# Patient Record
Sex: Female | Born: 1965 | Race: White | Hispanic: No | Marital: Married | Smoking: Current every day smoker
Health system: Southern US, Community
[De-identification: ages and names within clinical notes are randomized; demographics above are authoritative.]

## PROBLEM LIST (undated history)

## (undated) DIAGNOSIS — K5000 Crohn's disease of small intestine without complications: Secondary | ICD-10-CM

## (undated) DIAGNOSIS — I201 Angina pectoris with documented spasm: Secondary | ICD-10-CM

## (undated) DIAGNOSIS — K922 Gastrointestinal hemorrhage, unspecified: Secondary | ICD-10-CM

## (undated) DIAGNOSIS — E039 Hypothyroidism, unspecified: Secondary | ICD-10-CM

## (undated) DIAGNOSIS — Z720 Tobacco use: Secondary | ICD-10-CM

## (undated) DIAGNOSIS — G4730 Sleep apnea, unspecified: Secondary | ICD-10-CM

## (undated) DIAGNOSIS — F329 Major depressive disorder, single episode, unspecified: Secondary | ICD-10-CM

## (undated) DIAGNOSIS — I10 Essential (primary) hypertension: Secondary | ICD-10-CM

## (undated) DIAGNOSIS — N189 Chronic kidney disease, unspecified: Secondary | ICD-10-CM

## (undated) DIAGNOSIS — D690 Allergic purpura: Secondary | ICD-10-CM

## (undated) DIAGNOSIS — I2510 Atherosclerotic heart disease of native coronary artery without angina pectoris: Secondary | ICD-10-CM

## (undated) DIAGNOSIS — K219 Gastro-esophageal reflux disease without esophagitis: Secondary | ICD-10-CM

## (undated) DIAGNOSIS — J45909 Unspecified asthma, uncomplicated: Secondary | ICD-10-CM

## (undated) DIAGNOSIS — F32A Depression, unspecified: Secondary | ICD-10-CM

## (undated) DIAGNOSIS — G43909 Migraine, unspecified, not intractable, without status migrainosus: Secondary | ICD-10-CM

## (undated) DIAGNOSIS — F909 Attention-deficit hyperactivity disorder, unspecified type: Secondary | ICD-10-CM

## (undated) DIAGNOSIS — F419 Anxiety disorder, unspecified: Secondary | ICD-10-CM

---
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
## 1989-08-06 HISTORY — PX: DILATION AND CURETTAGE OF UTERUS: SHX78

## 1998-01-30 ENCOUNTER — Ambulatory Visit (HOSPITAL_COMMUNITY): Admission: RE | Admit: 1998-01-30 | Discharge: 1998-01-30 | Payer: Self-pay | Admitting: Obstetrics and Gynecology

## 1998-06-24 ENCOUNTER — Inpatient Hospital Stay (HOSPITAL_COMMUNITY): Admission: AD | Admit: 1998-06-24 | Discharge: 1998-06-27 | Payer: Self-pay | Admitting: Obstetrics and Gynecology

## 1998-07-12 ENCOUNTER — Encounter (HOSPITAL_COMMUNITY): Admission: RE | Admit: 1998-07-12 | Discharge: 1998-10-10 | Payer: Self-pay | Admitting: Obstetrics and Gynecology

## 1999-08-05 ENCOUNTER — Other Ambulatory Visit: Admission: RE | Admit: 1999-08-05 | Discharge: 1999-08-05 | Payer: Self-pay | Admitting: Obstetrics and Gynecology

## 1999-11-04 ENCOUNTER — Ambulatory Visit (HOSPITAL_COMMUNITY): Admission: RE | Admit: 1999-11-04 | Discharge: 1999-11-04 | Payer: Self-pay | Admitting: Internal Medicine

## 1999-11-05 ENCOUNTER — Encounter: Payer: Self-pay | Admitting: Emergency Medicine

## 2000-09-16 ENCOUNTER — Other Ambulatory Visit: Admission: RE | Admit: 2000-09-16 | Discharge: 2000-09-16 | Payer: Self-pay | Admitting: Obstetrics and Gynecology

## 2001-02-19 ENCOUNTER — Ambulatory Visit (HOSPITAL_COMMUNITY): Admission: RE | Admit: 2001-02-19 | Discharge: 2001-02-19 | Payer: Self-pay | Admitting: Internal Medicine

## 2001-02-19 ENCOUNTER — Encounter: Payer: Self-pay | Admitting: Internal Medicine

## 2002-03-07 ENCOUNTER — Encounter: Admission: RE | Admit: 2002-03-07 | Discharge: 2002-03-07 | Payer: Self-pay | Admitting: Internal Medicine

## 2002-03-07 ENCOUNTER — Encounter: Payer: Self-pay | Admitting: Internal Medicine

## 2002-03-26 ENCOUNTER — Encounter: Payer: Self-pay | Admitting: Obstetrics and Gynecology

## 2002-03-26 ENCOUNTER — Encounter: Admission: RE | Admit: 2002-03-26 | Discharge: 2002-03-26 | Payer: Self-pay | Admitting: Obstetrics and Gynecology

## 2002-08-29 ENCOUNTER — Encounter: Payer: Self-pay | Admitting: Obstetrics and Gynecology

## 2002-08-29 ENCOUNTER — Encounter: Admission: RE | Admit: 2002-08-29 | Discharge: 2002-08-29 | Payer: Self-pay | Admitting: Obstetrics and Gynecology

## 2002-09-06 ENCOUNTER — Other Ambulatory Visit: Admission: RE | Admit: 2002-09-06 | Discharge: 2002-09-06 | Payer: Self-pay | Admitting: Obstetrics and Gynecology

## 2002-10-16 ENCOUNTER — Encounter (INDEPENDENT_AMBULATORY_CARE_PROVIDER_SITE_OTHER): Payer: Self-pay | Admitting: *Deleted

## 2002-10-16 ENCOUNTER — Ambulatory Visit (HOSPITAL_COMMUNITY): Admission: RE | Admit: 2002-10-16 | Discharge: 2002-10-16 | Payer: Self-pay | Admitting: Obstetrics and Gynecology

## 2003-09-19 ENCOUNTER — Other Ambulatory Visit: Admission: RE | Admit: 2003-09-19 | Discharge: 2003-09-19 | Payer: Self-pay | Admitting: Obstetrics and Gynecology

## 2004-09-18 ENCOUNTER — Encounter: Admission: RE | Admit: 2004-09-18 | Discharge: 2004-09-18 | Payer: Self-pay | Admitting: Internal Medicine

## 2004-10-01 ENCOUNTER — Other Ambulatory Visit: Admission: RE | Admit: 2004-10-01 | Discharge: 2004-10-01 | Payer: Self-pay | Admitting: Obstetrics and Gynecology

## 2005-04-02 ENCOUNTER — Ambulatory Visit: Payer: Self-pay | Admitting: Internal Medicine

## 2005-04-21 ENCOUNTER — Ambulatory Visit (HOSPITAL_BASED_OUTPATIENT_CLINIC_OR_DEPARTMENT_OTHER): Admission: RE | Admit: 2005-04-21 | Discharge: 2005-04-21 | Payer: Self-pay | Admitting: Internal Medicine

## 2005-04-26 ENCOUNTER — Ambulatory Visit: Payer: Self-pay | Admitting: Internal Medicine

## 2005-05-13 ENCOUNTER — Ambulatory Visit: Payer: Self-pay | Admitting: Internal Medicine

## 2005-06-03 ENCOUNTER — Ambulatory Visit: Payer: Self-pay | Admitting: Internal Medicine

## 2005-08-22 ENCOUNTER — Ambulatory Visit (HOSPITAL_COMMUNITY): Admission: RE | Admit: 2005-08-22 | Discharge: 2005-08-22 | Payer: Self-pay | Admitting: Emergency Medicine

## 2005-08-28 ENCOUNTER — Ambulatory Visit (HOSPITAL_COMMUNITY): Admission: RE | Admit: 2005-08-28 | Discharge: 2005-08-28 | Payer: Self-pay | Admitting: Emergency Medicine

## 2005-09-08 ENCOUNTER — Ambulatory Visit (HOSPITAL_COMMUNITY): Admission: RE | Admit: 2005-09-08 | Discharge: 2005-09-08 | Payer: Self-pay | Admitting: Internal Medicine

## 2005-09-09 ENCOUNTER — Encounter: Admission: RE | Admit: 2005-09-09 | Discharge: 2005-09-09 | Payer: Self-pay | Admitting: Internal Medicine

## 2005-09-14 ENCOUNTER — Ambulatory Visit (HOSPITAL_COMMUNITY): Admission: RE | Admit: 2005-09-14 | Discharge: 2005-09-14 | Payer: Self-pay

## 2005-09-15 ENCOUNTER — Inpatient Hospital Stay (HOSPITAL_COMMUNITY): Admission: EM | Admit: 2005-09-15 | Discharge: 2005-09-27 | Payer: Self-pay | Admitting: Emergency Medicine

## 2005-09-15 ENCOUNTER — Ambulatory Visit: Payer: Self-pay | Admitting: Internal Medicine

## 2005-09-16 ENCOUNTER — Encounter (INDEPENDENT_AMBULATORY_CARE_PROVIDER_SITE_OTHER): Payer: Self-pay | Admitting: Specialist

## 2005-09-17 ENCOUNTER — Ambulatory Visit: Payer: Self-pay | Admitting: Internal Medicine

## 2005-09-17 ENCOUNTER — Encounter: Payer: Self-pay | Admitting: Internal Medicine

## 2005-09-17 ENCOUNTER — Ambulatory Visit: Payer: Self-pay | Admitting: Oncology

## 2005-09-18 ENCOUNTER — Ambulatory Visit: Payer: Self-pay | Admitting: Pulmonary Disease

## 2005-10-03 ENCOUNTER — Inpatient Hospital Stay (HOSPITAL_COMMUNITY): Admission: EM | Admit: 2005-10-03 | Discharge: 2005-10-06 | Payer: Self-pay | Admitting: Emergency Medicine

## 2005-11-16 ENCOUNTER — Ambulatory Visit (HOSPITAL_COMMUNITY): Admission: RE | Admit: 2005-11-16 | Discharge: 2005-11-16 | Payer: Self-pay | Admitting: Gastroenterology

## 2005-11-22 ENCOUNTER — Encounter: Admission: RE | Admit: 2005-11-22 | Discharge: 2006-02-20 | Payer: Self-pay | Admitting: Internal Medicine

## 2005-11-23 ENCOUNTER — Other Ambulatory Visit: Admission: RE | Admit: 2005-11-23 | Discharge: 2005-11-23 | Payer: Self-pay | Admitting: Obstetrics and Gynecology

## 2006-02-21 ENCOUNTER — Other Ambulatory Visit: Admission: RE | Admit: 2006-02-21 | Discharge: 2006-02-21 | Payer: Self-pay | Admitting: Obstetrics and Gynecology

## 2006-03-03 ENCOUNTER — Encounter: Admission: RE | Admit: 2006-03-03 | Discharge: 2006-03-03 | Payer: Self-pay | Admitting: Internal Medicine

## 2006-05-09 ENCOUNTER — Ambulatory Visit (HOSPITAL_COMMUNITY): Admission: RE | Admit: 2006-05-09 | Discharge: 2006-05-09 | Payer: Self-pay | Admitting: Internal Medicine

## 2006-08-30 ENCOUNTER — Other Ambulatory Visit: Admission: RE | Admit: 2006-08-30 | Discharge: 2006-08-30 | Payer: Self-pay | Admitting: Obstetrics and Gynecology

## 2006-09-23 ENCOUNTER — Ambulatory Visit (HOSPITAL_COMMUNITY): Admission: RE | Admit: 2006-09-23 | Discharge: 2006-09-23 | Payer: Self-pay | Admitting: Internal Medicine

## 2007-02-27 ENCOUNTER — Other Ambulatory Visit: Admission: RE | Admit: 2007-02-27 | Discharge: 2007-02-27 | Payer: Self-pay | Admitting: Obstetrics and Gynecology

## 2007-05-08 ENCOUNTER — Encounter: Admission: RE | Admit: 2007-05-08 | Discharge: 2007-05-08 | Payer: Self-pay | Admitting: Internal Medicine

## 2007-08-28 ENCOUNTER — Other Ambulatory Visit: Admission: RE | Admit: 2007-08-28 | Discharge: 2007-08-28 | Payer: Self-pay | Admitting: Obstetrics and Gynecology

## 2008-02-29 ENCOUNTER — Other Ambulatory Visit: Admission: RE | Admit: 2008-02-29 | Discharge: 2008-02-29 | Payer: Self-pay | Admitting: Obstetrics and Gynecology

## 2008-05-16 ENCOUNTER — Encounter: Admission: RE | Admit: 2008-05-16 | Discharge: 2008-05-16 | Payer: Self-pay | Admitting: Obstetrics and Gynecology

## 2008-08-19 ENCOUNTER — Inpatient Hospital Stay (HOSPITAL_COMMUNITY): Admission: EM | Admit: 2008-08-19 | Discharge: 2008-08-21 | Payer: Self-pay | Admitting: *Deleted

## 2009-02-20 ENCOUNTER — Emergency Department (HOSPITAL_COMMUNITY): Admission: EM | Admit: 2009-02-20 | Discharge: 2009-02-20 | Payer: Self-pay | Admitting: Family Medicine

## 2009-02-25 ENCOUNTER — Ambulatory Visit: Payer: Self-pay | Admitting: Internal Medicine

## 2009-03-04 ENCOUNTER — Other Ambulatory Visit: Admission: RE | Admit: 2009-03-04 | Discharge: 2009-03-04 | Payer: Self-pay | Admitting: Obstetrics and Gynecology

## 2009-03-18 ENCOUNTER — Ambulatory Visit: Payer: Self-pay | Admitting: Internal Medicine

## 2009-05-27 ENCOUNTER — Encounter: Admission: RE | Admit: 2009-05-27 | Discharge: 2009-05-27 | Payer: Self-pay | Admitting: Internal Medicine

## 2009-07-22 ENCOUNTER — Ambulatory Visit: Payer: Self-pay | Admitting: Internal Medicine

## 2009-08-29 ENCOUNTER — Ambulatory Visit: Payer: Self-pay | Admitting: Internal Medicine

## 2009-12-22 ENCOUNTER — Ambulatory Visit: Payer: Self-pay | Admitting: Internal Medicine

## 2010-01-05 ENCOUNTER — Ambulatory Visit: Payer: Self-pay | Admitting: Internal Medicine

## 2010-02-23 ENCOUNTER — Ambulatory Visit: Payer: Self-pay | Admitting: Internal Medicine

## 2010-03-03 ENCOUNTER — Ambulatory Visit: Payer: Self-pay | Admitting: Internal Medicine

## 2010-03-03 ENCOUNTER — Other Ambulatory Visit: Admission: RE | Admit: 2010-03-03 | Discharge: 2010-03-03 | Payer: Self-pay | Admitting: Obstetrics and Gynecology

## 2010-06-25 ENCOUNTER — Ambulatory Visit: Payer: Self-pay | Admitting: Internal Medicine

## 2010-06-25 ENCOUNTER — Encounter: Admission: RE | Admit: 2010-06-25 | Discharge: 2010-06-25 | Payer: Self-pay | Admitting: Internal Medicine

## 2010-08-17 ENCOUNTER — Ambulatory Visit: Payer: Self-pay | Admitting: Internal Medicine

## 2010-08-25 ENCOUNTER — Ambulatory Visit (HOSPITAL_COMMUNITY): Admission: RE | Admit: 2010-08-25 | Discharge: 2010-08-25 | Payer: Self-pay | Admitting: Obstetrics & Gynecology

## 2010-09-10 ENCOUNTER — Ambulatory Visit: Payer: Self-pay | Admitting: Internal Medicine

## 2010-12-14 ENCOUNTER — Ambulatory Visit
Admission: RE | Admit: 2010-12-14 | Discharge: 2010-12-14 | Payer: Self-pay | Source: Home / Self Care | Attending: Internal Medicine | Admitting: Internal Medicine

## 2010-12-26 ENCOUNTER — Encounter: Payer: Self-pay | Admitting: Internal Medicine

## 2011-01-29 ENCOUNTER — Ambulatory Visit (INDEPENDENT_AMBULATORY_CARE_PROVIDER_SITE_OTHER): Payer: Commercial Managed Care - PPO | Admitting: Internal Medicine

## 2011-01-29 DIAGNOSIS — N912 Amenorrhea, unspecified: Secondary | ICD-10-CM

## 2011-02-18 LAB — CBC
HCT: 45.2 % (ref 36.0–46.0)
Hemoglobin: 15.3 g/dL — ABNORMAL HIGH (ref 12.0–15.0)
MCH: 32.5 pg (ref 26.0–34.0)
MCHC: 33.8 g/dL (ref 30.0–36.0)
MCV: 96.2 fL (ref 78.0–100.0)
Platelets: 175 10*3/uL (ref 150–400)
RBC: 4.7 MIL/uL (ref 3.87–5.11)
RDW: 12.5 % (ref 11.5–15.5)
WBC: 12 10*3/uL — ABNORMAL HIGH (ref 4.0–10.5)

## 2011-03-19 ENCOUNTER — Ambulatory Visit (INDEPENDENT_AMBULATORY_CARE_PROVIDER_SITE_OTHER): Payer: Commercial Managed Care - PPO | Admitting: Internal Medicine

## 2011-03-19 ENCOUNTER — Other Ambulatory Visit: Payer: Self-pay | Admitting: Internal Medicine

## 2011-03-19 DIAGNOSIS — F411 Generalized anxiety disorder: Secondary | ICD-10-CM

## 2011-03-19 DIAGNOSIS — M542 Cervicalgia: Secondary | ICD-10-CM

## 2011-03-19 DIAGNOSIS — F988 Other specified behavioral and emotional disorders with onset usually occurring in childhood and adolescence: Secondary | ICD-10-CM

## 2011-03-19 DIAGNOSIS — M5410 Radiculopathy, site unspecified: Secondary | ICD-10-CM

## 2011-03-19 DIAGNOSIS — I10 Essential (primary) hypertension: Secondary | ICD-10-CM

## 2011-03-19 DIAGNOSIS — M79602 Pain in left arm: Secondary | ICD-10-CM

## 2011-03-24 ENCOUNTER — Inpatient Hospital Stay (HOSPITAL_COMMUNITY): Admission: RE | Admit: 2011-03-24 | Payer: Commercial Managed Care - PPO | Source: Ambulatory Visit

## 2011-03-26 ENCOUNTER — Ambulatory Visit (HOSPITAL_COMMUNITY)
Admission: RE | Admit: 2011-03-26 | Discharge: 2011-03-26 | Disposition: A | Discharge status: Home / Self Care | Payer: 59 | Source: Ambulatory Visit | Attending: Internal Medicine | Admitting: Internal Medicine

## 2011-03-26 DIAGNOSIS — M5410 Radiculopathy, site unspecified: Secondary | ICD-10-CM

## 2011-03-26 DIAGNOSIS — M519 Unspecified thoracic, thoracolumbar and lumbosacral intervertebral disc disorder: Secondary | ICD-10-CM | POA: Insufficient documentation

## 2011-03-26 DIAGNOSIS — M47812 Spondylosis without myelopathy or radiculopathy, cervical region: Secondary | ICD-10-CM | POA: Insufficient documentation

## 2011-03-26 DIAGNOSIS — M542 Cervicalgia: Secondary | ICD-10-CM | POA: Insufficient documentation

## 2011-03-26 DIAGNOSIS — M25519 Pain in unspecified shoulder: Secondary | ICD-10-CM | POA: Insufficient documentation

## 2011-03-26 DIAGNOSIS — M5412 Radiculopathy, cervical region: Secondary | ICD-10-CM | POA: Insufficient documentation

## 2011-03-26 DIAGNOSIS — M79602 Pain in left arm: Secondary | ICD-10-CM

## 2011-03-26 DIAGNOSIS — R209 Unspecified disturbances of skin sensation: Secondary | ICD-10-CM | POA: Insufficient documentation

## 2011-03-26 DIAGNOSIS — M4802 Spinal stenosis, cervical region: Secondary | ICD-10-CM | POA: Insufficient documentation

## 2011-03-26 DIAGNOSIS — M79609 Pain in unspecified limb: Secondary | ICD-10-CM | POA: Insufficient documentation

## 2011-03-26 DIAGNOSIS — J3489 Other specified disorders of nose and nasal sinuses: Secondary | ICD-10-CM | POA: Insufficient documentation

## 2011-04-20 NOTE — H&P (Signed)
NAME:  [name], [name] NO.:  [id number]   MEDICAL RECORD NO.:  [id number]          PATIENT TYPE:  INP   LOCATION:  1825                         FACILITY:  [facility]   PHYSICIAN:  Vania Rea, M.D. DATE OF BIRTH:  1966/09/04   DATE OF ADMISSION:  08/19/2008  DATE OF DISCHARGE:                              HISTORY & PHYSICAL   PRIMARY CARE PHYSICIAN:  Dr. Marlan Palau.   GASTROENTEROLOGIST:  Dr. Matthias Hughs with [facility].   CHIEF COMPLAINT:  Vomiting and diarrhea since yesterday.   HISTORY OF PRESENT ILLNESS:  This is a 45 year old Caucasian lady who  works as a Engineer, civil (consulting) in urgent care and has a history of vasculitis and  previous admission 2 years ago for ileocolitis related to her  vasculitis.  Has been fairly well until a week ago when she developed an  upper respiratory infection, went to see her primary care physician who  prescribed Biaxin as well as Advair and albuterol MDI and gave her 5-day  steroid taper.  Steroid taper was completed yesterday but yesterday also  patient began to feel nauseous while at work and having some epigastric  pain.  She subsequently went home and began having diarrhea which  proceeded to watery mucusy and bloody diarrhea associated with  persistent vomiting.  Patient came to the emergency room and continued  to have vomiting and bloody diarrhea in the emergency room and was noted  to be markedly dehydrated.  A hospitalists service was called to assist  in management.  Of note, the patient has a co-worker who has also been  complaining of nausea.  Lives with husband and children and they are not  having any GI symptoms.  So far, patient has had 3-1/2 L of saline and  is feeling much improved.  She has noted no fever, no chest pains, no  shortness of breath, no cough, no dysuria, no joint pain.   PAST MEDICAL HISTORY:  1. Hypertension.  2. Hypothyroidism.  3. Vasculitis.  4. GERD.   MEDICATIONS:  1. Enalapril 10 mg daily.  2.  Synthroid 75 mcg daily.  3. Prilosec 20 mg twice daily.  4. Biaxin 500 mg twice daily.  5. Albuterol MDI 4 times daily p.r.n.  6. Advair by inhalation b.i.d.   ALLERGIES:  No known drug allergies.   SOCIAL HISTORY:  Lives with husband and children.  Works as a Engineer, civil (consulting) in  urgent care.  Smokes 1/2 pack-per-day.  Denies alcohol or illicit drug  use.   FAMILY HISTORY:  No family history of GI cancers.   REVIEW OF SYSTEMS:  Other than noted above, a 10-point review of systems  is unremarkable.   PHYSICAL EXAMINATION:  Pleasant young Caucasian lady lying in the  stretcher in no acute distress at this time.  VITAL SIGNS:  Temperature is 97.3.  Pulse 82.  Respiration 18.  Blood  pressure 99/59.  She is saturating at 96% on room air.  Pupils are round and equal.  Mucous membranes pink and anicteric.  She  is somewhat __________.  She has no cervical lymphadenopathy or  thyromegaly.  No jugular venous distention.  CHEST:  She has diffuse  mild rhonchi bilaterally.  CARDIOVASCULAR SYSTEM:  Regular rhythm.  ABDOMEN:  Soft and nontender.  She has increased bowel sounds.  EXTREMITIES:  Without edema.  She has 2+ pulses bilaterally.  CENTRAL NERVOUS SYSTEM:  Cranial nerves II through XII are grossly  intact.  She has not focal neurological deficits.  SKIN:  On her skin, she has no evidence of purpura or other vasculitic  manifestations.   LABORATORY:  Her white count is 27,000.  Hemoglobin is 16.9.  Hematocrit  51.5.  MCV 86.8.  Platelets 269.  She has 90% neutrophils.  Absolute  reticulocyte count 24.6.  Her sodium is 135, potassium 3.4, chloride  106, CO2 20, BUN 21, creatinine 0.8 and liver function test  unremarkable.  Her glucose is 142.  Her lipase is 22.   ASSESSMENT:  1. Gastroenteritis.  Differential includes vasculitic gastroenteritis.      Also includes Clostridium difficile because of her occupation and      also includes food poisoning or other causes.  2. She has acute  bronchitis.  3. Leukocytosis, possibly due to her infective or inflammatory colitis      but possibly due to her recent steroid use.  4. History of hypothyroidism.  5. History of gastroesophageal reflux disease.  6. History of hypertension.   PLAN:  We will admit her to stepdown for continued IV fluid hydration  replacement, monitor H and H and transfuse as necessary.  We will  withhold antibiotics and steroids at this time until she has been seen  by a gastroenterologist.  We will do a stool workup including C. diff  and cultures.  Other plans as per orders.      Vania Rea, M.D.  Electronically Signed     LC/MEDQ  D:  08/19/2008  T:  08/19/2008  Job:  [id number]   cc:   [name], M.D.  Bernette Redbird, M.D.

## 2011-04-20 NOTE — Consult Note (Signed)
NAME:  [name], [name] NO.:  [id number]   MEDICAL RECORD NO.:  [id number]          PATIENT TYPE:  INP   LOCATION:  2919                         FACILITY:  [facility]   PHYSICIAN:  Danise Edge, M.D.   DATE OF BIRTH:  1965-12-29   DATE OF CONSULTATION:  08/19/2008  DATE OF DISCHARGE:                                 CONSULTATION   REFERRING PHYSICIAN:  Bernette Redbird, MD   PROBLEMS:  Acute nausea, vomiting, and bloody diarrhea.   HISTORY:  [name] is a 45 year old female born on  [birth year].  [name] was admitted to [facility] to  evaluate and treat acute vomiting with bloody diarrhea.  One week prior  to admission, she was prescribed Biaxin, Advair, albuterol, and a  steroid taper to treat an upper respiratory tract infection associated  with wheezing.   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   [name] works at the [employer].  She ate a takeout while at work yesterday.  After her shift  completed, she drove home and first developed a rather intense  epigastric discomfort which was followed by nausea, nonbloody vomiting,  and diarrhea associated with pinkish fluid and mucus.   Since admission to the hospital and receiving intravenous fluids, she is  feeling much better.  Her pain has resolved.  Her nausea and vomiting  has resolved.  Her diarrhea is less frequent.   In 2006, she did undergo an esophagogastroduodenoscopy and colonoscopy  performed by Dr. Vida Rigger, which revealed a normal upper endoscopy and  inflamed terminal ileum which did not reveal granulomas by biopsy.  There was no colorectal neoplasia.   [name]' CBC revealed hemoconcentration and a marked  elevation in her white blood cell count.   ALLERGIES TO MEDICATIONS:  None.   CURRENT MEDICATIONS:  Protonix, morphine, fentanyl, and Zofran.   PAST MEDICAL HISTORY:  1. Mild obstructive sleep apnea.  2. Resolved Henoch-Schonlein purpura, 2006.  3. Gastroesophageal reflux disease.  4. Asthma.  5. Migraine syndrome.  6. Normal esophagogastroduodenoscopy, 2006.  7. Inflamed terminal ileum by colonoscopy in 2006 due to Henoch-      Schonlein purpura.  8. Appendectomy.  9. Three cesarean sections.  10.Hysteroscopy with polypectomy.  11.Hypothyroidism.  12.Hypertension.   HABITS:  The patient smokes 1/2-pack of cigarettes per day, but does not  consume alcohol.   PHYSICAL EXAMINATION:  GENERAL APPEARANCE:  The patient appears quite  comfortable.  She is alert and oriented.  HEENT:  Nonicteric sclerae.  LUNGS:  Clear to auscultation.  CARDIAC:  Regular rhythm without murmurs.  ABDOMEN:  Soft, flat, and nontender.   ASSESSMENT:  1. Acute epigastric pain with vomiting and bloody diarrhea with mucus,      rule out infectious process.  2. Resolved Henoch-Schonlein purpura with nephropathy, ileitis, and      leukoclastic vasculitis in 2006.   RECOMMENDATIONS:  1. The patient does not require any diagnostic gastrointestinal      endoscopy at this time.  2. Appropriate stool studies have been ordered and I await their      results.           ______________________________  Danise Edge, M.D.     MJ/MEDQ  D:  08/19/2008  T:  08/20/2008  Job:  [id number]   cc:   Bernette Redbird, M.D.

## 2011-04-20 NOTE — Discharge Summary (Signed)
NAMEMICHELE, [name]     ACCOUNT NO.:  [id number]   MEDICAL RECORD NO.:  [id number]          PATIENT TYPE:  INP   LOCATION:  3017                         FACILITY:  [facility]   PHYSICIAN:  Isidor Holts, M.D.  DATE OF BIRTH:  08-Mar-1966   DATE OF ADMISSION:  08/19/2008  DATE OF DISCHARGE:  08/21/2008                               DISCHARGE SUMMARY   PRIMARY MEDICAL DOCTOR:  Dr. Luanna Cole. Baxley.   PRIMARY GASTROENTEROLOGIST:  Dr. Matthias Hughs.   DISCHARGE DIAGNOSES:  1. Acute gastroenteritis, secondary to Clostridium difficile.  2. Mild dehydration/volume depletion, secondary to #1.  3. Recent acute bronchitis.  4. Dysthyroidism.  5. History of hypertension.  6. History of Henoch-Schonlein purpura.  7. Gastroesophageal reflux disease.  8. Mild obstructive sleep apnea syndrome.  9. Probable history of bronchial asthma/chronic obstructive pulmonary      disease.  10.Migraines.   DISCHARGE MEDICATIONS:  1. Synthroid 75 mcg p.o. daily.  2. Prilosec 20 mg p.o. b.i.d.  3. Advair Diskus (100/50) one puff b.i.d.  4. Albuterol MDI p.r.n. q.i.d.  5. Enalapril 10 mg p.o. daily, to be recommenced on August 26, 2008.  Otherwise as indicated by primary M.D.  6. Flagyl 500 mg p.o. t.i.d. for 10 days only.   Note:  Prednisone and Biaxin have been discontinued.   PROCEDURE:  Abdominal/chest x-ray dated 08/19/2008.  This showed no  acute cardiopulmonary process or evidence for bowel  obstruction/perforation.   CONSULTATIONS:  Dr. Danise Edge, gastroenterologist.   ADMISSION HISTORY:  As in H&P notes of 08/19/2008, dictated by Dr.  Vania Lam.  However, in brief, this is a 45 year old female, with  known history of hypertension, hypothyroidism, Henoch-Schonlein purpura  in the past, GERD, obstructive sleep apnea syndrome, probable bronchial  asthma, migraines, presenting with vomiting and bloody diarrhea.  Reportedly, about a week prior to admission, she had developed  an upper  respiratory tract infection and was managed with a combination of  bronchodilators, Biaxin and steroid taper by her primary M.D.  She was  admitted further evaluation, investigation and management.   CLINICAL COURSE:  1. Acute gastroenteritis.  This was secondary to C difficile.  The      patient appeared volume depleted/dehydrated at time of initial      presentation with a BUN of 21, creatinine 0.8, BP 99/59.  She was      managed with intravenous fluid hydration with satisfactory clinical      response.  By 08/20/2008, BP had normalized at 121/56.  Hydration      status was satisfactory.  BUN was 9, creatinine 0.7.  In view of      above, pre-admission antihypertensive medication was held.  Stool      studies were sent off and were positive for C. difficile toxin.      The patient was therefore commenced on oral Flagyl on 08/21/2008,      to complete a 10-day course.   1. Dysthyroidism.  The patient was continued on pre-admission dosage      of Synthroid.   1. GERD.  The patient was managed with proton pump inhibitor.  There  were no problems referable to this.   1. Recent acute bronchitis.  The patient's chest x-ray was devoid of      any acute changes, and there were no respiratory tract issues      during the course of the patient's hospitalization.  Likely she had      a recent acute bronchitis, which has since resolved.  Biaxin has,      therefore, been discontinued.   1. History of migraines.  There were no problems referable to this.   DISPOSITION:  The patient was, on 08/21/2008 asymptomatic for 24 hours,  was tolerating oral intake, including food and drink, without any  deleterious effects and was very keen to be discharged.  She was,  therefore, discharged accordingly.  She is recommended to return to  regular duties on 08/24/2008.   DIET:  Heart healthy/low residue.   ACTIVITY:  As tolerated.  However, recommended to increase activity  slowly.    FOLLOW-UP INSTRUCTIONS:  The patient is recommended to return to her  primary M.D., Dr. Luanna Cole. [place], routinely, per prior scheduled  appointment.   SPECIAL INSTRUCTIONS:  The patient has been recommended to hold her  Enalapril until 08/26/2008, unless blood pressure shows an increase  before then.  All this has been communicated to the patient.  She has  verbalized understanding.      Isidor Holts, M.D.  Electronically Signed     CO/MEDQ  D:  08/21/2008  T:  08/21/2008  Job:  [id number]   cc:   [name], M.D.

## 2011-04-23 NOTE — Op Note (Signed)
[name], [name]     ACCOUNT NO.:  [id number]   MEDICAL RECORD NO.:  [id number]          PATIENT TYPE:  INP   LOCATION:  6741                         FACILITY:  [facility]   PHYSICIAN:  Petra Kuba, M.D.    DATE OF BIRTH:  1966-01-11   DATE OF PROCEDURE:  09/16/2005  DATE OF DISCHARGE:                                 OPERATIVE REPORT   PROCEDURE:  Esophagogastroduodenoscopy.   ENDOSCOPIST:  Petra Kuba, M.D.   INDICATION:  Upper abdominal pain, GI bleeding.  Consent was signed after  risks, benefits, methods and options were thoroughly discussed prior to any  premeds given.   MEDICINES USED:  Demerol 75 mg, Versed 7.5 mg.   PROCEDURE:  The video endoscope was inserted by direct vision; her esophagus  was normal.  She did have a liter of fluid in her stomach which almost all  was suctioned prior to proceeding with the endoscopy.  Once we suctioned all  the fluid, we went ahead and advanced through a normal antrum and normal  pylorus.  The duodenal bulb was moderately inflamed.  There was some  erythema, but no obvious ulceration.  Scope passed around this area to a  normal second portion of the duodenum.  Scope was withdrawn back to the  bulb; no frank ulceration was seen, but some moderate inflammation, as  mentioned above; photo-documentation was obtained.  The stomach was  evaluated on straight and retroflexed visualization.  We could not suction  all of the liquid, so lesions underlying could have been missed, but no  obvious lesion was seen.  She did not like having the scope in her mouth and  we quickly finished the endoscopy.  A quick look at the esophagus was  normal.  Scope was removed.  The patient tolerated the procedure fair.  There was no obvious immediate complication.   ENDOSCOPIC DIAGNOSES:  1.  Small hiatal hernia.  2.  Increased food and liquid in the stomach, most of it suctioned,      approximately 1 L.  3.  Moderate bulbitis.  4.  Otherwise  normal esophagogastroduodenoscopy.   PLAN:  Proceed with a colonoscopy, continue pump inhibitors; please see that  dictation for details.           ______________________________  Petra Kuba, M.D.     MEM/MEDQ  D:  09/16/2005  T:  09/17/2005  Job:  [id number]   cc:   [name], M.D.  Fax: [phone]   Areatha Keas, M.D.  Fax: [phone]

## 2011-04-23 NOTE — H&P (Signed)
NAME:  [name], [name]     ACCOUNT NO.:  [id number]   MEDICAL RECORD NO.:  [id number]          PATIENT TYPE:  INP   LOCATION:  2004                         FACILITY:  [facility]   PHYSICIAN:  [name], M.D.   DATE OF BIRTH:  [birth year]   DATE OF ADMISSION:  10/03/2005  DATE OF DISCHARGE:                                HISTORY & PHYSICAL   BRIEF HISTORY:  This 45 year old female was discharged from [facility] September 27, 2005 after having been admitted September 15, 2005 with  acute severe abdominal pain and vasculitis.  Back in mid-September 2006, she  developed desquamation of her fingers after her palms turned red and itchy.  The same thing happened to the soles of her feet.  She developed pain in her  wrist.  She was seen by Dr. Merlyn Lot, hand surgeon, who thought she had carpal  tunnel syndrome and placed her on prednisone.  I thought she was having an  allergic reaction to Wellbutrin and stopped the medication.  She  subsequently developed purpuric lesions on her lower extremities.  I had her  seen by dermatologist Dr. 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  The patient was placed  again on prednisone.  The only new medications she has been on recently  since June 2006 are Wellbutrin for fatigue and depression and Vivactil for  daytime somnolence and mild sleep apnea.  During the hospitalization of  September 15, 2005, she was seen by Dr. Ewing Schlein and underwent a colonoscopy, and  biopsy showed according to the pathologist and the GI physician ischemia  more than inflammation.  They noted she had terminal ileitis on CT scan.  There is no history of inflammatory bowel disease and no family history of  inflammatory bowel disease.  On September 17, 2005, she developed rectal  bleeding and severe abdominal pain.  All consulting physicians were worried  about ischemic bowel.  She had an MRA that showed no evidence of  thrombosis  in the major GI vessels.  She was transferred to the intensive care unit.  Hematology consult was obtained.  She was given IVIG to help with the  vasculitic process.  Was not felt to be in DIC.  She never required  transfusion.  Hemoglobin stabilized.  Bleeding continued for several days  but lessened in amount.  She was seen by critical care physicians.  She was  managed with normal saline IV.  She was placed on high-dose steroids, Solu-  Medrol 80 mg q.8 h.  When she was admitted, she had been on Solu-Medrol 80  mg daily.  Slowly, the patient began to improve, and she was transferred out  of the ICU and to a telemetry floor.  Her diet was advanced very slowly, and  she did well.  She had a Dilaudid PCA pump for abdominal pain.  All of this  frightened her.  She was quite alarmed by any change in her clinical  condition.  She developed some pain in her right elbow.  An MRI was done and  showed an effusion.  She developed pain and  swelling in her right calf, and  Doppler to the lower extremities showed no evidence of DVT.  She definitely  had tenderness in the medial right calf and noticeable swelling.  She was  only given one dose of Lovenox overnight until the lower extremity Doppler  could be obtained.  Ultrasound was negative for gallstones.  On admission.  October 03, 2005, she has mild elevation in SGOT and SGPT.  She had been  discharged home September 27, 2005 on prednisone 60 mg daily.  Local  rheumatologist, Dr. Areatha Keas, had agreed to see her on October 06, 2005.  He was in contact with me by telephone when I would call for advice, but  actually consulted on her only once, September 17, 2005.  He did not feel the  patient needed Cytoxan but felt that she would probably improve on  prednisone.  However, on the day of admission, October 03, 2005, she  awakened with some rather severe abdominal pain but had no GI bleeding to  her knowledge.  She had 2 very loose brown  large bowel movements.  She came  to the emergency room in the early morning on October 03, 2005 and was seen  by Dr. Carleene Cooper, ER physician.  He started IV fluids consisting of  normal saline, spoke with the radiologist, who felt another CT of the  abdomen was indicated.  Prior to her discharge on September 25, 2005, we had  done another CT, and it was essentially normal.  This CT done on October 03, 2005 was markedly abnormal.  The radiologist called me to say it showed  terminal ileitis and a diffuse colitis.  She also had marked ascites.  Previous CT when she was admitted on September 15, 2005 showed some mild  ascites and terminal ileitis, but no significant colitis.  Interestingly,  her sed rate on this admission is 25 and had been 0 on her previous  admission.  The patient says that she has had some arthralgias over the past  week.  She actually called the office a couple of times complaining of some  arthralgias in her knees.  She appears to have an effusion in her left knee.  None of the joints are warm or hot.  The rash on her lower extremities seems  a little more diffuse to me.  She had called earlier complaining that the  rash was brighter.  We had checked with a rheumatologist, who indicated as  long as she was not actively bleeding or had more abdominal pain, that it  was probably okay to continue with oral prednisone as an outpatient.  While  in the hospital recently, between September 15, 2005 and September 27, 2005, GI  physicians had placed her on Asacol, but that had been discontinued prior to  her discharge from the hospital.  She was on IV Cipro and IV Flagyl as of  September 17, 2005, and that was continued as an outpatient.  Clostridium  difficile toxin is ordered and is pending.  We have tried to call [facility] today to see if we could transfer her from the emergency  department to their GI medicine service, but presently there is a waiting list, as no beds are  available.  This had been recommended by infectious  disease consultant previously if patient continued to lack improvement.  Her  white blood cell count is within normal limits.  Her potassium on admission  was 3.2, and we have  given her 2 runs of IV potassium 10 mEq/hour for 2  hours.  If necessary, we will hold her overnight until we can hear back from  [facility].           ______________________________  [name], M.D.     MJB/MEDQ  D:  10/04/2005  T:  10/04/2005  Job:  [id number]

## 2011-04-23 NOTE — Consult Note (Signed)
NAME:  [name], [name]     ACCOUNT NO.:  [id number]   MEDICAL RECORD NO.:  [id number]          PATIENT TYPE:  EMS   LOCATION:  MAJO                         FACILITY:  [facility]   PHYSICIAN:  Shirley Friar, MDDATE OF BIRTH:  08-Sep-1966   DATE OF CONSULTATION:  10/03/2005  DATE OF DISCHARGE:                                   CONSULTATION   REQUESTING PHYSICIAN:  [name], M.D.   HISTORY OF PRESENT ILLNESS:  This patient is seen at the request of Dr.  Lenord Fellers for abdominal pain and diarrhea.  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  She  was discharged in improved condition on prednisone 60 mg p.o. daily.  She  reports that she was doing fairly well despite the persistence of the  vasculitic skin lesions up until this morning when she had acute onset of  dull achy abdominal pain along with two loose and watery stools.  There was  no obvious blood in these stools.  Due to the recurrence of her abdominal  pain and diarrhea she came into the emergency department for further  evaluation.  She was set up to see rheumatology as an outpatient and also  was set up for an outpatient visit to [facility] for further  management, but she states that an appointment was not available until  December.  On her presentation here she was afebrile with a temperature of  98.3.  She had a white blood count of 9.3, hemoglobin 13, and platelet count  244.  She has had an abdominal CT scan with contrast here in the emergency  department which showed a severe acute colitis and ileitis with edema, wall  thickening, and wall enhancement.  Her SMV and SMA were noted to be patent  and there was no obstruction, free air, or pneumatosis noted.  During her  previous hospitalization she underwent a colonoscopy and upper endoscopy by  Dr. Ewing Schlein and she had biopsies  done of her terminal ileum which were  concerning for ischemia versus inflammation.  Colonoscopy showed terminal  ileum inflammation and ulceration, otherwise normal colonoscopy with a fair  prep.  Her upper endoscopy showed moderate bulbitis and otherwise normal  EGD.   PAST MEDICAL HISTORY:  1.  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  2.  History of gastrointestinal bleed secondary to #1.  3.  Hypertension.  4.  Gastroesophageal reflux disease.  5.  Terminal ileitis.  6.  History of asthma.  7.  Sleep apnea.   PHYSICAL EXAMINATION:  VITAL SIGNS:  Temperature 98.3, pulse 104, blood  pressure 144/64, respirations 18.  GENERAL:  Alert and oriented, in no acute distress.  SKIN:  Diffuse vasculitic lesions, nonblanching on her extremities and  abdomen.  HEENT:  Nonicteric sclerae.  Oropharynx clear.  NECK:  No lymphadenopathy.  CHEST:  Clear to auscultation bilaterally.  CARDIOVASCULAR:  Regular rate and rhythm without murmurs.  ABDOMEN:  Tender in right lower quadrant, otherwise nontender, positive  guarding in right lower quadrant.  Otherwise no guarding, no rebound, soft,  nondistended, active bowel sounds.  EXTREMITIES:  No pitting edema.   LABORATORY DATA:  White blood count 9.3, hemoglobin 13, hematocrit 38,  platelet count 244.  Sodium 136, potassium 3.2, chloride 104, CO2 27, BUN  13, creatinine 0.7, glucose 106.  Calcium 7.5, total protein 4.5, albumin  1.8, AST 64, ALT 83, ALP 49, T bili 0.6, lipase 19.   Abdominal CT scan showed severe acute colitis and ileitis with edema, wall  thickening, and wall enhancement.  Patent SMV and SMA.  No obstruction, free  air, or pneumatosis noted.   ASSESSMENT:  A 45 year old white female with past medical history as stated  above, presents with acute onset of abdominal pain and diarrhea concerning  for ischemia from her vasculitis.  Based on abdominal CT scan she has acute  colitis and ileitis with patent large vessels  noted.  I agree with high dose  IV steroids.  I doubt that mesalamine would help in this situation in the  acute setting and since ischemia with inflammation has been more prevalent  previously would continue the high dose of IV steroids.  Budesonide may be  an option, when the patient is transitioned off of the IV steroids, but  would be premature to start budesonide now.  Agree with the desire by Dr.  Lenord Fellers to transfer the patient to [facility] for more specialized care.  The  patient currently appears stable for transfer.  If her condition worsens  then would consider getting an MRA, otherwise we will continue to follow on  high dose IV steroids.  Will follow in consultation with Dr. Lenord Fellers.  If the  patient is admitted to [facility] instead of transfer to [facility] then  Dr. Evette Cristal will continue to see the patient in consultation this week.  No  indication for repeat colonoscopy at this time.  Would continue current  management as stated above.      Shirley Friar, MD  Electronically Signed     VCS/MEDQ  D:  10/03/2005  T:  10/03/2005  Job:  [id number]   cc:   [name], M.D.  Fax: [phone]

## 2011-04-23 NOTE — Consult Note (Signed)
NAME:  [name], [name]     ACCOUNT NO.:  [id number]   MEDICAL RECORD NO.:  [id number]          PATIENT TYPE:  INP   LOCATION:  2004                         FACILITY:  [facility]   PHYSICIAN:  Mindi Slicker. Lowell Guitar, M.D.  DATE OF BIRTH:  December 07, 1965   DATE OF CONSULTATION:  10/04/2005  DATE OF DISCHARGE:                                   CONSULTATION   I was asked by Dr. 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 She underwent skin biopsy in October  which demonstrated evidence of leukocytoblastic vasculitis. In addition, the  patient had the onset of abdominal pain in October and had rectal bleeding  as well with radiographic evidence of terminal ileitis by CT scan and biopsy  of the terminal ileum demonstrated features consistent with ischemic  necrosis. The patient is also complaining of some swelling on top of her  head, foamy urine, and the onset yesterday of tea-colored urine. She has  been on corticosteroids for several weeks. Urinalysis demonstrates too  numerous to count red blood cells and therefore nephrology consultation has  been requested. The patient recently took Vivactin and this was felt  possibly to be implicated in precipitating a hand rash which occurred also  in early September.   CURRENT MEDICATIONS:  1.  Solu-Medrol 80 mg IV q.6h.  2.  IV Protonix 40 mg q.12h.  3.  Dilaudid PCA for abdominal pain.  4.  Tylenol.  5.  Magic Mouthwash.   PAST MEDICAL HISTORY:  1.  Status post Cesarean section times three.  2.  Status post appendectomy.  3.  History of sleep apnea.  4.  Obesity.  5.  Glucose intolerance exacerbated by steroids.   PHYSICAL EXAMINATION:  VITAL SIGNS: Blood pressure 130/83, O2 saturation  96%, heart rate 89.  GENERAL: This is a very pleasant,  mildly obese female.  SKIN: There is hyperpigmented purpuric lesions of a circular nature in her  lower extremities. Each one of these lesions varies in size, approximately 1  cm or less. The lesions are in the dependent distribution, worse in the  distal lower extremities extending up to the lower waist area. There are  also a few ecchymosis in her upper extremities, but these appear to be  mainly traumatic although there are some small areas that appear purpuric.  HEENT: Normocephalic and atraumatic. Extraocular movements are intact.  LUNGS: Clear.  HEART: Regular rate and rhythm. No pericardial friction rub.  ABDOMEN: Nontender. There is no significant suprapubic tenderness or CVA  tenderness.  EXTREMITIES: There is 1+ edema in the pedal area.  NEUROLOGIC: She is oriented, pleasant, and there is no gross focality.   LABORATORY STUDIES:  Sed rate 25, sodium 134, potassium 4.1, chloride 106,  CO2 25, BUN 19, creatinine 0.7, albumin 1.8. Hemoglobin 10.3, hematocrit 30,  platelet count 228,000. Urinalysis reveals 7-10 white blood cells, too  numerous to count red blood cells, greater than 300 mg of protein, urine  sodium 38. Cryoglobulin negative.  ANCA negative. ANA negative.  Kidneys by  CT scan done on October 03, 2005, with contrast did not show any  abnormalities.   ASSESSMENT:  1.  Small vessel vasculitis, rule out Henoch Schlein purpura.   PLAN:  1.  I have asked dermatology to consider re-biopsy of the skin to check for      IgA in the tissues.  2.  May need renal biopsy for diagnosis given steroid treatment has already      been started and if skin biopsy is negative.  3.  24-hour urine for protein and creatinine.  4.  I agree with intravenous steroids.  5.  Check serum IgA level.   Thanks for letting us see this patient.      Mindi Slicker. Lowell Guitar, M.D.  Electronically Signed     ACP/MEDQ  D:  10/04/2005  T:  10/04/2005  Job:  [id number]

## 2011-04-23 NOTE — Discharge Summary (Signed)
NAME:  [name], [name]     ACCOUNT NO.:  [id number]   MEDICAL RECORD NO.:  [id number]          PATIENT TYPE:  INP   LOCATION:  2032                         FACILITY:  [facility]   PHYSICIAN:  [name], M.D.   DATE OF BIRTH:  09-Jan-1966   DATE OF ADMISSION:  09/15/2005  DATE OF DISCHARGE:  09/27/2005                                 DISCHARGE SUMMARY   FINAL DIAGNOSES:  1.  Leukocytoclastic vasculitis.  2.  Gastrointestinal bleed secondary to #1.  3.  Hypertension.  4.  Gastroesophageal reflux.  5.  Possible terminal ileitis.  6.  History of asthma.  7.  History of mild sleep apnea.   CONSULTANTS:  1.  Dr. Leary Roca, gastroenterology  2.  Dr. Lebron Conners, general surgery  3.  Dr. Synthia Innocent, critical care  4.  Dr. Estill Bakes, rheumatology  5.  Dr. 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  She had been up all night with severe pain.  She  initially presented August 18, 2005 to the office with desquamation of  her hands and complaining of itching.  She subsequently complained of  arthralgias in her joints, especially her hands.  She had a positive Tinel's  sign and was referred to a hand surgeon who diagnosed her with carpal tunnel  syndrome who placed her on a short course of steroids, i.e., a Medrol dose  pack on August 25, 2005.  On September 07, 2005 she was seen in my office  with purpuric lesions on her lower extremities.  It was felt she had  vasculitis and multiple laboratory tests were done.  White blood cell count  was 11,700.  Liver functions were okay.  ANA was negative.  Rheumatoid  factor was negative.  Sedimentation rate was 7.  [facility] and [facility]  Spotted Fever titers were negative.  Sedimentation rate on admission September 15, 2005 was 0.  CMV and Malachi Carl titers were negative for acute  illness, but reflected previous exposure.  Parvo virus B-19 also suggested  remote exposure.  P-ANCA was negative.  ANA was repeated here in the  hospital and showed a low titer of 1:40.  Patient was started initially on  Solu-Medrol IV for the vasculitis.  Was given IV Protonix.  GI consultation  was obtained.  She underwent endoscopy by Dr. Ewing Schlein on September 16, 2005 and  he indicated patient had, at most, moderate duodenitis.  He did note a small  hiatal hernia.  He did review her CT and was concerned about ileitis.  She  was hospitalized on the nontelemetry floor and eventually pain became  controlled with IV Demerol and nausea was controlled with IV Phenergan.  However, in the early morning hours of September 17, 2005 she developed severe  abdominal pain which lasted several hours and was unresponsive to pain  medication.  Around 4  a.m. GI consultant was contacted.  Dr. Ewing Schlein later  came in to see the patient and was concerned about possible ischemic bowel.  An MRA was ordered of her celiac access and this proved to be negative for  clot in the mesenteric artery, etc.  Dr. Ewing Schlein had also performed a  colonoscopy with biopsy on September 16, 2005.  He proceeded to review the  pathology of his biopsy of the colon with pathologist and they felt that the  biopsies revealed more ischemia than inflammation.  Because of the patient's  uncontrollable pain and ischemic bowel symptoms she was transferred to the  intensive care unit.  Hematology was consulted regarding the vasculitis and  she was placed on IVIG for 48 hours.  Steroids were increased to Solu-Medrol  80 mg IV t.i.d.  She was seen in consult by critical care specialists.  She  had considerable amount of maroon colored stool passed, but did not require  transfusion.  Hemoglobin  dropped to 9.4 g.  IV fluids were changed to normal  saline at 125 mL/hour.  At no point did she become hypotensive.  In fact,  she remained at times hypertensive and was eventually started on Toprol XL  12.5 mg daily increasing to 25 mg daily.  Blood pressures have ranged from  130-190 systolically during this hospitalization, but now are under good  control with Toprol at approximately 130 systolically.  Patient has been  anxious during this hospitalization and concerned about her condition as one  would expect.  She also developed thrush after having been started on Cipro  and Flagyl by Dr. Ewing Schlein on September 17, 2005 in addition to steroids and has  been treated with Diflucan 150 mg daily for three days, Mycelex Troches, and  Magic mouthwash.  Patient ran low-grade fever at times.  Blood cultures were  obtained on September 24, 2005, but proved to be negative.  Her urine at times  showed hematuria but she was on her menstrual cycle so it was difficult to  know whether this was vasculitis or just menstrual bleeding.  Urine culture  was negative.  Chest x-ray was negative.  She had an ultrasound of her  thyroid gland because of possible mass noted on CT and that also proved to  be negative.  Repeat CT scan was ordered September 24, 2005 of her abdomen and  it showed no change from previous CT scan.  We noted her right calf was  swollen with a possible cord in the medial calf on September 24, 2005 and CT  of the chest was ordered showing no pulmonary emboli.  Subsequently,  Dopplers of the lower extremity were done September 25, 2005 and proved to be  negative but she did receive one dose of Lovenox on the evening of September 24, 2005 when this concern was noted.  Pulse oximetries remained normal  throughout the hospitalization.  We had to watch Accu-Cheks carefully and  she received Lantus 5 units subcutaneously q.12h.  We did not try to strictly control her serum glucose, but tried to keep it in  line certainly  under 200.  Subsequently, the patient began to improve and began to have  normal bowel movements and her bleeding discontinued.  However, she still  had guaiac-positive stool mid week September 22, 2005.  Dr. Ewing Schlein felt she  might have guaiac-positive stool on occult blood testing for a couple of  weeks or so.  She was tolerating a soft diet at the time  of discharge having  had her diet advanced slowly, initially being made n.p.o.  She had a lot of  laboratory work done including SPEP which showed a nonspecific pattern.  Immunofixation was negative.  Parvo B-19 showed remote infection.  The pain  in her abdomen was so severe she had to be placed on a Dilaudid pump and  that was discontinued on September 25, 2005.  She had no further pain.  She  had a large bowel movement on September 25, 2005 and two bowel movements on  September 26, 2005 and by September 27, 2005 was feeling much better and was  ready to go home.  I spoke with rheumatologist, Dr. Phylliss Bob who advised  continuing her on prednisone 60 mg daily.  By this time Solu-Medrol had been  tapered down to once daily.  We started her on oral prednisone 60 mg daily.  He advised keeping her on this dose for two weeks and he would be seeing her  in follow-up October 06, 2005.  White blood cell count at the time of  discharge was 17,900 with a left shift and toxic granulation and for that  reason she was covered with Cipro 500 mg p.o. b.i.d. orally and Flagyl 500  mg p.o. b.i.d. orally for a period of two weeks.  Antibiotics had been  restarted on September 24, 2005 because of an increase in white count that  topped out at 24,000.  Previously her white blood cell count had decreased  after IVIG, IV steroids, and IV antibiotics but the IV antibiotics were  stopped on September 22, 2005 and white count began to rise again.  However,  we found no source or abscess or any reason for the increased white count  other than steroids but I was still  concerned about the toxic granulation.  She was tried on Asacol for a while but that was subsequently discontinued  since the biopsy showed more ischemia than inflammation.  She is being  discharged home in improved and stable condition.   DISCHARGE MEDICATIONS:  1.  Cipro 500 mg p.o. b.i.d. for 14 days.  2.  Flagyl 500 mg p.o. b.i.d. for 14 days.  3.  Protonix 40 mg twice a day or may substitute Prilosec 20 mg twice a day.  4.  Vicodin 5/500 one p.o. q.4h. as needed for joint      pain/arthralgias/myalgias.  5.  Magic mouthwash two teaspoons p.o. swish, do not swallow four times a      day.  6.  Glipizide (Glucotrol) 5-10 mg before breakfast and supper.  She is to      keep check of her Accu-Cheks at least t.i.d.  7.  Prednisone 60 mg daily until rheumatologist sees her on November 1.  8.  Phenergan 25 mg every four hours as needed for nausea.  9.  Toprol XL 25 mg daily for hypertension. 10. Terazol 7 vaginal cream q.h.s. for seven days as needed for candida      vaginitis.   I will see her again in one to two weeks.  She will call if symptoms recur,  i.e., if severe abdominal pain or if lesions on her lower extremities  worsen.           ______________________________  [name], M.D.     MJB/MEDQ  D:  09/27/2005  T:  09/27/2005  Job:  [id number]   cc:   Petra Kuba, M.D.  Fax: [phone]   Lebron Conners, M.D.  [phone]  [address]  [place]  [place] [address]   Oley Balm. Sung Amabile, M.D. [facility]  [address]  [place]  [place] [address]   Areatha Keas, M.D.  Fax: [phone]   Rockey Situ. Flavia Shipper., M.D.  Fax: [phone]

## 2011-04-23 NOTE — H&P (Signed)
NAME:  [name], [name]               ACCOUNT NO.:  [id number]   MEDICAL RECORD NO.:  [id number]                   PATIENT TYPE:  AMB   LOCATION:  SDC                                  FACILITY:  [facility]   PHYSICIAN:  Artist Pais, M.D.                 DATE OF BIRTH:  1966/09/28   DATE OF ADMISSION:  10/16/2002  DATE OF DISCHARGE:                                HISTORY & PHYSICAL   HISTORY OF PRESENT ILLNESS:  The patient is a 45 year old gravida 3, para 3,  Caucasian female who is admitted for [facility] hysteroscopy and polypectomy.  The  patient initially presented complaining of irregular bleeding and she was  found to have anemia with a hemoglobin of 10.7.  She had been found to be  bleeding irregularly on her Micronor 2 weeks each month.  This has been of a  duration of about the last 8 months.  The patient subsequently underwent an  extensive workup.  The endometrium was found to be slightly thickened with  an area of focal thickening in the body region that could be an endometrial  biopsy.  A sonohistogram was subsequently performed and the patient was  found to have a thickened posterior endometrial wall versus polyp.  She was  advised to undergo a D&C hysteroscopy and polypectomy.  Risks of surgery  including anesthetic complication, hemorrhage, infection, damage to adjacent  structures including bladder, bowel, blood vessels or ureter were discussed  with the patient.  She was made aware of the risk of uterine perforation  which could result in overwhelming life-threatening hemorrhage requiring  emergent hysterectomy and uterine perforation which could result in bowel  damage requiring emergent colostomy or which could result in overwhelming  life-threatening peritonitis.  She expresses understanding of and acceptance  of these risks and desires to proceed.  I do feel it is prudent to ascertain  her endometrial lining due to her body habitus and her menorrhagia.   OBSTETRICAL/GYNECOLOGICAL HISTORY:  1. Menarche - age 70.  2. Contraception - Micronor.  3. Cycle interval - generally every 28 days with 5-day duration flow but     most recently the patient has been having a period every 2 weeks.   PAST MEDICAL HISTORY:  1. History of anxiety and depression.  2. Gastroesophageal reflux.  3. Menometrorrhagia.  4. Obesity.  5. Anemia.   ALLERGIES:  No known drug allergies.   CURRENT MEDICATIONS:  1. Prilosec.  2. Wellbutrin.  3. Lexapro.  4. Micronor.  5. Iron supplementation.   PAST SURGICAL HISTORY:  1. Cesarean section x3.  2. Appendectomy in 1970.   FAMILY HISTORY:  There is no family history of colon, breast, or prostate  cancer.  The patient's mother is 50 with hypertension.  She also had a  hysterectomy for endometriosis.  The patient does have a paternal cousin who  developed ovarian cancer.  Her father is 61 with skin cancer, CVA, and  hypertension.  One  brother is 69 with hypertension.  Another brother age 54  with hypertension.  Three children ages 5, 61, and 8 all alive and well.   SOCIAL HISTORY:  The patient is an Charity fundraiser at [employer]; tobacco one  half pack per day; alcohol none.   REVIEW OF SYSTEMS:  Noncontributory except as noted above.  Denies headache,  visual changes, chest pain, shortness of breath, abdominal pain, change in  bowel habits, unintentional weight loss, dysuria, urgency, frequency,  vaginal pruritus or discharge, pain or bleeding with intercourse.   PHYSICAL EXAMINATION:  GENERAL APPEARANCE: Well-developed Caucasian female.  VITAL SIGNS: Blood pressure 134/80, height 5 feet 4-1/2 inches, weight 222.  HEENT: Normal.  NECK: Supple, without thyromegaly, adenopathy, or nodules.  CHEST: Clear to auscultation.  BREASTS: Symmetrical without masses, nodes, nipple retraction, or nipple  discharge.  CARDIAC: Regular rate and rhythm without extra sounds or murmurs.  ABDOMEN: Soft, obese, nontender; no  hepatosplenomegaly or masses.  EXTREMITIES: No cyanosis, clubbing, or edema.  NEUROLOGICAL: Oriented x3; grossly normal.  PELVIC: Normal external female genitalia; no vulva, vaginal, or cervical  lesions; Pap smear performed September 06, 2002 was within normal limits.  Bimanual examination reveals the uterus to be nonmobile, nontender, without  any adnexal mass palpated.  RECTAL: Excellent sphincter tone confirms pelvic exam; no masses palpated.   IMPRESSION AND PLAN:  The patient is a 45 year old gravida 3, para 23,  Caucasian female who bleeds 2 out of every 4 weeks with a body habitus of  222 pounds at 5 feet 4-1/2 inches at risk for endometrial hyperplasia or  cancer.  The patient is admitted for dilation and curettage hysteroscopy and  polypectomy after sonohistogram does show a possible endometrial polyp.  She  does know she is at increased risk to develop endometrial hyperplasia.  Risks have been discussed with the patient.  She expresses understanding of  and acceptance of these risks.  She does desire to proceed with surgery.                                               Artist Pais, M.D.    DC/MEDQ  D:  10/15/2002  T:  10/16/2002  Job:  [id number]

## 2011-04-23 NOTE — Procedures (Signed)
NAME:  [name], [name]     ACCOUNT NO.:  [id number]   MEDICAL RECORD NO.:  [id number]          PATIENT TYPE:  OUT   LOCATION:  SLEEP CENTER                 FACILITY:  [facility]   PHYSICIAN:  Clinton D. Maple Hudson, M.D. DATE OF BIRTH:  06-Feb-1966   DATE OF STUDY:  04/21/2005                              NOCTURNAL POLYSOMNOGRAM   REFERRING PHYSICIAN:  Dr. Jetty Duhamel   DATE OF STUDY:  Apr 21, 2005   INDICATION FOR STUDY:  Hypersomnia with sleep apnea.  Epworth Sleepiness  Score 14/24, BMI 36, weight 217 pounds.   SLEEP ARCHITECTURE:  Total sleep time 377 minutes with sleep efficiency 84%.  Stage I was 13%, stage II 73%, stages III and IV 15% and REM was absent.  Sleep latency 28 minutes, awake after sleep onset 43 minutes, arousal index  21.  No sleep medication was reported.  The patient chose to have television  on through the night.   RESPIRATORY DATA:  Respiratory disturbance index (RDI, AHI) 10.5 obstructive  events per hour indicating mild obstructive sleep apnea/hypopnea syndrome.  There were 8 obstructive apneas and 58 hypopneas.  Events were not  positional.  REM RDI was N/A.  She did not qualify for split protocol  titration.   OXYGEN DATA:  Variable snoring, sometimes loud, with oxygen desaturation to  a nadir of 87%.  Mean oxygen saturation through the study was 94% on room  air.   CARDIAC DATA:  Normal sinus rhythm.   MOVEMENT/PARASOMNIA:  A total of 149 limb jerks were recorded of which 16  were associated with arousal or awakening for a periodic limb movement with  arousal index of 2.5 per hour which is increased.   IMPRESSION/RECOMMENDATION:  1.  Mild obstructive sleep apnea/hypopnea syndrome, respiratory disturbance      index 10.5 per hour with variable snoring and oxygen desaturation to      87%.  2.  She might qualify for continuous positive airway pressure if symptoms      are documented.  3.  Note some question of sleep hygiene with television left on  through the      night.  4.  Periodic limb movement with arousal, 2.5 per hour.      CDY/MEDQ  D:  04/25/2005 12:14:51  T:  04/25/2005 18:56:29  Job:  [id number]

## 2011-04-23 NOTE — H&P (Signed)
NAME:  [name], [name]     ACCOUNT NO.:  [id number]   MEDICAL RECORD NO.:  [id number]          PATIENT TYPE:  INP   LOCATION:  2032                         FACILITY:  [facility]   PHYSICIAN:  [name], M.D.   DATE OF BIRTH:  05/09/1966   DATE OF ADMISSION:  09/15/2005  DATE OF DISCHARGE:                                HISTORY & PHYSICAL   CHIEF COMPLAINT:  Severe epigastric pain, nausea, and diarrhea.   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  She had a positive Tinel's sign.  I referred her to a hand surgeon, Dr.  Merlyn Lot and she was placed on a short course of steroids (Medrol dose pack) on  August 25, 2005.  Later on September 07, 2005 she was seen in the office  with purpuric lesions on her lower extremities.  Multiple laboratory tests  were done.  Her white blood cell count was 11,700.  Her liver functions were  normal.  ANA was negative.  Rheumatoid factor was negative.  [facility]  Spotted Fever titers were also normal.  She was seen by Dr. 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  Other studies were  performed looking for possible malignancy.  Sedimentation rate initially was  7 and on the day of admission it is 0.  CMB and Malachi Carl titers were  drawn September 14, 2005.  Complement studies are pending.  She has been on  oral prednisone since October 5 starting initially 60 mg daily for five days  and is now down to 50 mg daily.  She had a CT of her abdomen and pelvis  September 14, 2005 raising the question of possible inflammatory bowel disease  and a possible mass in the thyroid.  She has had five or six episodes of  diarrhea since 1 a.m. today and severe epigastric pain without rebound  tenderness.  She now has mild elevation of her liver function tests and her  white blood cell count is 18,000 ?related to steroid therapy.  She was seen  in the emergency department and was treated with IV Zofran and Dilaudid.  Amylase and lipase are within normal limits.  Her potassium is low at 2.5  but she denies diuretic treatment or fluid retention.  She has a history of  GE reflux, has been taking Prilosec 20 mg b.i.d., history of asthma, history  of migraine headaches.  She is overweight.  She has smoked for some 15  years, but smokes no more than a half pack a day.  no significant alcohol  intake.  Patient is married with three children.  Father with history of  CVA.  There is no family history of gallbladder disease.  Both parents have  hypertension.  Patient had an ultrasound of her gallbladder in 2001 which  was negative.  Her GYN physician is Dr. Artist Pais and she is on Micronor  oral contraceptives.  In June she was started on Wellbutrin for fatigue and  possible depression, but has not had any of this in three weeks.  She was  also started in June on Vivactyl per Dr. Fannie Knee, pulmonologist for  sleep apnea, daytime somnolence, and snoring.  She has had none of that in  three or four weeks.  I recently started her on Adderall for attention  deficit problems.  She uses her albuterol inhaler sparingly.   PHYSICAL EXAMINATION:  VITAL SIGNS:  Please see ER sheet.  SKIN:  Warm and dry.  NODES:  None.  HEENT:  Head is normocephalic, atraumatic.  Sclerae and conjunctivae are  clear.  TMs and pharynx are clear.  NECK:  Supple.  No JVD, thyromegaly, or carotid bruits.  CHEST:  Clear to auscultation.  ABDOMEN:  After Dilaudid given slightly tender.  No rebound tenderness.  No  organomegaly appreciated.  EXTREMITIES:  Trace lower extremity edema.  Multiple purpuric lesions noted  on lower extremities.   IMPRESSION:  1.  Acute abdominal pain associated with purpura and  leukocytoclastic      vasculitis.  2.  History of gastroesophageal reflux.  3.  History of asthma.  4.  Obesity.   PLAN:  Patient will be given IV fluids and IV pain medication.  GI  consultation and rheumatology consultations will be done.  Need to rule out  inflammatory bowel disease and collagen vascular disease.  Will be given  anti-nausea medications.           ______________________________  [name], M.D.     MJB/MEDQ  D:  09/27/2005  T:  09/27/2005  Job:  [id number]   cc:   Areatha Keas, M.D.  Fax: [phone]   Petra Kuba, M.D.  Fax: [phone]   Lebron Conners, M.D.  [address]  [place]  [place] [address]   Oley Balm. Sung Amabile, M.D. [facility]  [address]  [place]  [place] [address]   Rockey Situ. Flavia Shipper., M.D.  Fax: [phone]

## 2011-04-23 NOTE — Consult Note (Signed)
NAME:  [name], [name]     ACCOUNT NO.:  [id number]   MEDICAL RECORD NO.:  [id number]          PATIENT TYPE:  INP   LOCATION:  6741                         FACILITY:  [facility]   PHYSICIAN:  Petra Kuba, M.D.    DATE OF BIRTH:  Aug 15, 1966   DATE OF CONSULTATION:  09/15/2005  DATE OF DISCHARGE:                                   CONSULTATION   HISTORY:  The patient is seen at the request of Dr. Lenord Fellers for abdominal  pain.  This is an acute problem.  Woke her up from her sleep.  Did not have  any nausea or vomiting or fever.  Did move her bowels a few times.  Pain  seems to come and go in waves.  She was evaluated at the emergency room and  was admitted for further work-up and plans.  Her white count was elevated  but she is on prednisone.  Her exam was actually not too impressive without  any guarding or rebound.  Yesterday she had a CT scan because of her  leukoclastic vasculitis looking for malignancy.  There might be very minimal  GI abnormality versus underdistention, but no other obvious abnormalities  and ultrasound done today showed a little bit of new fluid between her liver  and her kidney which was minimal but no other obvious abnormalities.  She  had been on pump inhibitors all along for reflux.  She has not taken any  aspirin or nonsteroidals since she was started on prednisone a few weeks  ago.   PAST MEDICAL HISTORY:  1.  Pertinent for the leukoclastic vasculitis as above.  2.  She has had GERD.  3.  Asthma.  4.  Migraines.  5.  No other significant medical problems.  6.  She also has sleep apnea.   FAMILY HISTORY:  Negative for any autoimmune problems, gallbladder disease,  Crohn's colitis, etc.   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  Had  used aspirin and nonsteroidals in the past recently.   CURRENT MEDICATIONS:  Birth control pills, Prilosec, Adderall, and vitamins.   ALLERGIES:  None.   REVIEW OF SYSTEMS:  Negative  except as above.   PHYSICAL EXAMINATION:  VITAL SIGNS:  See chart.  GENERAL:  No acute distress, actually sleeping from the pain medicine,  easily aroused.  Afebrile.  ABDOMEN:  Soft, nontender.  Good bowel sounds.  Did not really duplicate the  pain.   LABORATORY DATA:  Pertinent for the white count elevation and some very  minimal transaminase elevations.  Normal BUN and creatinine, hemoglobin and  platelets of which she did not have a left shift.  Sed rate is 0.  Lipase  normal.  Amylase normal.  Ultrasound and CT scan reviewed as above.   ASSESSMENT:  1.  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  2.  Abdominal pain, questionable etiology.  I am not sure about her CT scan      abnormalities.  Certainly I do not think it is playing a role with her      midepigastric pain.  Her ultrasound shows a minimal amount of new fluid  between the liver and the kidney and again not sure if it is      significant.  Do not think she perforated an ulcer.  3.  Minimal elevated liver tests.  4.  Minimal elevated white count.   PLAN:  Will check her out clinically in the morning.  Consider an endoscopy  first, then upper GI with small bowel follow-through.  Will follow labs in  the meantime.  Agree with NPO and IV Protonix for now.           ______________________________  Petra Kuba, M.D.     MEM/MEDQ  D:  09/15/2005  T:  09/15/2005  Job:  [id number]   cc:   [name], M.D.  Fax: [phone]

## 2011-04-23 NOTE — Discharge Summary (Signed)
NAME:  [name], [name]     ACCOUNT NO.:  [id number]   MEDICAL RECORD NO.:  [id number]          PATIENT TYPE:  INP   LOCATION:  2004                         FACILITY:  [facility]   PHYSICIAN:  [name], M.D.   DATE OF BIRTH:  [birth year]   DATE OF ADMISSION:  10/03/2005  DATE OF DISCHARGE:  10/04/2005                                 DISCHARGE SUMMARY   FINAL DIAGNOSES:  1.  Leukocytoblastic vasculitis.  2.  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  3.  Gastroesophageal reflux.  4.  Mild hypertension.  5.  Hypokalemia secondary to number one.  6.  Mild glucose intolerance secondary to steroid therapy for number one.   CONDITION:  Stable.   BRIEF HISTORY:  This 44 year old white female who has been diagnosed with  leukocytoclastic vasculitis, history of GI bleed on previous admission  September 17, 2005 with persistent arthralgias, fatigue, malaise and purpuric  rash presented to the emergency department on October 03, 2005 in the early  morning complaining of epigastric pain.  The pain was epigastric in origin  and to the right of the midline.  Rebound tenderness was present.  She was  seen initially by emergency department physician who started IV fluids  consisting of normal saline.  He spoke with radiologist as to whether or not  the patient needed CT with contrast versus MRA and radiologist advised doing  another CT with contrast.  Radiologist called the afternoon of October 03, 2005 and indicated the patient had a significant terminal ileitis,  significant ascites, and a significant active colitis.  He felt that the  vessels were well-visualized on the CT study that he did and that an MRA was  not indicated.   We placed her back on high-dose steroids consisting of Solu-Medrol 80 mg IV  q.8h.  we had GI consultant from [facility], Dr. Bosie Clos, see her.  He did  not recommend restarting Asacol.  We held her antibiotics.  She had been on  Cipro and  Flagyl as an outpatient.  Please see previous discharge summary.  She had been on prednisone 60 mg daily since discharge on September 27, 2005  and was to have seen local rheumatologist, Dr. Estill Bakes on October 06, 2005.  Last week, she had called the office a couple of times complaining of  various arthralgias including her elbow, her knee and one day called  complaining of a swelling on the top of her head.  However, she had no  active bleeding and no abdominal pain last week whatsoever.  She advanced  her diet slowly but still was on some soft foods.  She commented that her  urine output was normal but on October 03, 2005, she noted that her urine  had become tea-colored and there were too numerous to count red blood cells  on high power field.  Urine culture has been ordered.  However, it would  appear that she has kidney involvement with her vasculitis at this point in  time.  Her BUN and creatinine are normal.  Sedimentation rate is 25.  Urine  output was noted to be a bit  decreased yesterday so we increased her IV  fluids overnight to 125 mL per hour.  This morning, she continues to have  decreased urine output and we are increasing her fluids to 200 mL per hour  for three hours to see if urine output will pick up.  Specific gravity of  her urine at the time of admission was 1.035 and potassium was 3.2.  She had  considerable problems with hypokalemia not related to diuretics because she  was not given any diuretics in the hospital at last admission but I assume  it was from some malabsorption going on with her GI tract.  She had to have  multiple runs of IV potassium to keep her potassium stable.  Yesterday, we  gave her two runs of 10 mEq KCl IV and her potassium this morning is 4.1.   We have done an ultrasound of her kidneys and at this point in time, at 11  a.m., the result is pending.  Chest x-ray has been ordered and shows no  effusions.   Rash looks to be a little more  diffuse to me on her lower extremities.  Some  of the lesions are light red to light brown in color and seem to be fading,  others are more dark red.   We feel that this patient needs care at a tertiary medical center with  multiple specialists involved.  She has agreed to transfer.  A number  consultants are in agreement with transfer as well.           ______________________________  [name], M.D.     MJB/MEDQ  D:  10/04/2005  T:  10/04/2005  Job:  [id number]

## 2011-04-23 NOTE — Op Note (Signed)
NAME:  [name], [name]     ACCOUNT NO.:  [id number]   MEDICAL RECORD NO.:  [id number]          PATIENT TYPE:  INP   LOCATION:  6741                         FACILITY:  [facility]   PHYSICIAN:  Petra Kuba, M.D.    DATE OF BIRTH:  1966-02-25   DATE OF PROCEDURE:  09/16/2005  DATE OF DISCHARGE:                                 OPERATIVE REPORT   PROCEDURE:  Colonoscopy and biopsy.   ENDOSCOPIST:  Petra Kuba, M.D.   INDICATIONS:  Lower GI bleeding, abdominal pain, abnormal CAT scan.  Consent  was signed prior to any premeds given after risks, benefits, methods and  options were thoroughly discussed.   ADDITIONAL MEDICINES FOR THIS PROCEDURE:  Demerol 10 mg, Versed 1 mg.   PROCEDURE:  Rectal inspection was pertinent for small external hemorrhoids.  Digital exam was negative.  Video pediatric adjustable colonoscope was  inserted and fairly easily advanced around the colon to the cecum.  The prep  was only fair.  To advance to the cecum did not require any abdominal  pressure or an position changes.  There was a little bit of old blood in the  distal colon and some dark stool throughout, but no signs of active  bleeding.  The cecum was identified by the appendiceal orifice and the  ileocecal valve.  To advance into the terminal ileum required rolling her on  her back and some abdominal pressure.  The TI was markedly inflamed with  ulcerations and erythema; it  looked more ischemic than like Crohn's  disease.  A few careful scattered biopsies were obtained and put in the  first container, and scope was slowly withdrawn.  The rest of colon was  evaluated fairly well, although with a fair prep, lesions could have been  missed.  Some stool was washed and suctioned.  On slow withdrawal back  through the rectum, no abnormalities were seen, except for a few tiny-to-  small rectal polyps, probably hyperplastic, which were cold-biopsied and put  in a separate container.  Anorectal  pull-through in retroflexion confirmed  some small hemorrhoids.  The rest of the colon was normal.  The scope was  straightened, air was suctioned, and scope removed.  The patient tolerated  the colonoscopy very well.  There was no obvious immediate complication.   ENDOSCOPIC DIAGNOSES:  1.  Internal/external small hemorrhoids.  2.  Tiny-to-small rectal polyps, cold-biopsied.  3.  Terminal ileum significantly inflamed with ulceration and inflammation,      questionable Crohn's versus ischemic versus infection, status post      biopsy.  4.  Otherwise remainder of the colon normal, but with fair preparations,      small lesions could have been missed.   PLAN:  Await pathology; will send ASAP.  I have discussed the case with both  Dr. Lenord Fellers and Dr. Phylliss Bob.  Consider repeat CT if needed, maybe even an  angiogram.  We will begin Pentasa in the meantime in case this is Crohn's.  Continue clear liquids for now, at least until she is definitely better and  wait on the biopsies to decide further workup and plans.  ______________________________  Petra Kuba, M.D.     MEM/MEDQ  D:  09/16/2005  T:  09/17/2005  Job:  [id number]   cc:   [name], M.D.  Fax: [phone]   Areatha Keas, M.D.  Fax: [phone]

## 2011-04-23 NOTE — Consult Note (Signed)
NAME:  [name], INTRIERI NO.:  [id number]   MEDICAL RECORD NO.:  [id number]          PATIENT TYPE:  INP   LOCATION:  2108                         FACILITY:  [facility]   PHYSICIAN:  Lebron Conners, M.D.   DATE OF BIRTH:  1966/06/14   DATE OF CONSULTATION:  09/17/2005  DATE OF DISCHARGE:                                   CONSULTATION   GENERAL SURGICAL CONSULTATION   REASON FOR CONSULTATION:  Ms. Raider is a 45 year old female who was  admitted two days ago with acute diffuse lower abdominal pain.  She has not  had any abdominal pain prior to this episode.  Incidentally, she did have an  abdominal CT scan on the day prior to admission as part of a work up for  recently diagnosed leukocytoclastic vasculitis, in order to rule out  malignancy.  The CT scan showed some incomplete filling of the terminal  ileum but was otherwise nonspecific.   On admission she had an abdominal ultrasound that showed a 5.7 mm common  bile duct dilatation but no stones were seen. Her gallbladder was normal.  There was a small amount of fluid in the right hepatorenal space.  Due to  her recent vasculitis she has been on some prolonged prednisone treatments  and on admission her white count was 18,500.  She did have some mild liver  function tests elevated which have now normalized.  As part of her work up  Dr. Vida Rigger was consulted and ultimately she underwent  esophagogastroduodenoscopy that showed moderate bulbitis.  She underwent a  colonoscopy that showed ulceration/inflammation of the terminal ileum and a  biopsy was taken.  At this point there is a concern for Crohn's versus  infectious process versus ischemic process.  Overnight she has developed  lower abdominal pain, nausea as well as bloody diarrhea.  Her white count is  up to 40,000.  We are consulted for an acute abdominal process evaluation.  A STAT CT scan of the abdomen and pelvis has been ordered although the  patient  is still drinking her contrast.   ALLERGIES:  No known drug allergies.   CURRENT MEDICATIONS:  Intravenous Cipro, Flagyl, Solu-Medrol, Pentasa, pain  control.   PAST MEDICAL HISTORY:  Mild obstructive sleep apnea.  History of D&C,  history of hysteroscopy and polypectomy November, 2003. History of  appendectomy in 1970.  Cesarean section X3.  She has a history of  gastroesophageal reflux disease, anxiety and depression.   FAMILY HISTORY:  Mother per record had hypertension.  Father had  hypertension, skin cancer.   SOCIAL HISTORY:  She is married and has three children.  She is a Engineer, civil (consulting) at  [employer] and she lives in [place].   PHYSICAL EXAMINATION:  VITAL SIGNS:  Temperature 97.7, pulse 111,  respirations 20, blood pressure 133/88.  HEENT: Grossly normal.  She is groggy at this point.  NEUROLOGICAL:  Neurological examination is not very accurate.  She is  essentially groggy from her medication.  CHEST:  Clear to auscultation bilaterally.  HEART:  Regular rate and rhythm with no murmurs but she is tachycardic.  ABDOMEN:  Soft.  She  complains of diffuse tenderness of the lower abdomen.  She has minimal voluntary guarding.  No rigidity or rebound.  EXTREMITIES:  Lower extremities have skin lesions consistent with her  vasculitis.   ASSESSMENT/PLAN:  Acute abdominal pain in the setting of leukocytosis, (She  is on steroids), above esophagogastroduodenoscopy/colonoscopy findings.  She  will have a STAT CT scan performed.  Since she has only been able to get  about one cup of the contrast down over the past two hours, delaying the CT  and we will go ahead and perform the CT scan because of my concern of  surgical urgency.  I will closely monitor this patient for any change in  condition as well as any CT scan findings and relay these to Dr. Orson Slick.  We  will follow up with her later today.      Guy Franco, P.A.      ______________________________   Lebron Conners, M.D.    LB/MEDQ  D:  09/17/2005  T:  09/17/2005  Job:  [id number]   cc:   [name], M.D.  Fax: [phone]   Petra Kuba, M.D.  Fax: [phone]   Areatha Keas, M.D.  Fax: [phone]

## 2011-04-23 NOTE — Op Note (Signed)
NAME:  [name], [name]               ACCOUNT NO.:  [id number]   MEDICAL RECORD NO.:  [id number]                   PATIENT TYPE:  AMB   LOCATION:  SDC                                  FACILITY:  [facility]   PHYSICIAN:  Artist Pais, M.D.                 DATE OF BIRTH:  1966-05-01   DATE OF PROCEDURE:  10/16/2002  DATE OF DISCHARGE:                                 OPERATIVE REPORT   PREOPERATIVE DIAGNOSES:  1. Menorrhagia.  2. Thickened posterior endometrium.  3. Possible endometrial polyp.   POSTOPERATIVE DIAGNOSES:  1. Menorrhagia.  2. Thickened posterior endometrium.  3. Polyp confirmed on hysteroscopy.   SURGEON:  Artist Pais, M.D.   ESTIMATED BLOOD LOSS:  Minimal.   ANESTHESIA:  Monitored anesthesia care plus 10 cc 1% lidocaine paracervical  block.   COMPLICATIONS:  None.   FINDINGS:  A thickened posterior endometrium with a polyp which was removed  in its entirety.  The remainder of the endometrium appeared thin and normal.   DRAINS:  None.   DESCRIPTION OF OPERATION:  The patient was brought to the operating room,  identified on the operating room table.  After the patient was adequately  sedated using monitored anesthesia care, she was placed in the dorsal  lithotomy position and prepped and draped in the usual sterile fashion.  A  bimanual examination revealed the uterus to be anteverted, 6 weeks, mobile,  nontender without any adnexal mass palpated.  The anterior lip of the cervix  was subsequently infiltrated with 1 cc of 1% lidocaine and grasped with a  single tooth tenaculum.  The remaining 9 cc of lidocaine were infused for a  paracervical block.  The uterus sounded to 8 cm.  The cervix was very  carefully and gently dilated up to a number 25 Pratt dilator.  Subsequently,  the ACMI hysteroscope using sorbitol as a distending medium was placed and a  careful and thorough hysteroscopic examination was performed.  The fundus  was noted to be normal and  with a thin endometrium, however, copious, thick  endometrium along the posterior uterine wall with a polypoid projection  consistent with a polyp.  After careful hysteroscopic examination was  performed the scope was removed and a curettage was performed throughout the  entire endometrium until an excellent cry was heard all around.  The Randall-  Stone forceps were placed and additional tissue was removed.  After a good  cry was heard all around the procedure was then terminated.  The patient  tolerated procedure well without apparent complications and was transferred  to the recovery room in stable condition after all instrument, sponge, and  needle counts were correct.  It should be noted there was noted to be no bleeding from the tenaculum  site.  The patient was given the post [facility] instruction sheet.  Urged to call  if any problems and to return to the office in two to three weeks for a  postoperative evaluation.                                               Artist Pais, M.D.    DC/MEDQ  D:  10/16/2002  T:  10/16/2002  Job:  [id number]

## 2011-05-24 ENCOUNTER — Ambulatory Visit: Payer: 59 | Attending: Neurosurgery | Admitting: Physical Therapy

## 2011-05-24 DIAGNOSIS — IMO0001 Reserved for inherently not codable concepts without codable children: Secondary | ICD-10-CM | POA: Insufficient documentation

## 2011-05-24 DIAGNOSIS — M542 Cervicalgia: Secondary | ICD-10-CM | POA: Insufficient documentation

## 2011-05-24 DIAGNOSIS — R293 Abnormal posture: Secondary | ICD-10-CM | POA: Insufficient documentation

## 2011-05-26 ENCOUNTER — Ambulatory Visit: Payer: 59 | Admitting: Physical Therapy

## 2011-06-07 ENCOUNTER — Ambulatory Visit: Payer: 59 | Attending: Neurosurgery | Admitting: Rehabilitative and Restorative Service Providers"

## 2011-06-07 DIAGNOSIS — R293 Abnormal posture: Secondary | ICD-10-CM | POA: Insufficient documentation

## 2011-06-07 DIAGNOSIS — IMO0001 Reserved for inherently not codable concepts without codable children: Secondary | ICD-10-CM | POA: Insufficient documentation

## 2011-06-07 DIAGNOSIS — M542 Cervicalgia: Secondary | ICD-10-CM | POA: Insufficient documentation

## 2011-06-10 ENCOUNTER — Telehealth: Payer: Self-pay | Admitting: *Deleted

## 2011-06-10 NOTE — Telephone Encounter (Signed)
Pt called requesting Rx for Adderall XR 20mg  filled for tomorrow.  She also would like Pristiq samples if available.

## 2011-06-10 NOTE — Telephone Encounter (Signed)
Written Rx for Adderall XR 20 mg daily #90 and no refill.Also 2 weeks worth of Pristiq samples.

## 2011-06-14 ENCOUNTER — Encounter: Payer: 59 | Admitting: Rehabilitative and Restorative Service Providers"

## 2011-06-16 ENCOUNTER — Ambulatory Visit: Payer: 59 | Admitting: Rehabilitative and Restorative Service Providers"

## 2011-06-23 ENCOUNTER — Ambulatory Visit: Payer: 59 | Admitting: Physical Therapy

## 2011-06-25 ENCOUNTER — Ambulatory Visit: Payer: 59 | Admitting: Physical Therapy

## 2011-06-28 ENCOUNTER — Ambulatory Visit: Payer: 59 | Admitting: Physical Therapy

## 2011-06-30 ENCOUNTER — Ambulatory Visit: Payer: 59 | Admitting: Physical Therapy

## 2011-06-30 ENCOUNTER — Other Ambulatory Visit: Payer: Self-pay | Admitting: Internal Medicine

## 2011-06-30 DIAGNOSIS — Z1231 Encounter for screening mammogram for malignant neoplasm of breast: Secondary | ICD-10-CM

## 2011-07-07 ENCOUNTER — Ambulatory Visit: Payer: 59

## 2011-08-06 ENCOUNTER — Other Ambulatory Visit: Payer: Self-pay | Admitting: Internal Medicine

## 2011-08-06 ENCOUNTER — Telehealth: Payer: Self-pay | Admitting: Internal Medicine

## 2011-08-06 NOTE — Telephone Encounter (Signed)
Please call in # 60 with 3 refills. One tab p.o. q 6 - 8 hours prn pain MJB

## 2011-08-23 DIAGNOSIS — H109 Unspecified conjunctivitis: Secondary | ICD-10-CM

## 2011-09-06 LAB — BASIC METABOLIC PANEL
BUN: 5 — ABNORMAL LOW
BUN: 9
CO2: 23
CO2: 26
Calcium: 7.2 — ABNORMAL LOW
Calcium: 8.1 — ABNORMAL LOW
Chloride: 107
Chloride: 113 — ABNORMAL HIGH
Creatinine, Ser: 0.67
Creatinine, Ser: 0.7
GFR calc Af Amer: >60
GFR calc Af Amer: >60
GFR calc non Af Amer: >60
GFR calc non Af Amer: >60
Glucose, Bld: 88
Glucose, Bld: 92
Potassium: 4
Potassium: 4.1
Sodium: 139
Sodium: 141

## 2011-09-06 LAB — CBC
HCT: 36.7
HCT: 38.7
Hemoglobin: 12.1
Hemoglobin: 12.7
MCHC: 32.8
MCHC: 33
MCV: 87.4
MCV: 87.4
Platelets: 146 — ABNORMAL LOW
Platelets: 162
RBC: 4.2
RBC: 4.43
RDW: 14.5
RDW: 14.6
WBC: 5.6
WBC: 9.7

## 2011-09-06 LAB — MAGNESIUM
Magnesium: 1.8
Magnesium: 2

## 2011-09-06 LAB — DIFFERENTIAL
Basophils Absolute: 0
Basophils Absolute: 0
Basophils Relative: 0
Basophils Relative: 0
Eosinophils Absolute: 0.1
Eosinophils Absolute: 0.3
Eosinophils Relative: 3
Eosinophils Relative: 3
Lymphocytes Relative: 26
Lymphocytes Relative: 36
Lymphs Abs: 2
Lymphs Abs: 2.5
Monocytes Absolute: 0.4
Monocytes Absolute: 0.8
Monocytes Relative: 8
Monocytes Relative: 8
Neutro Abs: 3
Neutro Abs: 6.1
Neutrophils Relative %: 53
Neutrophils Relative %: 62

## 2011-09-06 LAB — HEMOGLOBIN AND HEMATOCRIT, BLOOD
HCT: 38.1
Hemoglobin: 12.4

## 2011-09-08 LAB — CLOSTRIDIUM DIFFICILE EIA: C difficile Toxins A+B, EIA: NEGATIVE

## 2011-09-08 LAB — COMPREHENSIVE METABOLIC PANEL
ALT: 20
AST: 25
Albumin: 3.8
Alkaline Phosphatase: 63
BUN: 21
CO2: 20
Calcium: 8.6
Chloride: 106
Creatinine, Ser: 0.82
GFR calc Af Amer: >60
GFR calc non Af Amer: >60
Glucose, Bld: 142 — ABNORMAL HIGH
Potassium: 3.4 — ABNORMAL LOW
Sodium: 135
Total Bilirubin: 1.1
Total Protein: 6.9

## 2011-09-08 LAB — URINALYSIS, ROUTINE W REFLEX MICROSCOPIC
Glucose, UA: NEGATIVE
Hgb urine dipstick: NEGATIVE
Ketones, ur: 15 — AB
Nitrite: NEGATIVE
Protein, ur: 100 — AB
Specific Gravity, Urine: 1.033 — ABNORMAL HIGH
Urobilinogen, UA: 1
pH: 6

## 2011-09-08 LAB — HEMOGLOBIN AND HEMATOCRIT, BLOOD
HCT: 40.1
HCT: 41.5
HCT: 52.2 — ABNORMAL HIGH
Hemoglobin: 13
Hemoglobin: 13.5
Hemoglobin: 16.9 — ABNORMAL HIGH

## 2011-09-08 LAB — URINE MICROSCOPIC-ADD ON

## 2011-09-08 LAB — DIFFERENTIAL
Basophils Absolute: 0
Basophils Relative: 0
Eosinophils Absolute: 0.1
Eosinophils Relative: 1
Lymphocytes Relative: 8 — ABNORMAL LOW
Lymphs Abs: 2.1
Monocytes Absolute: 0.4
Monocytes Relative: 2 — ABNORMAL LOW
Neutro Abs: 24.6 — ABNORMAL HIGH
Neutrophils Relative %: 90 — ABNORMAL HIGH

## 2011-09-08 LAB — SEDIMENTATION RATE: Sed Rate: 3

## 2011-09-08 LAB — STOOL CULTURE

## 2011-09-08 LAB — CBC
HCT: 51.5 — ABNORMAL HIGH
Hemoglobin: 16.9 — ABNORMAL HIGH
MCHC: 32.9
MCV: 86.8
Platelets: 269
RBC: 5.94 — ABNORMAL HIGH
RDW: 14.3
WBC: 27.3 — ABNORMAL HIGH

## 2011-09-08 LAB — TYPE AND SCREEN
ABO/RH(D): A POS
Antibody Screen: NEGATIVE

## 2011-09-08 LAB — URINE CULTURE
Colony Count: NO GROWTH
Culture: NO GROWTH

## 2011-09-08 LAB — T4, FREE: Free T4: 1.75

## 2011-09-08 LAB — GIARDIA/CRYPTOSPORIDIUM SCREEN(EIA)
Cryptosporidium Screen (EIA): NEGATIVE
Giardia Screen - EIA: NEGATIVE

## 2011-09-08 LAB — OCCULT BLOOD X 1 CARD TO LAB, STOOL: Fecal Occult Bld: POSITIVE

## 2011-09-08 LAB — LIPASE, BLOOD: Lipase: 22

## 2011-09-08 LAB — ABO/RH: ABO/RH(D): A POS

## 2011-09-08 LAB — TSH: TSH: 0.383

## 2011-09-16 ENCOUNTER — Telehealth: Payer: Self-pay | Admitting: Internal Medicine

## 2011-09-16 ENCOUNTER — Other Ambulatory Visit: Payer: Self-pay | Admitting: Internal Medicine

## 2011-09-16 NOTE — Telephone Encounter (Signed)
RX written for Adderall XR 20 mg #90 1 po daily and samples of Pristiq 50 mg # 35 were left in the front office for patient to pick up.  Pt was called and made aware and asked to please have correct names, strengths, and dosages of all medications available the next time she calls in for refills.  [facility] was called and asked to submit a refill request for patient's omeprazole.

## 2011-09-22 ENCOUNTER — Other Ambulatory Visit: Payer: 59 | Admitting: Internal Medicine

## 2011-09-22 DIAGNOSIS — E039 Hypothyroidism, unspecified: Secondary | ICD-10-CM

## 2011-09-22 LAB — TSH: TSH: 0.953 u[IU]/mL (ref 0.350–4.500)

## 2011-09-24 ENCOUNTER — Ambulatory Visit (INDEPENDENT_AMBULATORY_CARE_PROVIDER_SITE_OTHER): Payer: 59 | Admitting: Internal Medicine

## 2011-09-24 ENCOUNTER — Encounter: Payer: Self-pay | Admitting: Internal Medicine

## 2011-09-24 DIAGNOSIS — F329 Major depressive disorder, single episode, unspecified: Secondary | ICD-10-CM

## 2011-09-24 DIAGNOSIS — K219 Gastro-esophageal reflux disease without esophagitis: Secondary | ICD-10-CM

## 2011-09-24 DIAGNOSIS — J45909 Unspecified asthma, uncomplicated: Secondary | ICD-10-CM

## 2011-09-24 DIAGNOSIS — F32A Depression, unspecified: Secondary | ICD-10-CM

## 2011-09-24 DIAGNOSIS — E039 Hypothyroidism, unspecified: Secondary | ICD-10-CM

## 2011-09-24 DIAGNOSIS — I10 Essential (primary) hypertension: Secondary | ICD-10-CM

## 2011-09-24 DIAGNOSIS — F419 Anxiety disorder, unspecified: Secondary | ICD-10-CM

## 2011-09-24 DIAGNOSIS — IMO0001 Reserved for inherently not codable concepts without codable children: Secondary | ICD-10-CM

## 2011-09-24 DIAGNOSIS — Z8669 Personal history of other diseases of the nervous system and sense organs: Secondary | ICD-10-CM

## 2011-09-24 DIAGNOSIS — M7918 Myalgia, other site: Secondary | ICD-10-CM

## 2011-09-24 DIAGNOSIS — D690 Allergic purpura: Secondary | ICD-10-CM

## 2011-09-24 DIAGNOSIS — F341 Dysthymic disorder: Secondary | ICD-10-CM

## 2011-09-24 DIAGNOSIS — F988 Other specified behavioral and emotional disorders with onset usually occurring in childhood and adolescence: Secondary | ICD-10-CM

## 2011-10-10 DIAGNOSIS — E039 Hypothyroidism, unspecified: Secondary | ICD-10-CM | POA: Insufficient documentation

## 2011-10-10 DIAGNOSIS — K219 Gastro-esophageal reflux disease without esophagitis: Secondary | ICD-10-CM | POA: Insufficient documentation

## 2011-10-10 DIAGNOSIS — D690 Allergic purpura: Secondary | ICD-10-CM | POA: Insufficient documentation

## 2011-10-10 DIAGNOSIS — M7918 Myalgia, other site: Secondary | ICD-10-CM | POA: Insufficient documentation

## 2011-10-10 DIAGNOSIS — F32A Depression, unspecified: Secondary | ICD-10-CM | POA: Insufficient documentation

## 2011-10-10 DIAGNOSIS — I10 Essential (primary) hypertension: Secondary | ICD-10-CM | POA: Insufficient documentation

## 2011-10-10 DIAGNOSIS — Z8669 Personal history of other diseases of the nervous system and sense organs: Secondary | ICD-10-CM | POA: Insufficient documentation

## 2011-10-10 DIAGNOSIS — F419 Anxiety disorder, unspecified: Secondary | ICD-10-CM | POA: Insufficient documentation

## 2011-10-10 DIAGNOSIS — F988 Other specified behavioral and emotional disorders with onset usually occurring in childhood and adolescence: Secondary | ICD-10-CM | POA: Insufficient documentation

## 2011-10-10 DIAGNOSIS — J45909 Unspecified asthma, uncomplicated: Secondary | ICD-10-CM | POA: Insufficient documentation

## 2011-10-10 NOTE — Patient Instructions (Signed)
Continue with same medication regimen. Return in 6 months.
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
## 2011-10-22 ENCOUNTER — Other Ambulatory Visit: Payer: Self-pay | Admitting: Internal Medicine

## 2011-10-22 MED ORDER — ENALAPRIL MALEATE 10 MG PO TABS: 10.0000 mg | ORAL_TABLET | Freq: Every day | ORAL | Status: DC

## 2011-11-22 ENCOUNTER — Other Ambulatory Visit: Payer: Self-pay

## 2011-11-22 ENCOUNTER — Telehealth: Payer: Self-pay | Admitting: Internal Medicine

## 2011-11-22 MED ORDER — DESVENLAFAXINE SUCCINATE ER 50 MG PO TB24: 50.0000 mg | ORAL_TABLET | Freq: Every day | ORAL | Status: DC

## 2011-11-22 NOTE — Telephone Encounter (Signed)
Drop off FMLA with note giving reason for it. We are just about out of Pristiq samples. 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 We do have a drug card she can activate.

## 2011-12-03 ENCOUNTER — Other Ambulatory Visit: Payer: Self-pay

## 2011-12-03 MED ORDER — LEVOTHYROXINE SODIUM 75 MCG PO TABS: 75.0000 ug | ORAL_TABLET | Freq: Every day | ORAL | Status: DC

## 2011-12-31 ENCOUNTER — Telehealth: Payer: Self-pay | Admitting: Internal Medicine

## 2011-12-31 MED ORDER — AMPHETAMINE-DEXTROAMPHET ER 20 MG PO CP24: 20.0000 mg | ORAL_CAPSULE | ORAL | Status: DC

## 2011-12-31 NOTE — Telephone Encounter (Signed)
Rx written for #90 with no refill.

## 2012-01-03 ENCOUNTER — Other Ambulatory Visit: Payer: Self-pay

## 2012-01-03 MED ORDER — AMPHETAMINE-DEXTROAMPHET ER 20 MG PO CP24: 20.0000 mg | ORAL_CAPSULE | ORAL | Status: DC

## 2012-03-24 ENCOUNTER — Other Ambulatory Visit: Payer: 59 | Admitting: Internal Medicine

## 2012-03-24 DIAGNOSIS — Z79899 Other long term (current) drug therapy: Secondary | ICD-10-CM

## 2012-03-24 DIAGNOSIS — I10 Essential (primary) hypertension: Secondary | ICD-10-CM

## 2012-03-24 DIAGNOSIS — E559 Vitamin D deficiency, unspecified: Secondary | ICD-10-CM

## 2012-03-24 DIAGNOSIS — E039 Hypothyroidism, unspecified: Secondary | ICD-10-CM

## 2012-03-24 DIAGNOSIS — M7918 Myalgia, other site: Secondary | ICD-10-CM

## 2012-03-25 LAB — CBC WITH DIFFERENTIAL/PLATELET
Basophils Absolute: 0 10*3/uL (ref 0.0–0.1)
Basophils Relative: 0 % (ref 0–1)
Eosinophils Absolute: 0.2 10*3/uL (ref 0.0–0.7)
Eosinophils Relative: 2 % (ref 0–5)
HCT: 51.6 % — ABNORMAL HIGH (ref 36.0–46.0)
Hemoglobin: 16.2 g/dL — ABNORMAL HIGH (ref 12.0–15.0)
Lymphocytes Relative: 33 % (ref 12–46)
Lymphs Abs: 3 10*3/uL (ref 0.7–4.0)
MCH: 31.8 pg (ref 26.0–34.0)
MCHC: 31.4 g/dL (ref 30.0–36.0)
MCV: 101.2 fL — ABNORMAL HIGH (ref 78.0–100.0)
Monocytes Absolute: 0.5 10*3/uL (ref 0.1–1.0)
Monocytes Relative: 6 % (ref 3–12)
Neutro Abs: 5.4 10*3/uL (ref 1.7–7.7)
Neutrophils Relative %: 59 % (ref 43–77)
Platelets: 204 10*3/uL (ref 150–400)
RBC: 5.1 MIL/uL (ref 3.87–5.11)
RDW: 13.2 % (ref 11.5–15.5)
WBC: 9.2 10*3/uL (ref 4.0–10.5)

## 2012-03-25 LAB — COMPREHENSIVE METABOLIC PANEL
ALT: 24 U/L (ref 0–35)
AST: 26 U/L (ref 0–37)
Albumin: 4.5 g/dL (ref 3.5–5.2)
Alkaline Phosphatase: 68 U/L (ref 39–117)
BUN: 11 mg/dL (ref 6–23)
CO2: 30 mEq/L (ref 19–32)
Calcium: 9.7 mg/dL (ref 8.4–10.5)
Chloride: 104 mEq/L (ref 96–112)
Creat: 0.76 mg/dL (ref 0.50–1.10)
Glucose, Bld: 91 mg/dL (ref 70–99)
Potassium: 4.5 mEq/L (ref 3.5–5.3)
Sodium: 142 mEq/L (ref 135–145)
Total Bilirubin: 0.4 mg/dL (ref 0.3–1.2)
Total Protein: 6.5 g/dL (ref 6.0–8.3)

## 2012-03-25 LAB — LIPID PANEL
Cholesterol: 197 mg/dL (ref 0–200)
HDL: 58 mg/dL (ref >39)
LDL Cholesterol: 119 mg/dL — ABNORMAL HIGH (ref 0–99)
Total CHOL/HDL Ratio: 3.4 Ratio
Triglycerides: 100 mg/dL (ref <150)
VLDL: 20 mg/dL (ref 0–40)

## 2012-03-25 LAB — VITAMIN D 25 HYDROXY (VIT D DEFICIENCY, FRACTURES): Vit D, 25-Hydroxy: 32 ng/mL (ref 30–89)

## 2012-03-25 LAB — TSH: TSH: 1.316 u[IU]/mL (ref 0.350–4.500)

## 2012-03-27 ENCOUNTER — Encounter: Payer: 59 | Admitting: Internal Medicine

## 2012-03-31 ENCOUNTER — Telehealth: Payer: Self-pay | Admitting: Internal Medicine

## 2012-03-31 NOTE — Telephone Encounter (Signed)
Please call in # 60 with 3 refills one po bid

## 2012-03-31 NOTE — Telephone Encounter (Signed)
Sp w/Ashley @ [facility].  Refilled RX per Dr. Ellwood Dense orders #60 w/3 refills.

## 2012-04-07 ENCOUNTER — Encounter: Payer: Self-pay | Admitting: Internal Medicine

## 2012-04-07 ENCOUNTER — Ambulatory Visit (INDEPENDENT_AMBULATORY_CARE_PROVIDER_SITE_OTHER): Payer: 59 | Admitting: Internal Medicine

## 2012-04-07 VITALS — BP 102/80 | HR 76 | Temp 98.0°F | Wt 182.0 lb

## 2012-04-07 DIAGNOSIS — Z87891 Personal history of nicotine dependence: Secondary | ICD-10-CM

## 2012-04-07 DIAGNOSIS — F32A Depression, unspecified: Secondary | ICD-10-CM

## 2012-04-07 DIAGNOSIS — M7918 Myalgia, other site: Secondary | ICD-10-CM

## 2012-04-07 DIAGNOSIS — Z0000 Encounter for general adult medical examination without abnormal findings: Secondary | ICD-10-CM

## 2012-04-07 DIAGNOSIS — IMO0001 Reserved for inherently not codable concepts without codable children: Secondary | ICD-10-CM

## 2012-04-07 DIAGNOSIS — E039 Hypothyroidism, unspecified: Secondary | ICD-10-CM

## 2012-04-07 DIAGNOSIS — F341 Dysthymic disorder: Secondary | ICD-10-CM

## 2012-04-07 DIAGNOSIS — D690 Allergic purpura: Secondary | ICD-10-CM

## 2012-04-07 DIAGNOSIS — Z8669 Personal history of other diseases of the nervous system and sense organs: Secondary | ICD-10-CM

## 2012-04-07 DIAGNOSIS — F988 Other specified behavioral and emotional disorders with onset usually occurring in childhood and adolescence: Secondary | ICD-10-CM

## 2012-04-07 DIAGNOSIS — F329 Major depressive disorder, single episode, unspecified: Secondary | ICD-10-CM

## 2012-04-07 DIAGNOSIS — I10 Essential (primary) hypertension: Secondary | ICD-10-CM

## 2012-04-07 DIAGNOSIS — F419 Anxiety disorder, unspecified: Secondary | ICD-10-CM

## 2012-04-07 LAB — POCT URINALYSIS DIPSTICK
Bilirubin, UA: NEGATIVE
Blood, UA: NEGATIVE
Glucose, UA: NEGATIVE
Leukocytes, UA: NEGATIVE
Nitrite, UA: NEGATIVE
Protein, UA: NEGATIVE
Spec Grav, UA: 1.015
Urobilinogen, UA: NEGATIVE
pH, UA: 6.5

## 2012-05-09 ENCOUNTER — Telehealth: Payer: Self-pay | Admitting: Internal Medicine

## 2012-05-09 MED ORDER — AMPHETAMINE-DEXTROAMPHET ER 20 MG PO CP24: 20.0000 mg | ORAL_CAPSULE | ORAL | Status: DC

## 2012-05-09 NOTE — Telephone Encounter (Signed)
Rx for Adderall 20 mg daily printed for Dr. Lenord Fellers to sign/ 90 day supply.

## 2012-07-12 ENCOUNTER — Ambulatory Visit
Admission: RE | Admit: 2012-07-12 | Discharge: 2012-07-12 | Disposition: A | Discharge status: Home / Self Care | Payer: 59 | Source: Ambulatory Visit | Attending: Internal Medicine | Admitting: Internal Medicine

## 2012-07-12 DIAGNOSIS — Z1231 Encounter for screening mammogram for malignant neoplasm of breast: Secondary | ICD-10-CM

## 2012-08-07 DIAGNOSIS — Z87891 Personal history of nicotine dependence: Secondary | ICD-10-CM | POA: Insufficient documentation

## 2012-08-07 NOTE — Progress Notes (Signed)
Subjective:    Patient ID: [name], female    DOB: 1966/05/20, 46 y.o.   MRN: [id number]  Gastrophageal Reflux Associated symptoms include fatigue.   46 year old white female for health maintenance and evaluation of complex medical problems. Patient was hospitalized in mid October 2006 with severe epigastric pain nausea and diarrhea. Prior to that had developed itchy hands and arthralgias in joints. She was treated with a short course of steroids September 2006. In early October 2006 had purpuric lesions on lower extremities. Liver functions were normal. White blood cell count was 11,700 and multiple laboratory studies were done. ANA and rheumatoid factor were negative. RMSF titer and negative. 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 Other studies were performed looking for possible malignancy. Sedimentation rate was 7. CMV and EBV were negative. History of GE reflux and had been taking Prilosec. Subsequently developed lower GI bleeding. Had colonoscopy while being hospitalized and was found to have an inflamed terminal ileum. She had maroon colored stools. Was treated with steroids, IVIG and was hospitalized in the intensive care unit. Hemoglobin dropped to 9.4 g. An MRA was performed of the celiac access which proved to be negative for ischemic bowel. She was discharged on September 27 2005 but got  readmitted 10/03/2005 with severe abdominal pain but no GI bleeding. CT done on October 29 showed terminal ileitis and diffuse colitis. She also had marked ascites. She was complaining of arthralgias. She had been treated with Cipro and Flagyl as an outpatient. She developed tea colored urine with hematuria. 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 Subsequently was transferred to [facility]. Was diagnosed with HS purpura and was treated with high-dose steroids which she remained on for several  months undergoing taper. She was seen by rheumatologist at [facility] (Dr. Yates Decamp) in followup. She has had no recurrence. However in September 2009 was hospitalized with acute gastroenteritis secondary to Clostridium difficile. Because of this severe illness, she has developed anxiety. She worries that she could have more issues particularly with her kidneys.  History of sleep apnea. History of asthma. Continues to smoke cigarettes although being counseled about smoking many times in the past. History of migraine headaches. History of GE reflux. History of persistent arthralgias and musculoskeletal pain treated with Vicodin. History of attention deficit disorder. History of anxiety depression. Asthma flares up with upper respiratory infections.  History of cervical radiculopathy 2012. She had an osteophyte causing some mild foraminal stenosis and impingement on MRI. She went to see Dr. Venetia Maxon.  During hospitalization 2006 had 2-D echocardiogram which was normal. In 2009 saw Dr. Elsie Lincoln for upper anterior and posterior back pain. Had normal nuclear medicine study. EKG was normal. Echo was normal.  She is not on CPAP. Weight loss is been recommended. Has seen Dr. Mat Carne and on for evaluation in the past. Sleep apnea is thought to be mild. Had sleep study showing desaturation to 87% with snoring. Respiratory distress index of 10.5 per hour.  She has hypertension treated with Vasotec. History of hypothyroidism treated with levothyroxine. Anxiety and depression treated with Pristiq. Takes Adderall for attention deficit issues.  Social history: Married with 3 sons. 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 Does not consume alcohol. Has smoked about a half-pack per day for well over 20 years.  Family history: Both  parents with hypertension. Father is had a stroke. 2 brothers in good health. No sisters.  History of  endometrial polyp causing menorrhagia removed by Dr. Thomasena Edis November 2003. 3 C-sections 1995, 1996, 1999. Appendectomy in the early 1970s. No fractures.    Review of Systems  Constitutional: Positive for fatigue.  Eyes: Negative.   Respiratory:       History of wheezing with respiratory infection. History of mild sleep apnea  Cardiovascular: Negative.   Gastrointestinal:       History of GE reflux. History of C. difficile diarrhea. History of terminal ileitis associated with acute bout of at bedtime purpura  Genitourinary: Negative.   Musculoskeletal: Positive for arthralgias.       History of cervical radiculopathy  Neurological:       History of migraine headaches  Hematological: Negative.        Objective:   Physical Exam  Vitals reviewed. Constitutional: She is oriented to person, place, and time. She appears well-developed and well-nourished. No distress.  HENT:  Head: Normocephalic and atraumatic.  Right Ear: External ear normal.  Left Ear: External ear normal.  Mouth/Throat: Oropharynx is clear and moist.  Eyes: Conjunctivae and EOM are normal. Pupils are equal, round, and reactive to light. Right eye exhibits no discharge. Left eye exhibits no discharge. No scleral icterus.  Neck: Neck supple. No JVD present. No thyromegaly present.  Cardiovascular: Normal rate, regular rhythm, normal heart sounds and intact distal pulses.   No murmur heard. Pulmonary/Chest: Effort normal and breath sounds normal. She has no wheezes. She has no rales.       Breasts normal female  Abdominal: Soft. Bowel sounds are normal. She exhibits no distension and no mass. There is no tenderness. There is no rebound and no guarding.       No hepatosplenomegaly  Genitourinary:       Deferred  Musculoskeletal: Normal range of motion. She exhibits no edema.  Lymphadenopathy:    She has no cervical adenopathy.  Neurological: She is alert and oriented to person, place, and time. She has normal  reflexes. No cranial nerve deficit. Coordination normal.  Skin: Skin is warm and dry. She is not diaphoretic.  Psychiatric: She has a normal mood and affect. Her behavior is normal. Judgment and thought content normal.          Assessment & Plan:  Hypertension-stable on Vasotec  Hypothyroidism-on thyroid replacement therapy. 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 Venetia Maxon  Anxiety depression  Attention deficit disorder-treated with Adderall  History of endometrial polyp removed 2003  Status post appendectomy in the 1970s  History of Clostridium difficile gastroenteritis 2009  Obesity-unable to exercise very much because musculoskeletal pain. Pain seems to have persisted after bout of HS purpura negative workup in 2006 for lupus and rheumatoid arthritis.  History of mild sleep apnea-not on CPAP  Plan: Continue same medications to return in 6 months. Give some consideration to trying CPAP. No change in medications at this point in time. Spent over 25 minutes reviewing medical history, of evaluating medications, a dressing prescription refills, talking with patient about situational stress.

## 2012-08-15 ENCOUNTER — Other Ambulatory Visit: Payer: Self-pay

## 2012-08-15 MED ORDER — AMPHETAMINE-DEXTROAMPHET ER 20 MG PO CP24: 20.0000 mg | ORAL_CAPSULE | ORAL | Status: DC

## 2012-09-20 ENCOUNTER — Telehealth: Payer: Self-pay | Admitting: Internal Medicine

## 2012-09-21 ENCOUNTER — Other Ambulatory Visit: Payer: Self-pay

## 2012-09-21 MED ORDER — AMPHETAMINE-DEXTROAMPHET ER 20 MG PO CP24: 20.0000 mg | ORAL_CAPSULE | ORAL | Status: DC

## 2012-09-21 NOTE — Telephone Encounter (Signed)
LMOM for patient that her Rx is ready for her to pick up.

## 2012-10-09 ENCOUNTER — Encounter: Payer: Self-pay | Admitting: Internal Medicine

## 2012-10-09 ENCOUNTER — Ambulatory Visit (INDEPENDENT_AMBULATORY_CARE_PROVIDER_SITE_OTHER): Payer: 59 | Admitting: Internal Medicine

## 2012-10-09 VITALS — BP 118/76 | HR 80 | Temp 98.8°F | Wt 186.0 lb

## 2012-10-09 DIAGNOSIS — F988 Other specified behavioral and emotional disorders with onset usually occurring in childhood and adolescence: Secondary | ICD-10-CM

## 2012-10-09 DIAGNOSIS — E039 Hypothyroidism, unspecified: Secondary | ICD-10-CM

## 2012-10-09 DIAGNOSIS — M19049 Primary osteoarthritis, unspecified hand: Secondary | ICD-10-CM

## 2012-10-09 LAB — TSH: TSH: 1.66 u[IU]/mL (ref 0.350–4.500)

## 2012-10-09 MED ORDER — LEVOTHYROXINE SODIUM 75 MCG PO TABS: 75.0000 ug | ORAL_TABLET | Freq: Every day | ORAL | Status: DC

## 2012-10-09 NOTE — Patient Instructions (Addendum)
Continue same meds and return in 6 months 

## 2012-10-20 ENCOUNTER — Telehealth: Payer: Self-pay | Admitting: Internal Medicine

## 2012-10-20 NOTE — Telephone Encounter (Signed)
I think a 3 month trial is OK

## 2012-10-20 NOTE — Telephone Encounter (Signed)
Spoke with patient to advise per Dr. Lenord Fellers that a 3 month trial is OK.  Patient states she actually has a follow-up appointment with Dr. Jennette Kettle some time in December.  She will call us back after that visit to update [place] on the progress of the patch.

## 2012-10-24 ENCOUNTER — Other Ambulatory Visit: Payer: Self-pay | Admitting: Internal Medicine

## 2012-10-31 ENCOUNTER — Telehealth: Payer: Self-pay | Admitting: Internal Medicine

## 2012-10-31 NOTE — Telephone Encounter (Signed)
Please print up 90 day supply.

## 2012-11-01 ENCOUNTER — Other Ambulatory Visit: Payer: Self-pay

## 2012-11-01 MED ORDER — AMPHETAMINE-DEXTROAMPHET ER 20 MG PO CP24: 20.0000 mg | ORAL_CAPSULE | ORAL | Status: DC

## 2012-11-03 NOTE — Progress Notes (Signed)
  Subjective:    Patient ID: [name], female    DOB: [birth year], 46 y.o.   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 History of hypothyroidism, hypertension, anxiety and depression, musculoskeletal pain, attention deficit disorder. Remote history of at H-S purpura treated at [facility] in 2006. Patient continues to smoke. We have talked about smoking cessation. See physical exam 04/07/2012 for details of H-S  purpura treatment. She has a history of sleep apnea and asthma. History of migraine headaches. Asthma flares up with respiratory infections.  She is not on CPAP. Weight loss has been recommended instead. Dr. Shelle Iron evaluated this in the past.  Hypertension history treated with ACE inhibitor. Hypothyroidism treated with levothyroxin. Anxiety depression treated with Pristiq. Takes Adderall for attention deficit issues.   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 Does not consume alcohol. Has smoked about half pack cigarettes daily for well over 20 years. Married with 3 sons.  Family history: Both parents with hypertension. Father had a stroke. 2 brothers in good health. No sisters. Additional history: Endometrial polyp causing menorrhagia removed by Dr. Thomasena Edis November 2003. C-sections 1990 04/24/1995, 1999. Appendectomy in early 1970s. No fractures.      Review of Systems has osteoarthritis of TMs     Objective:   Physical Exam Heberden's and Bouchard's nodes bilaterally. Neck is supple without thyromegaly. Chest clear to auscultation. Cardiac exam regular rate and rhythm normal S1 and S2 without murmurs or gallops. Extremities without edema. Skin is warm and dry without rashes.       Assessment & Plan:  Hypertension-stable on ACE inhibitor  History of smoking-unwilling to quit  Hypothyroidism-stable on thyroid replacement  Anxiety  depression-stable on Pristiq  History of attention deficit disorder-stable on Adderall  Osteoarthritis of the hands  Plan: May take Vicodin sparingly for musculoskeletal pain. Return in 6 months for physical examination. Has had influenza immunization through employment.

## 2012-11-06 ENCOUNTER — Ambulatory Visit (INDEPENDENT_AMBULATORY_CARE_PROVIDER_SITE_OTHER): Payer: 59 | Admitting: Internal Medicine

## 2012-11-06 ENCOUNTER — Encounter: Payer: Self-pay | Admitting: Internal Medicine

## 2012-11-06 VITALS — BP 132/84 | HR 84 | Temp 98.2°F | Wt 188.0 lb

## 2012-11-06 DIAGNOSIS — T7840XA Allergy, unspecified, initial encounter: Secondary | ICD-10-CM

## 2012-11-06 DIAGNOSIS — Z888 Allergy status to other drugs, medicaments and biological substances status: Secondary | ICD-10-CM

## 2012-11-06 NOTE — Progress Notes (Signed)
  Subjective:    Patient ID: [name], female    DOB: January 06, 1966, 46 y.o.   MRN: [id number]  HPI Patient came by on Wednesday, November 27 pick up an Adderall written prescription and mentioned that she had developed some respiratory congestion. She has a history of asthma. She stated she had  some medication on hand should symptoms worsen over the Thanksgiving holiday. Was told to start Sterapred DS 10 mg 6 day dosepak if wheezing developed and to start Levaquin 500 mg daily should respiratory symptoms worsen with discolored sputum. On Thursday, patient began to break out in a generalized pruritic rash on stomach, hips,  breasts and anterior chest as well as lower legs. She became alarmed because of remote history of HS purpura. She took Benadryl for itching. She thought it might be do to a change from Synthroid to levothyroxin or do to an estrogen patch recently prescribed by GYN. I do not think these have caused a rash. Possibilities include viral syndrome or allergic reaction to Levaquin.    Review of Systems     Objective:   Physical Exam she has generalized pruritic slightly papular rash on anterior chest, abdomen and mottled appearance of her legs. Chest is clear to auscultation.        Assessment & Plan:  Probable drug reaction to Levaquin-labile patient allergic to Levaquin  Plan: Patient will finish Sterapred DS 6 day dosepak today. I have given her a   prescription for another dosepak to take an additional 6 days. She will stop Levaquin. She may take Zantac 150 mg twice daily for histamine control.

## 2012-11-06 NOTE — Patient Instructions (Addendum)
Stop Levaquin. Take an additional 6 day 10 mg Sterapred Dosepak. May take Benadryl for itching. May take Zantac 150 mg twice daily for histamine blockage.

## 2012-11-17 ENCOUNTER — Ambulatory Visit (INDEPENDENT_AMBULATORY_CARE_PROVIDER_SITE_OTHER): Payer: 59 | Admitting: Internal Medicine

## 2012-11-17 VITALS — Temp 98.6°F

## 2012-11-17 DIAGNOSIS — J329 Chronic sinusitis, unspecified: Secondary | ICD-10-CM

## 2012-11-17 DIAGNOSIS — J069 Acute upper respiratory infection, unspecified: Secondary | ICD-10-CM

## 2012-11-17 MED ORDER — METHYLPREDNISOLONE ACETATE 80 MG/ML IJ SUSP: 80.0000 mg | Freq: Once | INTRAMUSCULAR | Status: DC

## 2012-11-22 ENCOUNTER — Other Ambulatory Visit: Payer: Self-pay

## 2012-11-22 ENCOUNTER — Encounter (HOSPITAL_COMMUNITY)
Admission: EM | Disposition: A | Discharge status: Home / Self Care | Payer: Self-pay | Source: Home / Self Care | Attending: Cardiovascular Disease

## 2012-11-22 ENCOUNTER — Encounter (HOSPITAL_COMMUNITY): Payer: Self-pay

## 2012-11-22 ENCOUNTER — Emergency Department (HOSPITAL_COMMUNITY): Payer: 59

## 2012-11-22 ENCOUNTER — Inpatient Hospital Stay (HOSPITAL_COMMUNITY)
Admission: EM | Admit: 2012-11-22 | Discharge: 2012-11-24 | DRG: 282 | Disposition: A | Discharge status: Home / Self Care | Payer: 59 | Attending: Cardiovascular Disease | Admitting: Cardiovascular Disease

## 2012-11-22 DIAGNOSIS — I201 Angina pectoris with documented spasm: Secondary | ICD-10-CM | POA: Diagnosis present

## 2012-11-22 DIAGNOSIS — F17200 Nicotine dependence, unspecified, uncomplicated: Secondary | ICD-10-CM | POA: Diagnosis present

## 2012-11-22 DIAGNOSIS — R079 Chest pain, unspecified: Secondary | ICD-10-CM

## 2012-11-22 DIAGNOSIS — E039 Hypothyroidism, unspecified: Secondary | ICD-10-CM | POA: Diagnosis present

## 2012-11-22 DIAGNOSIS — I213 ST elevation (STEMI) myocardial infarction of unspecified site: Secondary | ICD-10-CM

## 2012-11-22 DIAGNOSIS — F329 Major depressive disorder, single episode, unspecified: Secondary | ICD-10-CM | POA: Diagnosis present

## 2012-11-22 DIAGNOSIS — Z720 Tobacco use: Secondary | ICD-10-CM

## 2012-11-22 DIAGNOSIS — Z880 Allergy status to penicillin: Secondary | ICD-10-CM

## 2012-11-22 DIAGNOSIS — I10 Essential (primary) hypertension: Secondary | ICD-10-CM | POA: Diagnosis present

## 2012-11-22 DIAGNOSIS — I214 Non-ST elevation (NSTEMI) myocardial infarction: Secondary | ICD-10-CM

## 2012-11-22 DIAGNOSIS — I2510 Atherosclerotic heart disease of native coronary artery without angina pectoris: Secondary | ICD-10-CM | POA: Diagnosis present

## 2012-11-22 DIAGNOSIS — E785 Hyperlipidemia, unspecified: Secondary | ICD-10-CM | POA: Diagnosis present

## 2012-11-22 DIAGNOSIS — G43909 Migraine, unspecified, not intractable, without status migrainosus: Secondary | ICD-10-CM | POA: Diagnosis present

## 2012-11-22 DIAGNOSIS — I2109 ST elevation (STEMI) myocardial infarction involving other coronary artery of anterior wall: Principal | ICD-10-CM | POA: Diagnosis present

## 2012-11-22 DIAGNOSIS — Z886 Allergy status to analgesic agent status: Secondary | ICD-10-CM

## 2012-11-22 DIAGNOSIS — Z8669 Personal history of other diseases of the nervous system and sense organs: Secondary | ICD-10-CM

## 2012-11-22 DIAGNOSIS — F988 Other specified behavioral and emotional disorders with onset usually occurring in childhood and adolescence: Secondary | ICD-10-CM | POA: Diagnosis present

## 2012-11-22 DIAGNOSIS — K219 Gastro-esophageal reflux disease without esophagitis: Secondary | ICD-10-CM | POA: Diagnosis present

## 2012-11-22 DIAGNOSIS — F419 Anxiety disorder, unspecified: Secondary | ICD-10-CM | POA: Diagnosis present

## 2012-11-22 DIAGNOSIS — F411 Generalized anxiety disorder: Secondary | ICD-10-CM | POA: Diagnosis present

## 2012-11-22 DIAGNOSIS — F3289 Other specified depressive episodes: Secondary | ICD-10-CM | POA: Diagnosis present

## 2012-11-22 DIAGNOSIS — R7309 Other abnormal glucose: Secondary | ICD-10-CM | POA: Diagnosis present

## 2012-11-22 DIAGNOSIS — Z87891 Personal history of nicotine dependence: Secondary | ICD-10-CM

## 2012-11-22 DIAGNOSIS — F32A Depression, unspecified: Secondary | ICD-10-CM | POA: Diagnosis present

## 2012-11-22 DIAGNOSIS — I219 Acute myocardial infarction, unspecified: Secondary | ICD-10-CM

## 2012-11-22 DIAGNOSIS — E876 Hypokalemia: Secondary | ICD-10-CM | POA: Diagnosis present

## 2012-11-22 HISTORY — DX: Tobacco use: Z72.0

## 2012-11-22 HISTORY — PX: CARDIAC CATHETERIZATION: SHX172

## 2012-11-22 HISTORY — DX: Depression, unspecified: F32.A

## 2012-11-22 HISTORY — DX: Crohn's disease of small intestine without complications: K50.00

## 2012-11-22 HISTORY — DX: Gastro-esophageal reflux disease without esophagitis: K21.9

## 2012-11-22 HISTORY — DX: Migraine, unspecified, not intractable, without status migrainosus: G43.909

## 2012-11-22 HISTORY — DX: Angina pectoris with documented spasm: I20.1

## 2012-11-22 HISTORY — DX: Hypothyroidism, unspecified: E03.9

## 2012-11-22 HISTORY — DX: Essential (primary) hypertension: I10

## 2012-11-22 HISTORY — DX: Major depressive disorder, single episode, unspecified: F32.9

## 2012-11-22 HISTORY — PX: LEFT HEART CATHETERIZATION WITH CORONARY ANGIOGRAM: SHX5451

## 2012-11-22 HISTORY — DX: Gastrointestinal hemorrhage, unspecified: K92.2

## 2012-11-22 HISTORY — DX: Attention-deficit hyperactivity disorder, unspecified type: F90.9

## 2012-11-22 HISTORY — DX: Sleep apnea, unspecified: G47.30

## 2012-11-22 HISTORY — DX: Allergic purpura: D69.0

## 2012-11-22 HISTORY — DX: Atherosclerotic heart disease of native coronary artery without angina pectoris: I25.10

## 2012-11-22 HISTORY — DX: Unspecified asthma, uncomplicated: J45.909

## 2012-11-22 HISTORY — DX: Chronic kidney disease, unspecified: N18.9

## 2012-11-22 HISTORY — DX: Anxiety disorder, unspecified: F41.9

## 2012-11-22 LAB — CBC WITH DIFFERENTIAL/PLATELET
Basophils Absolute: 0 10*3/uL (ref 0.0–0.1)
Basophils Relative: 0 % (ref 0–1)
Eosinophils Absolute: 0.1 10*3/uL (ref 0.0–0.7)
Eosinophils Relative: 1 % (ref 0–5)
HCT: 44.7 % (ref 36.0–46.0)
Hemoglobin: 14.8 g/dL (ref 12.0–15.0)
Lymphocytes Relative: 11 % — ABNORMAL LOW (ref 12–46)
Lymphs Abs: 1.4 10*3/uL (ref 0.7–4.0)
MCH: 31.7 pg (ref 26.0–34.0)
MCHC: 33.1 g/dL (ref 30.0–36.0)
MCV: 95.7 fL (ref 78.0–100.0)
Monocytes Absolute: 0.4 10*3/uL (ref 0.1–1.0)
Monocytes Relative: 3 % (ref 3–12)
Neutro Abs: 10.5 10*3/uL — ABNORMAL HIGH (ref 1.7–7.7)
Neutrophils Relative %: 85 % — ABNORMAL HIGH (ref 43–77)
Platelets: 163 10*3/uL (ref 150–400)
RBC: 4.67 MIL/uL (ref 3.87–5.11)
RDW: 12.3 % (ref 11.5–15.5)
WBC: 12.4 10*3/uL — ABNORMAL HIGH (ref 4.0–10.5)

## 2012-11-22 LAB — URINALYSIS, ROUTINE W REFLEX MICROSCOPIC
Bilirubin Urine: NEGATIVE
Glucose, UA: NEGATIVE mg/dL
Hgb urine dipstick: NEGATIVE
Ketones, ur: NEGATIVE mg/dL
Leukocytes, UA: NEGATIVE
Nitrite: NEGATIVE
Protein, ur: NEGATIVE mg/dL
Specific Gravity, Urine: 1.016 (ref 1.005–1.030)
Urobilinogen, UA: 1 mg/dL (ref 0.0–1.0)
pH: 7 (ref 5.0–8.0)

## 2012-11-22 LAB — TROPONIN I
Troponin I: 0.38 ng/mL (ref <0.30)
Troponin I: >20 ng/mL (ref <0.30)

## 2012-11-22 LAB — BASIC METABOLIC PANEL
BUN: 10 mg/dL (ref 6–23)
CO2: 26 mEq/L (ref 19–32)
Calcium: 8.9 mg/dL (ref 8.4–10.5)
Chloride: 102 mEq/L (ref 96–112)
Creatinine, Ser: 0.7 mg/dL (ref 0.50–1.10)
GFR calc Af Amer: >90 mL/min (ref >90)
GFR calc non Af Amer: >90 mL/min (ref >90)
Glucose, Bld: 167 mg/dL — ABNORMAL HIGH (ref 70–99)
Potassium: 3.4 mEq/L — ABNORMAL LOW (ref 3.5–5.1)
Sodium: 139 mEq/L (ref 135–145)

## 2012-11-22 SURGERY — LEFT HEART CATHETERIZATION WITH CORONARY ANGIOGRAM
Anesthesia: LOCAL

## 2012-11-22 MED ORDER — MIDAZOLAM HCL 2 MG/2ML IJ SOLN
INTRAMUSCULAR | Status: AC
  Filled 2012-11-22: qty 2

## 2012-11-22 MED ORDER — NITROGLYCERIN IN D5W 200-5 MCG/ML-% IV SOLN: 5.0000 ug/min | INTRAVENOUS | Status: DC

## 2012-11-22 MED ORDER — PANTOPRAZOLE SODIUM 40 MG PO TBEC
40.0000 mg | DELAYED_RELEASE_TABLET | Freq: Two times a day (BID) | ORAL | Status: DC
  Administered 2012-11-22 – 2012-11-24 (×4): 40 mg via ORAL
  Filled 2012-11-22 (×4): qty 1

## 2012-11-22 MED ORDER — LEVOTHYROXINE SODIUM 75 MCG PO TABS
75.0000 ug | ORAL_TABLET | Freq: Every day | ORAL | Status: DC
  Administered 2012-11-23 – 2012-11-24 (×2): 75 ug via ORAL
  Filled 2012-11-22 (×3): qty 1

## 2012-11-22 MED ORDER — ONDANSETRON HCL 4 MG/2ML IJ SOLN
4.0000 mg | Freq: Four times a day (QID) | INTRAMUSCULAR | Status: DC | PRN
  Administered 2012-11-22: 4 mg via INTRAVENOUS
  Filled 2012-11-22: qty 2

## 2012-11-22 MED ORDER — ATORVASTATIN CALCIUM 40 MG PO TABS
40.0000 mg | ORAL_TABLET | Freq: Every day | ORAL | Status: DC
  Administered 2012-11-22 – 2012-11-23 (×2): 40 mg via ORAL
  Filled 2012-11-22 (×3): qty 1

## 2012-11-22 MED ORDER — SODIUM CHLORIDE 0.9 % IV SOLN: INTRAVENOUS | Status: AC

## 2012-11-22 MED ORDER — POTASSIUM CHLORIDE CRYS ER 20 MEQ PO TBCR
40.0000 meq | EXTENDED_RELEASE_TABLET | Freq: Once | ORAL | Status: AC
  Administered 2012-11-22: 40 meq via ORAL
  Filled 2012-11-22: qty 2

## 2012-11-22 MED ORDER — HYDROCODONE-ACETAMINOPHEN 5-325 MG PO TABS
1.0000 | ORAL_TABLET | Freq: Four times a day (QID) | ORAL | Status: DC | PRN
  Administered 2012-11-22: 21:00:00 1 via ORAL
  Filled 2012-11-22: qty 1

## 2012-11-22 MED ORDER — FENTANYL CITRATE 0.05 MG/ML IJ SOLN
INTRAMUSCULAR | Status: AC
  Filled 2012-11-22: qty 2

## 2012-11-22 MED ORDER — ASPIRIN 81 MG PO CHEW
162.0000 mg | CHEWABLE_TABLET | Freq: Once | ORAL | Status: DC
  Filled 2012-11-22: qty 2

## 2012-11-22 MED ORDER — ACETAMINOPHEN 325 MG PO TABS
650.0000 mg | ORAL_TABLET | ORAL | Status: DC | PRN
  Administered 2012-11-23: 06:00:00 650 mg via ORAL
  Filled 2012-11-22: qty 2

## 2012-11-22 MED ORDER — ASPIRIN EC 81 MG PO TBEC
81.0000 mg | DELAYED_RELEASE_TABLET | Freq: Every day | ORAL | Status: DC
  Administered 2012-11-23 – 2012-11-24 (×2): 81 mg via ORAL
  Filled 2012-11-22 (×2): qty 1

## 2012-11-22 MED ORDER — MORPHINE SULFATE 4 MG/ML IJ SOLN
4.0000 mg | Freq: Once | INTRAMUSCULAR | Status: AC
  Administered 2012-11-22: 4 mg via INTRAVENOUS
  Filled 2012-11-22: qty 1

## 2012-11-22 MED ORDER — NITROGLYCERIN 0.2 MG/ML ON CALL CATH LAB
INTRAVENOUS | Status: AC
  Filled 2012-11-22: qty 1

## 2012-11-22 MED ORDER — LIDOCAINE HCL (PF) 1 % IJ SOLN
INTRAMUSCULAR | Status: AC
  Filled 2012-11-22: qty 30

## 2012-11-22 MED ORDER — NITROGLYCERIN IN D5W 200-5 MCG/ML-% IV SOLN
5.0000 ug/min | INTRAVENOUS | Status: DC
  Administered 2012-11-22: 5 ug/min via INTRAVENOUS
  Filled 2012-11-22: qty 250

## 2012-11-22 MED ORDER — AMLODIPINE BESYLATE 5 MG PO TABS
5.0000 mg | ORAL_TABLET | Freq: Every day | ORAL | Status: DC
  Administered 2012-11-23 – 2012-11-24 (×2): 5 mg via ORAL
  Filled 2012-11-22 (×2): qty 1

## 2012-11-22 MED ORDER — VENLAFAXINE HCL ER 75 MG PO CP24
75.0000 mg | ORAL_CAPSULE | Freq: Every day | ORAL | Status: DC
  Administered 2012-11-23 – 2012-11-24 (×2): 75 mg via ORAL
  Filled 2012-11-22 (×3): qty 1

## 2012-11-22 MED ORDER — CLOPIDOGREL BISULFATE 75 MG PO TABS
75.0000 mg | ORAL_TABLET | Freq: Every day | ORAL | Status: DC
  Administered 2012-11-23 – 2012-11-24 (×2): 75 mg via ORAL
  Filled 2012-11-22 (×2): qty 1

## 2012-11-22 MED ORDER — HEPARIN (PORCINE) IN NACL 2-0.9 UNIT/ML-% IJ SOLN
INTRAMUSCULAR | Status: AC
  Filled 2012-11-22: qty 2000

## 2012-11-22 MED ORDER — NITROGLYCERIN 0.4 MG SL SUBL
0.4000 mg | SUBLINGUAL_TABLET | SUBLINGUAL | Status: DC | PRN
  Administered 2012-11-22: 0.4 mg via SUBLINGUAL
  Filled 2012-11-22: qty 50

## 2012-11-22 MED ORDER — ENALAPRIL MALEATE 5 MG PO TABS
5.0000 mg | ORAL_TABLET | Freq: Every day | ORAL | Status: DC
  Administered 2012-11-23 – 2012-11-24 (×2): 5 mg via ORAL
  Filled 2012-11-22 (×2): qty 1

## 2012-11-22 MED ORDER — CLOPIDOGREL BISULFATE 75 MG PO TABS
300.0000 mg | ORAL_TABLET | Freq: Once | ORAL | Status: AC
  Administered 2012-11-22: 300 mg via ORAL
  Filled 2012-11-22: qty 4

## 2012-11-22 NOTE — ED Notes (Signed)
EKG handed to Dr. Rhunette Croft.  Extra copy placed in pt chart.  No old EKG in Epic

## 2012-11-22 NOTE — ED Notes (Signed)
Paged [facility] cardiology to [phone] per Dr. Rhunette Croft

## 2012-11-22 NOTE — Progress Notes (Signed)
Rt radial TR band deflated and removed per protocol, area cleansed w/ CHG wipe and redressed w/ 2x2/tegaderm.  Post radial cath care instructions reviewed.  Pt voices understanding.  Pt had c/o gradual headache w/ visual disturbances of squiggly lines.  Vicodin given with meal, visual disturbances resolved and headache greatly improved.  Friends at bedside.  No neuro deficit noted.  VSS.

## 2012-11-22 NOTE — ED Notes (Signed)
Patient states chest pain unchanged with 2 NTG and did not want the 3rd NTG.  Morphine 4 mg given for HA and chest pain.  Lab at bedside.

## 2012-11-22 NOTE — ED Notes (Signed)
EMS reports pt with c/o central chest pain with rads to left arm starting at 1pm

## 2012-11-22 NOTE — Progress Notes (Signed)
Troponin rose from 0.38 to >20.  Denies pain or complaints.  Informed Ronie Spies PA, advised RN can turn NTG off if SBP<100.

## 2012-11-22 NOTE — ED Notes (Signed)
Husband's cell phone number is : [phone]

## 2012-11-22 NOTE — H&P (Signed)
Patient seen, examined. Available data reviewed. Agree with findings, assessment, and plan as outlined by Ronie Spies, PA-C. The patient's physical exam is unrevealing. She is alert and oriented, in NAD. Heart is RRR without murmur. There is no edema.  The patient has a typical presentation of ACS, possibly related to coronary vasospasm. Her initial EKG in the field was diagnostic of STEMI, but ST changes have resolved completely and she is now chest pain-free. We elected to proceed with cardiac cath considering dynamic EKG changes, elevated troponin, and typical history of ACS.   Tonny Bollman, M.D. 11/22/2012 11:12 PM

## 2012-11-22 NOTE — ED Notes (Signed)
Pillow placed under left arm to attempt to help relieve her discomfort

## 2012-11-22 NOTE — ED Provider Notes (Addendum)
History     CSN: [id number]  Arrival date & time 11/22/12  1402   First MD Initiated Contact with Patient 11/22/12 1437      Chief Complaint  Patient presents with  . Chest Pain    (Consider location/radiation/quality/duration/timing/severity/associated sxs/prior treatment) HPI Comments: Pt with hx of HTN, smoking comes in with cc of chest pain. Pt has left sided chest pain, radiating to the shoulder. The pain is severe, no radiation to the back, and started around 1 pm while patient was cleaning. The pain is constant, with no aggravating or relieving factors, and no n/sob. She does endorse having some diaphoresis. Nitro given with no relief. No trauma, no hx of cocaine abuse, and no premature CAD in family. No neuro complains.  Patient is a 46 y.o. female presenting with chest pain. The history is provided by the patient.  Chest Pain Pertinent negatives for primary symptoms include no shortness of breath, no cough, no wheezing, no abdominal pain, no nausea and no vomiting.  Associated symptoms include diaphoresis.     Past Medical History  Diagnosis Date  . Hypertension   . Anxiety   . Hypothyroidism   . GERD (gastroesophageal reflux disease)   . ADHD (attention deficit hyperactivity disorder)     History reviewed. No pertinent past surgical history.  History reviewed. No pertinent family history.  History  Substance Use Topics  . Smoking status: Current Every Day Smoker    Types: Cigarettes  . Smokeless tobacco: Never Used  . Alcohol Use: No    OB History    Grav Para Term Preterm Abortions TAB SAB Ect Mult Living                  Review of Systems  Constitutional: Positive for diaphoresis. Negative for activity change.  HENT: Negative for facial swelling and neck pain.   Respiratory: Negative for cough, shortness of breath and wheezing.   Cardiovascular: Positive for chest pain.  Gastrointestinal: Negative for nausea, vomiting, abdominal pain, diarrhea,  constipation, blood in stool and abdominal distention.  Genitourinary: Negative for hematuria and difficulty urinating.  Skin: Negative for color change.  Neurological: Negative for speech difficulty.  Hematological: Does not bruise/bleed easily.  Psychiatric/Behavioral: Negative for confusion.    Allergies  Levaquin  Home Medications   Current Outpatient Rx  Name  Route  Sig  Dispense  Refill  . AMPHETAMINE-DEXTROAMPHET ER 20 MG PO CP24   Oral   Take 1 capsule (20 mg total) by mouth every morning.   30 capsule   0     Fill on or after 01/01/2013   . CALCIUM CARBONATE-VITAMIN D 500-200 MG-UNIT PO TABS   Oral   Take 1 tablet by mouth daily.         . DESVENLAFAXINE SUCCINATE ER 50 MG PO TB24   Oral   Take 1 tablet (50 mg total) by mouth daily.   90 tablet   3   . ENALAPRIL MALEATE 10 MG PO TABS   Oral   Take 1 tablet (10 mg total) by mouth daily.   30 tablet   PRN   . HYDROCODONE-ACETAMINOPHEN 5-500 MG PO TABS   Oral   Take 1 tablet by mouth every 4 (four) hours as needed. For pain         . LEVOTHYROXINE SODIUM 75 MCG PO TABS   Oral   Take 1 tablet (75 mcg total) by mouth daily.   90 tablet   3   .  OMEPRAZOLE 20 MG PO CPDR   Oral   Take 20 mg by mouth 2 (two) times daily.         . NYQUIL PO   Oral   Take 10 mLs by mouth at bedtime as needed. For cold           BP 123/69  Pulse 55  Temp 97.8 F (36.6 C) (Oral)  Resp 8  SpO2 98%  Physical Exam  Nursing note and vitals reviewed. Constitutional: She is oriented to person, place, and time. She appears well-developed.  HENT:  Head: Normocephalic and atraumatic.  Eyes: Conjunctivae normal and EOM are normal. Pupils are equal, round, and reactive to light.  Neck: Normal range of motion. Neck supple.  Cardiovascular: Normal rate, regular rhythm and normal heart sounds.   Pulmonary/Chest: Effort normal and breath sounds normal. No respiratory distress.  Abdominal: Soft. Bowel sounds are normal.  She exhibits no distension. There is no tenderness. There is no rebound and no guarding.  Neurological: She is alert and oriented to person, place, and time.  Skin: Skin is warm and dry.    ED Course  Procedures (including critical care time)  Labs Reviewed  CBC WITH DIFFERENTIAL - Abnormal; Notable for the following:    WBC 12.4 (*)     Neutrophils Relative 85 (*)     Neutro Abs 10.5 (*)     Lymphocytes Relative 11 (*)     All other components within normal limits  BASIC METABOLIC PANEL  TROPONIN I  URINALYSIS, ROUTINE W REFLEX MICROSCOPIC   Dg Chest Port 1 View  11/22/2012  *RADIOLOGY REPORT*  Clinical Data: Smoker with current history of hypertension, presenting with chest pain.  PORTABLE CHEST - 1 VIEW 11/22/2012 1559 hours:  Comparison: Two-view chest x-ray 10/03/2005.  Findings: Cardiac silhouette normal and mediastinal contours unremarkable for the AP portable technique.  Lungs clear. Pulmonary vascularity normal.  Bronchovascular markings normal.  No pneumothorax.  No pleural effusions.  IMPRESSION: No acute cardiopulmonary disease.   Original Report Authenticated By: Hulan Saas, M.D.      No diagnosis found.    MDM   Date: 11/22/2012  Rate: 58  Rhythm: normal sinus rhythm  QRS Axis: normal  Intervals: normal  ST/T Wave abnormalities: nonspecific ST/T changes - lead 3, avf have slight st depression and t wave inversion, v2 has ST elevation - likely j point.  Conduction Disutrbances:none  Narrative Interpretation:   Old EKG Reviewed: none available  Differential diagnosis includes: ACS syndrome CHF exacerbation Valvular disorder Myocarditis Pericarditis Pericardial effusion Pneumonia Pleural effusion Pulmonary edema PE Anemia Musculoskeletal pain  Pt comes in with cc of chest pain. Her cardiac risk factors are HTN, smoking. EKG does have some non specific findings, but the chest pain location and characteristics wise is typical..... Nitro and ASA  will be given. PT will be admitted for ACS rule out - even if the troponin is negative.   Derwood Kaplan, MD 11/22/12 1633   Date: 11/22/2012  Rate: 84  Rhythm: normal sinus rhythm  QRS Axis: normal  Intervals: normal  ST/T Wave abnormalities: nonspecific ST/T changes, ST elevations anteriorly and ST depressions inferiorly  Conduction Disutrbances:none  Narrative Interpretation:   Old EKG Reviewed: changes noted - v3 st elevations.  5:21 PM Troponin elevated. Patient informed, anticoagulation started. Cardiology consulted. She is chest pain free when i checked her, but is having intermittent chest pain - so nitro gtt ordered as well.   CRITICAL CARE Performed by: Derwood Kaplan  Total critical care time: 30 minutes  Critical care time was exclusive of separately billable procedures and treating other patients.  Critical care was necessary to treat or prevent imminent or life-threatening deterioration.  Critical care was time spent personally by me on the following activities: development of treatment plan with patient and/or surrogate as well as nursing, discussions with consultants, evaluation of patient's response to treatment, examination of patient, obtaining history from patient or surrogate, ordering and performing treatments and interventions, ordering and review of laboratory studies, ordering and review of radiographic studies, pulse oximetry and re-evaluation of patient's condition.      Derwood Kaplan, MD 11/22/12 [phone]

## 2012-11-22 NOTE — CV Procedure (Signed)
   Cardiac Catheterization Procedure Note  Name: [name] MRN: [id number] DOB: 06-04-1966  Procedure: Left Heart Cath, Selective Coronary Angiography, LV angiography  Indication: 46 year old woman with no past cardiac history. She is a cigarette smoker who has hypertension. She also takes Adderall for migraines. She presented today with substernal chest pain and her EKG initially demonstrated acute anterolateral ST segment elevation. After initial nitroglycerin and morphine, her ST segment changes improved and at the time of my evaluation the ST segment changes had completely resolved. Her chest pain had also resolved on IV nitroglycerin a low-dose. Her cardiac troponin was elevated and considering her dynamic EKG changes suggestive of acute injury, we elected to proceed with cardiac catheterization and possible PCI.   Procedural Details: The right wrist was prepped, draped, and anesthetized with 1% lidocaine. Using the modified Seldinger technique, a 5 French sheath was introduced into the right radial artery. 3 mg of verapamil was administered through the sheath, weight-based unfractionated heparin was administered intravenously. Standard Judkins catheters were used for selective coronary angiography and left ventriculography. Catheter exchanges were performed over an exchange length guidewire. There were no immediate procedural complications. A TR band was used for radial hemostasis at the completion of the procedure.  The patient was transferred to the post catheterization recovery area for further monitoring.  Procedural Findings: Hemodynamics: AO 119/79 LV 119/23  Coronary angiography: Coronary dominance: right  Left mainstem: Widely patent with no obstructive disease the  Left anterior descending (LAD): The LAD is widely patent throughout its course. There is minor luminal irregularity but no significant stenoses. The second diagonal branch has a subbranch demonstrating  subtotal occlusion with high-grade stenosis. This is less than a 1 mm vessel. There is no other obstructive disease visualized throughout the course of the LAD distribution.  Left circumflex (LCx): The left circumflex is large caliber and it is widely patent throughout. The obtuse marginal branches are patent.  Right coronary artery (RCA): The RCA is a large, dominant vessel. There is no significant stenosis throughout the proximal, mid, and distal portions. The PDA branch has mild nonobstructive plaque. The PLA branch is patent without significant stenosis.  Left ventriculography: Left ventricular systolic function is normal, LVEF is estimated at 55-65%, there is no significant mitral regurgitation   Final Conclusions:   1. Severe stenosis of the small diagonal subbranch, not amenable to PCI. 2. Minor nonobstructive LAD and right coronary artery stenoses 3. Normal left ventricular systolic function  Recommendations: Suspect this event was related to either her small diagonal occlusion or a more significant coronary vasospastic event. The extent of injury current on her initial ECG would suggest the latter. Smoking cessation will be imperative. We will start her on dual antiplatelet therapy with aspirin and Plavix. She should also be started on a calcium channel blocker. Will not initiate nitrates at this point considering her history of severe migraine headaches.  Tonny Bollman 11/22/2012, 6:44 PM

## 2012-11-22 NOTE — H&P (Signed)
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 She was in her usual state of health today when she developed substernal chest pressure around 1 pm while cleaning. She had radiation to her left arm and diaphoresis. No nausea, vomiting or SOB. She tried to rest for about 20 minutes but after pain persisted, she called EMS. She was given 4 baby aspirin as well as SL NTG which gave no relief. She received additional SL NTG and 4mg  of morphine in the ER which gave complete relief. EKG in the field showed anterior ST elevation with reciprocal T wave changes. Follow up EKG in the ER appeared similar. We were asked to consult after her troponin resulted high at 0.38. Upon reviewing the EKG, code STEMI protocol was activated. She denies any current chest pain. No LEE, orthopnea, fever, chills. She is no longer taking chronic steroids. No bleeding including BRBPR, hematemesis, melena, or hematuria. WBC 12.4, K3.6, glu 167. F/u tracing when pain free showed marked improvement in ST changes.  Past Medical History  Diagnosis Date  . Hypertension   . Anxiety   . Hypothyroidism   . GERD (gastroesophageal reflux disease)   . ADHD (attention deficit hyperactivity disorder)   . 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 GI bleed     2006: secondary to leukoclastic vasculitis  per chart  . Henoch-Schonlein purpura     HSP tx at [facility] in 2006 (also h/o leukocytoclastic vasculitis per chart 2006)  . Sleep apnea      Most Recent  Cardiac Studies: None   Surgical History: History reviewed. No pertinent past surgical history.   Home Meds: Prior to Admission medications   Medication Sig Start Date End Date Taking? Authorizing Provider  amphetamine-dextroamphetamine (ADDERALL XR) 20 MG 24 hr capsule Take 1 capsule (20 mg total) by mouth every morning. 11/01/12  Yes Margaree Mackintosh, MD  calcium-vitamin D (OSCAL WITH D) 500-200 MG-UNIT per tablet Take 1 tablet by mouth daily.   Yes Historical Provider, MD  desvenlafaxine (PRISTIQ) 50 MG 24 hr tablet Take 1 tablet (50 mg total) by mouth daily. 11/22/11  Yes Margaree Mackintosh, MD  enalapril (VASOTEC) 10 MG tablet Take 1 tablet (10 mg total) by mouth daily. 10/22/11  Yes Margaree Mackintosh, MD  HYDROcodone-acetaminophen (VICODIN) 5-500 MG per tablet Take 1 tablet by mouth every 4 (four) hours as needed. For pain   Yes Historical Provider, MD  levothyroxine (SYNTHROID, LEVOTHROID) 75 MCG tablet Take 1 tablet (75 mcg total) by mouth daily. 10/09/12  Yes Margaree Mackintosh, MD  omeprazole (PRILOSEC) 20 MG capsule Take 20 mg by mouth 2 (two) times daily.   Yes Historical Provider, MD  Pseudoeph-Doxylamine-DM-APAP (NYQUIL PO) Take 10 mLs by mouth at bedtime as needed. For cold   Yes Historical Provider, MD    Allergies:  Allergies  Allergen Reactions  . Levaquin (Levofloxacin) Hives  . Nsaids     Able to take aspirin    History   Social History  . Marital Status: Married    Spouse Name: N/A  Number of Children: N/A  . Years of Education: N/A   Occupational History  . RN/UC     [employer]   Social History Main Topics  . Smoking status: Current Every Day Smoker    Types: Cigarettes  . Smokeless tobacco: Never Used  . Alcohol Use: No  . Drug Use: No  . Sexually Active: Not on file   Other Topics Concern  . Not on file   Social History Narrative  . No narrative on file     Family History  Problem Relation Age of Onset  . Stroke Father   . Heart disease Neg Hx      Review of Systems: General: negative for chills, fever, night sweats Cardiovascular: see above. Has also had occasional palpitations Dermatological: negative for rash Respiratory: negative for cough or wheezing Urologic: negative for hematuria Abdominal: negative for nausea, vomiting, diarrhea, bright red blood per rectum, melena, or hematemesis Neurologic: negative for visual changes, syncope, or dizziness All other systems reviewed and are otherwise negative except as noted above.  Labs:   Lab Results  Component Value Date   WBC 12.4* 11/22/2012   HGB 14.8 11/22/2012   HCT 44.7 11/22/2012   MCV 95.7 11/22/2012   PLT 163 11/22/2012     Lab 11/22/12 1521  NA 139  K 3.4*  CL 102  CO2 26  BUN 10  CREATININE 0.70  CALCIUM 8.9  PROT --  BILITOT --  ALKPHOS --  ALT --  AST --  GLUCOSE 167*    Basename 11/22/12 1521  CKTOTAL --  CKMB --  TROPONINI 0.38*   Lab Results  Component Value Date   CHOL 197 03/24/2012   HDL 58 03/24/2012   LDLCALC 119* 03/24/2012   TRIG 100 03/24/2012   Radiology/Studies:  Dg Chest Port 1 View 11/22/2012  *RADIOLOGY REPORT*  Clinical Data: Smoker with current history of hypertension, presenting with chest pain.  PORTABLE CHEST - 1 VIEW 11/22/2012 1559 hours:  Comparison: Two-view chest x-ray 10/03/2005.  Findings: Cardiac silhouette normal and mediastinal contours unremarkable for the AP portable technique.  Lungs clear. Pulmonary vascularity normal.  Bronchovascular markings normal.  No pneumothorax.  No pleural effusions.  IMPRESSION: No acute cardiopulmonary disease.   Original Report Authenticated By: Hulan Saas, M.D.     EKG:  12/18 14:09 NSR 58bpm ST elevation in I, avL, V2-v3, anterior Q waves V2-V3, T wave changes inferolaterally deepest at 3mm in III 12/18 17:33 NSR 92bpm, anterior Q's V2-V3, no acute ST-T changes  Physical Exam: Blood pressure 130/84, pulse 63, temperature 97.8 F (36.6 C), temperature source Oral, resp.  rate 16, SpO2 100.00%. General: Well developed, well nourished WF in no acute distress, well appearing. Head: Normocephalic, atraumatic, sclera non-icteric, no xanthomas, nares are without discharge.  Neck: Negative for carotid bruits. JVD not elevated. Lungs: Faint occasional expiratory wheeze. No rhonchi or rales. Breathing is unlabored. Heart: RRR with S1 S2. No murmurs, rubs, or gallops appreciated. Abdomen: Soft, non-tender, non-distended with normoactive bowel sounds. No hepatomegaly. No rebound/guarding. No obvious abdominal masses. Msk:  Strength and tone appear normal for age. Extremities: No clubbing or cyanosis. No edema.  Distal pedal pulses are 2+ and equal bilaterally. Skin: No petechiae or purpura noted. Neuro: Alert and oriented X 3. No focal deficit. No facial asymmetry. Moves all extremities spontaneously. Psych:  Responds to questions appropriately with a normal affect.    ASSESSMENT AND PLAN:   1. Chest pain with aborted anterolateral MI, question vasospasm but  also component of CAD on cath 2. ADHD, on Adderall 3. Tobacco abuse 4. Sleep apnea 5. Remote history of Henoch-Schonlein purpura w/ GIB 2006, no recent history 6. Hypothyroidism 7. Hypokalemia - repletion ordered 8. Hyperglycemia  The patient was brought urgently to the cath lab. See below regarding Dr. Earmon Phoenix findings. Will admit, cycle cardiac enzymes, load with 300mg  of Plavix and place on 75mg  daily, and start amlodipine 5mg  daily (CCB chosen instead of longterm nitrate due to h/o migraines). Decrease enalapril to allow more pressure for new agent. Stop Adderall. Check A1c, lipids. Start statin and continue aspirin. Will keep on low dose NTG overnight. Check TSH.  Signed, Ronie Spies PA-C 11/22/2012, 6:14 PM

## 2012-11-22 NOTE — ED Notes (Signed)
Critical value of trop 0.38 given to EDP.  New orders obtained.

## 2012-11-23 DIAGNOSIS — E039 Hypothyroidism, unspecified: Secondary | ICD-10-CM

## 2012-11-23 DIAGNOSIS — I214 Non-ST elevation (NSTEMI) myocardial infarction: Secondary | ICD-10-CM

## 2012-11-23 DIAGNOSIS — I219 Acute myocardial infarction, unspecified: Secondary | ICD-10-CM

## 2012-11-23 LAB — CBC
HCT: 41.6 % (ref 36.0–46.0)
Hemoglobin: 13.5 g/dL (ref 12.0–15.0)
MCH: 31.2 pg (ref 26.0–34.0)
MCHC: 32.5 g/dL (ref 30.0–36.0)
MCV: 96.1 fL (ref 78.0–100.0)
Platelets: 148 10*3/uL — ABNORMAL LOW (ref 150–400)
RBC: 4.33 MIL/uL (ref 3.87–5.11)
RDW: 12.5 % (ref 11.5–15.5)
WBC: 10.1 10*3/uL (ref 4.0–10.5)

## 2012-11-23 LAB — TROPONIN I
Troponin I: >20 ng/mL (ref <0.30)
Troponin I: 9 ng/mL (ref <0.30)
Troponin I: 8.32 ng/mL (ref <0.30)
Troponin I: 3.74 ng/mL (ref <0.30)

## 2012-11-23 LAB — COMPREHENSIVE METABOLIC PANEL
ALT: 33 U/L (ref 0–35)
AST: 71 U/L — ABNORMAL HIGH (ref 0–37)
Albumin: 2.8 g/dL — ABNORMAL LOW (ref 3.5–5.2)
Alkaline Phosphatase: 64 U/L (ref 39–117)
BUN: 10 mg/dL (ref 6–23)
CO2: 27 mEq/L (ref 19–32)
Calcium: 8.6 mg/dL (ref 8.4–10.5)
Chloride: 107 mEq/L (ref 96–112)
Creatinine, Ser: 0.65 mg/dL (ref 0.50–1.10)
GFR calc Af Amer: >90 mL/min (ref >90)
GFR calc non Af Amer: >90 mL/min (ref >90)
Glucose, Bld: 105 mg/dL — ABNORMAL HIGH (ref 70–99)
Potassium: 4.2 mEq/L (ref 3.5–5.1)
Sodium: 140 mEq/L (ref 135–145)
Total Bilirubin: 0.4 mg/dL (ref 0.3–1.2)
Total Protein: 5.8 g/dL — ABNORMAL LOW (ref 6.0–8.3)

## 2012-11-23 LAB — HEMOGLOBIN A1C
Hgb A1c MFr Bld: 5.8 % — ABNORMAL HIGH (ref <5.7)
Mean Plasma Glucose: 120 mg/dL — ABNORMAL HIGH (ref <117)

## 2012-11-23 LAB — LIPID PANEL
Cholesterol: 162 mg/dL (ref 0–200)
HDL: 52 mg/dL (ref >39)
LDL Cholesterol: 88 mg/dL (ref 0–99)
Total CHOL/HDL Ratio: 3.1 RATIO
Triglycerides: 112 mg/dL (ref <150)
VLDL: 22 mg/dL (ref 0–40)

## 2012-11-23 LAB — TSH: TSH: 1.042 u[IU]/mL (ref 0.350–4.500)

## 2012-11-23 LAB — T4, FREE: Free T4: 1.32 ng/dL (ref 0.80–1.80)

## 2012-11-23 MED ORDER — MORPHINE SULFATE 2 MG/ML IJ SOLN
2.0000 mg | Freq: Once | INTRAMUSCULAR | Status: AC
  Administered 2012-11-23: 2 mg via INTRAVENOUS
  Filled 2012-11-23: qty 1

## 2012-11-23 NOTE — Progress Notes (Signed)
*  PRELIMINARY RESULTS* Echocardiogram 2D Echocardiogram has been performed.  [name] 11/23/2012, 9:58 AM

## 2012-11-23 NOTE — Progress Notes (Signed)
Dozing since morphine, awakens easily, denies complaints.

## 2012-11-23 NOTE — Progress Notes (Signed)
    Subjective:  No further chest pain or dyspnea. Had headache last night. No complaints this morning.  Objective:  Vital Signs in the last 24 hours: Temp:  [97.8 F (36.6 C)-98.4 F (36.9 C)] 98.1 F (36.7 C) (12/19 0440) Pulse Rate:  [55-89] 73  (12/19 0440) Resp:  [8-24] 18  (12/19 0500) BP: (94-130)/(50-84) 94/50 mmHg (12/19 0440) SpO2:  [93 %-100 %] 99 % (12/19 0440) Weight:  [86.9 kg (191 lb 9.3 oz)-88.9 kg (195 lb 15.8 oz)] 88.9 kg (195 lb 15.8 oz) (12/19 0440)  Intake/Output from previous day: 12/18 0701 - 12/19 0700 In: 967.7 [P.O.:660; I.V.:307.7] Out: -   Physical Exam: Pt is alert and oriented, NAD HEENT: normal Neck: JVP - normal, carotids 2+= without bruits Lungs: CTA bilaterally CV: RRR without murmur or gallop Abd: soft, NT, Positive BS, no hepatomegaly Ext: no C/C/E, distal pulses intact and equal, right radial site is clear Skin: warm/dry no rash   Lab Results:  Basename 11/23/12 0710 11/22/12 1521  WBC 10.1 12.4*  HGB 13.5 14.8  PLT 148* 163    Basename 11/23/12 0710 11/22/12 1521  NA 140 139  K 4.2 3.4*  CL 107 102  CO2 27 26  GLUCOSE 105* 167*  BUN 10 10  CREATININE 0.65 0.70    Basename 11/23/12 0036 11/22/12 1920  TROPONINI >20.00* >20.00*    Cardiac Studies: 2-D echo pending  EKG: Sinus rhythm with possible age-indeterminate anterior infarct  Tele: Sinus rhythm with a few PVCs  Assessment/Plan:  1. Acute myocardial infarction. Suspect related to coronary vasospasm. The patient's troponin is greater than 20 this morning. Will check a 2-D echocardiogram today. Considering the mechanism of this was likely related to vasospasm, I would favor continuing her on amlodipine. She is on dual antiplatelet therapy with aspirin and Plavix. She's also on atorvastatin. We discussed the critical importance of tobacco cessation and she understands. Will observe another 24 hours in the hospital. We'll discontinue IV nitroglycerin today.  2.  Hypertension. Will reduce the patient's ACE inhibitor to allow blood pressure room for amlodipine.  3. Hyperlipidemia. Lipids reviewed with an LDL of 88 and a cholesterol of 162. HDL is 52. She is on atorvastatin 40 mg.  Tonny Bollman, M.D. 11/23/2012, 8:34 AM

## 2012-11-23 NOTE — Progress Notes (Signed)
CARDIAC REHAB PHASE I   PRE:  Rate/Rhythm: 73 SR    BP: sitting 110/67    SaO2:   MODE:  Ambulation: 600 ft   POST:  Rate/Rhythm: 102 ST    BP: sitting 134/87     SaO2:   Tolerated well. Denied CP/SOB. Sts she feels normal. Pt tearful with realization of diagnosis. Sts "she is feeling sorry for herself". Gave resources re MI and smoking cessation. Sts she likes to smoke and then became tearful again. Will discuss more tomorrow when she has some time to absorb everything. Does st she would be interested in [facility] and will send referral to [place] [facility]. Can walk independently. [phone]  [name] CES, ACSM

## 2012-11-23 NOTE — Progress Notes (Signed)
Pt  C/o feeling hot and nauseated.  States headache getting worse, denies visual changes, no numbness or tingling.  Lights off, ice pack to head.  NTG gtt off.  Pupils PEARL 3mm. BP 123/71.  Medicated w/ iv zofran with moderate relief. Dr Charm Barges informed, orders received.   2 mg IV morphine given.  Call light in reach.

## 2012-11-23 NOTE — Progress Notes (Signed)
Chaplain responded to ED page that STEMI pt was going from ED to cath lab. I led pt's husband and three sons from ED to cath lab family room. 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 A former coworker who still works here accompanied [place] to family room and visited with pt's husband and family and with chaplain. I will follow up with family later as appropriate.

## 2012-11-23 NOTE — Progress Notes (Signed)
Utilization review completed.  P.J. Aladdin Kollmann,RN,BSN Case Manager [phone]  

## 2012-11-23 NOTE — Progress Notes (Signed)
Patient evaluated for long-term disease management services with the Link to Wellness program as a benefit of her [facility] insurance.Explained Link to Wellness program at bedside.Patient will receive a post discharge transition of care call upon discharge. Left Link to Wellness packet along with contact information.      Raiford Noble MSN-Ed, RN,BSN [facility] Liaison [phone]

## 2012-11-24 ENCOUNTER — Telehealth: Payer: Self-pay | Admitting: *Deleted

## 2012-11-24 ENCOUNTER — Encounter (HOSPITAL_COMMUNITY): Payer: Self-pay | Admitting: Nurse Practitioner

## 2012-11-24 DIAGNOSIS — I2510 Atherosclerotic heart disease of native coronary artery without angina pectoris: Secondary | ICD-10-CM

## 2012-11-24 DIAGNOSIS — Z720 Tobacco use: Secondary | ICD-10-CM

## 2012-11-24 DIAGNOSIS — I2109 ST elevation (STEMI) myocardial infarction involving other coronary artery of anterior wall: Secondary | ICD-10-CM

## 2012-11-24 DIAGNOSIS — I201 Angina pectoris with documented spasm: Secondary | ICD-10-CM

## 2012-11-24 LAB — BASIC METABOLIC PANEL
BUN: 10 mg/dL (ref 6–23)
CO2: 26 mEq/L (ref 19–32)
Calcium: 8.7 mg/dL (ref 8.4–10.5)
Chloride: 106 mEq/L (ref 96–112)
Creatinine, Ser: 0.66 mg/dL (ref 0.50–1.10)
GFR calc Af Amer: >90 mL/min (ref >90)
GFR calc non Af Amer: >90 mL/min (ref >90)
Glucose, Bld: 102 mg/dL — ABNORMAL HIGH (ref 70–99)
Potassium: 3.7 mEq/L (ref 3.5–5.1)
Sodium: 142 mEq/L (ref 135–145)

## 2012-11-24 MED ORDER — NITROGLYCERIN 0.4 MG SL SUBL: 0.4000 mg | SUBLINGUAL_TABLET | SUBLINGUAL | Status: DC | PRN

## 2012-11-24 MED ORDER — AMLODIPINE BESYLATE 5 MG PO TABS: 5.0000 mg | ORAL_TABLET | Freq: Every day | ORAL | Status: DC

## 2012-11-24 MED ORDER — PANTOPRAZOLE SODIUM 40 MG PO TBEC: 40.0000 mg | DELAYED_RELEASE_TABLET | Freq: Two times a day (BID) | ORAL | Status: DC

## 2012-11-24 MED ORDER — ATORVASTATIN CALCIUM 40 MG PO TABS: 40.0000 mg | ORAL_TABLET | Freq: Every day | ORAL | Status: DC

## 2012-11-24 MED ORDER — CLOPIDOGREL BISULFATE 75 MG PO TABS: 75.0000 mg | ORAL_TABLET | Freq: Every day | ORAL | Status: DC

## 2012-11-24 MED ORDER — ASPIRIN 81 MG PO TBEC: 81.0000 mg | DELAYED_RELEASE_TABLET | Freq: Every day | ORAL | Status: AC

## 2012-11-24 MED ORDER — ENALAPRIL MALEATE 5 MG PO TABS: 5.0000 mg | ORAL_TABLET | Freq: Every day | ORAL | Status: DC

## 2012-11-24 NOTE — Plan of Care (Signed)
Problem: Consults Goal: Tobacco Cessation referral if indicated Outcome: Completed/Met Date Met:  11/23/12 Discussed smoking cessation importance and tips for success.  Pt voices awareness of need to quit but does not appear committed or ready to quit.  Given 1-800 support number.

## 2012-11-24 NOTE — Telephone Encounter (Signed)
Pt still in hospital/ call Monday 11/27/12

## 2012-11-24 NOTE — Progress Notes (Signed)
CARDIAC REHAB PHASE I   PRE:  Rate/Rhythm: 79SR  BP:  Supine: 132/73  Sitting:  Standing:    SaO2:   MODE:  Ambulation: 1000 ft   POST:  Rate/Rhythem: 106ST  BP:  Supine:   Sitting: 134/78  Standing:    SaO2:  0755-0840 Pt walked 1000 ft on RA with steady gait. Denied CP. Tolerated well. Education completed. Pt tearful at end of ed. Tried to give emotional support. Encouraged EAP if pt would like to talk to someone about what she is dealing with. We are referring to [facility] Phase 2. Encouraged smoking cessation. Pt states may try the gum to help. Knows she can call 1800quitnow if needed.  [name]

## 2012-11-24 NOTE — Telephone Encounter (Signed)
Message copied by Antony Odea on Fri Nov 24, 2012  1:16 PM ------      Message from: Paulene Floor      Created: Fri Nov 24, 2012 12:58 PM      Regarding: Transion of Care Appointment       I have scheduled the patient to be seen by Lawson Fiscal on 12/05/12 per Thayer Ohm PA.

## 2012-11-24 NOTE — Progress Notes (Signed)
    Subjective:  No chest pain or dyspnea. Still coming to terms with the fact she had an MI.  Objective:  Vital Signs in the last 24 hours: Temp:  [97.8 F (36.6 C)-98.8 F (37.1 C)] 98.3 F (36.8 C) (12/20 1129) Pulse Rate:  [66-85] 66  (12/20 1129) Resp:  [15-21] 15  (12/20 1129) BP: (114-139)/(46-83) 114/73 mmHg (12/20 1129) SpO2:  [95 %-99 %] 95 % (12/20 1129) Weight:  [90.9 kg (200 lb 6.4 oz)] 90.9 kg (200 lb 6.4 oz) (12/20 0030)  Intake/Output from previous day: 12/19 0701 - 12/20 0700 In: 480 [P.O.:480] Out: -   Physical Exam: Pt is alert and oriented, NAD HEENT: normal Neck: JVP - normal, carotids 2+= without bruits Lungs: CTA bilaterally CV: RRR without murmur or gallop Abd: soft, NT, Positive BS, no hepatomegaly Ext: no C/C/E, distal pulses intact and equal Skin: warm/dry no rash   Lab Results:  Basename 11/23/12 0710 11/22/12 1521  WBC 10.1 12.4*  HGB 13.5 14.8  PLT 148* 163    Basename 11/24/12 0605 11/23/12 0710  NA 142 140  K 3.7 4.2  CL 106 107  CO2 26 27  GLUCOSE 102* 105*  BUN 10 10  CREATININE 0.66 0.65    Basename 11/23/12 1830 11/23/12 1242  TROPONINI 3.74* 8.32*    Cardiac Studies: 2D Echo: Left ventricle: The cavity size was normal. Wall thickness was normal. Systolic function was normal. The estimated ejection fraction was in the range of 60% to 65%. Wall motion was normal; there were no regional wall motion abnormalities. Features are consistent with a pseudonormal left ventricular filling pattern, with concomitant abnormal relaxation and increased filling pressure (grade 2 diastolic dysfunction).  ------------------------------------------------------------ Aortic valve: Trileaflet; normal thickness leaflets. Mobility was not restricted. Doppler: Transvalvular velocity was within the normal range. There was no stenosis. No regurgitation.  ------------------------------------------------------------ Aorta: Aortic root:  The aortic root was normal in size.  ------------------------------------------------------------ Mitral valve: Structurally normal valve. Mobility was not restricted. Doppler: Transvalvular velocity was within the normal range. There was no evidence for stenosis. No regurgitation. Peak gradient: 3mm Hg (D).  ------------------------------------------------------------ Left atrium: The atrium was normal in size.  ------------------------------------------------------------ Right ventricle: The cavity size was normal. Systolic function was normal.  ------------------------------------------------------------ Pulmonic valve: Doppler: Transvalvular velocity was within the normal range. There was no evidence for stenosis.  ------------------------------------------------------------ Tricuspid valve: Structurally normal valve. Doppler: Transvalvular velocity was within the normal range. Trivial regurgitation.  ------------------------------------------------------------ Right atrium: The atrium was normal in size.  ------------------------------------------------------------ Pericardium: There was no pericardial effusion.  ------------------------------------------------------------ Systemic veins: Inferior vena cava: The vessel was normal in size.  Assessment/Plan:  1. Acute MI - likely secondary to coronary vasospastic event 2. HTN 3. Hyperlipidemia  Continue current med program which was reviewed today. Smoking cessation encouraged - she understands this is most important way to prevent recurrent event. ASA/plavix 12 months, no beta-blocker secondary to vasospasm mechanism (amlodipine). Off work 2 weeks (return after first of year). Follow-up with physician extender in the office within 2 weeks.  Tonny Bollman, M.D. 11/24/2012, 12:34 PM

## 2012-11-24 NOTE — Discharge Summary (Signed)
Patient ID: [name],  MRN: [id number], DOB/AGE: 1966-06-18 46 y.o.  Admit date: 11/22/2012 Discharge date: 11/24/2012  Primary Care Provider: Margaree Mackintosh Primary Cardiologist: Judie Petit. Excell Seltzer, MD  Discharge Diagnoses Principal Problem:  *Acute MI anterior wall first episode care  **Cath this admission revealing a subtotal occlusion of 1mm second diagonal branch.  Coronary Vasospasm presumed to have played a role.  Active Problems:  CAD (coronary artery disease)  Coronary vasospasm  GE reflux  Hypertension  Tobacco abuse  Anxiety and depression  Attention deficit disorder  Hypothyroidism  History of migraine headaches  Allergies Allergies  Allergen Reactions  . Levaquin (Levofloxacin) Hives  . Nsaids Other (See Comments)    Able to take aspirin "Had HSP; since then they treat my kidneys very nicely" (11/22/2012)   Procedures  Cardiac Catheterization 11/22/2012  Procedural Findings: Hemodynamics: AO 119/79 LV 119/23  Coronary angiography: Coronary dominance: right  Left mainstem: Widely patent with no obstructive disease the Left anterior descending (LAD): The LAD is widely patent throughout its course. There is minor luminal irregularity but no significant stenoses. The second diagonal branch has a subbranch demonstrating subtotal occlusion with high-grade stenosis. This is less than a 1 mm vessel. There is no other obstructive disease visualized throughout the course of the LAD distribution. Left circumflex (LCx): The left circumflex is large caliber and it is widely patent throughout. The obtuse marginal branches are patent. Right coronary artery (RCA): The RCA is a large, dominant vessel. There is no significant stenosis throughout the proximal, mid, and distal portions. The PDA branch has mild nonobstructive plaque. The PLA branch is patent without significant stenosis. Left ventriculography: Left ventricular systolic function is normal, LVEF is  estimated at 55-65%, there is no significant mitral regurgitation   Final Conclusions:   1. Severe stenosis of the small diagonal subbranch, not amenable to PCI. 2. Minor nonobstructive LAD and right coronary artery stenoses 3. Normal left ventricular systolic function  Recommendations: Suspect this event was related to either her small diagonal occlusion or a more significant coronary vasospastic event. The extent of injury current on her initial ECG would suggest the latter. Smoking cessation will be imperative. We will start her on dual antiplatelet therapy with aspirin and Plavix. She should also be started on a calcium channel blocker. Will not initiate nitrates at this point considering her history of severe migraine headaches. _____________  2D Echocardiogram 11/23/2012  Study Conclusions  Left ventricle: The cavity size was normal. Wall thickness was normal. Systolic function was normal. The estimated ejection fraction was in the range of 60% to 65%. Wall motion was normal; there were no regional wall motion abnormalities. Features are consistent with a pseudonormal left ventricular filling pattern, with concomitant abnormal relaxation and increased filling pressure (grade 2 diastolic dysfunction).           _____________  History of Present Illness  46 y/o female without prior cardiac history who was in her USOH until the day of admisstion when she developed severe substernal chest pressure radiating down her left arm, associated with diaphoresis, while cleaning around her home.  After 20 minutes of persistent symptoms, she called EMS.  Following EMS arrival she was treated with 4 baby aspirin and sublingual nitroglycerin without relief.  She was taken to the [facility] ED where she received additional ntg and morphine with eventual relief.  Her troponin returned elevated at 0.38, at which point cardiology was consulted.  Following review of ECG's from the field by cardiology, it was  noted that patient had significant anterior ST elevation with reciprocal T wave changes.  A Code STEMI was activated and pt was taken to the lab for emergent catheterization.  Follow-up ECG, in the setting of pain relief showed significant improvement in ST/T changes.  Hospital Course  Pt was taken to the cath lab for emergent diagnostic catheterization which showed a sub-total occlusion of a small, 1 mm, second diagonal branch.  She otherwise had non-obstructive CAD and normal LV function.  It was felt that coronary vasospasm likely played a significant role in her presentation, given marked ECG abnormalities upon presentation.  She was monitored in the cardiac stepdown unit and placed on calcium channel blocker therapy.  We avoided long-acting nitrates secondary to h/o migraines.  She eventually peaked her Troponin I @ >20.00.  2D echocardiogram was carried out on 12/19 and showed normal LV function with grade II diastolic dysfunction.  She was seen by cardiac rehab and was able to ambulate without recurrence of chest pain.  She was also counseled on the importance of smoking cessation.  She will be discharged home today in good condition and will remain on dual-antiplatelet therapy for 12 months.  Of note, pt has previously been treated for ADD with adderal therapy.  In light of acute MI this has been held.  We have advised that she f/u with her PCP for consideration of an alternate agent.  Discharge Vitals Blood pressure 114/73, pulse 66, temperature 98.3 F (36.8 C), temperature source Oral, resp. rate 15, height 5\' 5"  (1.651 m), weight 200 lb 6.4 oz (90.9 kg), SpO2 95.00%.  Filed Weights   11/22/12 1902 11/23/12 0440 11/24/12 0030  Weight: 191 lb 9.3 oz (86.9 kg) 195 lb 15.8 oz (88.9 kg) 200 lb 6.4 oz (90.9 kg)   Labs  CBC  Basename 11/23/12 0710 11/22/12 1521  WBC 10.1 12.4*  NEUTROABS -- 10.5*  HGB 13.5 14.8  HCT 41.6 44.7  MCV 96.1 95.7  PLT 148* 163   Basic Metabolic  Panel  Basename 11/24/12 0605 11/23/12 0710  NA 142 140  K 3.7 4.2  CL 106 107  CO2 26 27  GLUCOSE 102* 105*  BUN 10 10  CREATININE 0.66 0.65  CALCIUM 8.7 8.6  MG -- --  PHOS -- --   Liver Function Tests  Basename 11/23/12 0710  AST 71*  ALT 33  ALKPHOS 64  BILITOT 0.4  PROT 5.8*  ALBUMIN 2.8*   Cardiac Enzymes  Basename 11/23/12 1830 11/23/12 1242 11/23/12 0817  CKTOTAL -- -- --  CKMB -- -- --  CKMBINDEX -- -- --  TROPONINI 3.74* 8.32* 9.00*   Hemoglobin A1C  Basename 11/22/12 1920  HGBA1C 5.8*   Fasting Lipid Panel  Basename 11/23/12 0710  CHOL 162  HDL 52  LDLCALC 88  TRIG 112  CHOLHDL 3.1  LDLDIRECT --   Thyroid Function Tests  Basename 11/22/12 1920  TSH 1.042  T4TOTAL --  T3FREE --  THYROIDAB --  Free T4         1.32  Disposition  Pt is being discharged home today in good condition.  Follow-up Plans & Appointments      Follow-up Information    Follow up with Norma Fredrickson, NP. On 12/05/2012. (11:30 AM Dr. Earmon Phoenix NP)    Contact information:   [address] [place] [address] [phone]       Follow up with Margaree Mackintosh, MD. (2-3 wks)    Contact information:  [address] [place] [address] [phone]         Discharge Medications    Medication List     As of 11/24/2012  1:05 PM    STOP taking these medications         amphetamine-dextroamphetamine 20 MG 24 hr capsule   Commonly known as: ADDERALL XR      NYQUIL PO      omeprazole 20 MG capsule   Commonly known as: PRILOSEC   Replaced by: pantoprazole 40 MG tablet      TAKE these medications         amLODipine 5 MG tablet   Commonly known as: NORVASC   Take 1 tablet (5 mg total) by mouth daily.      aspirin 81 MG EC tablet   Take 1 tablet (81 mg total) by mouth daily.      atorvastatin 40 MG tablet   Commonly known as: LIPITOR   Take 1 tablet (40 mg total) by mouth daily at 6 PM.      calcium-vitamin D 500-200  MG-UNIT per tablet   Commonly known as: OSCAL WITH D   Take 1 tablet by mouth daily.      clopidogrel 75 MG tablet   Commonly known as: PLAVIX   Take 1 tablet (75 mg total) by mouth daily with breakfast.      desvenlafaxine 50 MG 24 hr tablet   Commonly known as: PRISTIQ   Take 1 tablet (50 mg total) by mouth daily.      enalapril 5 MG tablet   Commonly known as: VASOTEC   Take 1 tablet (5 mg total) by mouth daily.      HYDROcodone-acetaminophen 5-500 MG per tablet   Commonly known as: VICODIN   Take 1 tablet by mouth every 4 (four) hours as needed. For pain      levothyroxine 75 MCG tablet   Commonly known as: SYNTHROID, LEVOTHROID   Take 1 tablet (75 mcg total) by mouth daily.      nitroGLYCERIN 0.4 MG SL tablet   Commonly known as: NITROSTAT   Place 1 tablet (0.4 mg total) under the tongue every 5 (five) minutes as needed for chest pain.      pantoprazole 40 MG tablet   Commonly known as: PROTONIX   Take 1 tablet (40 mg total) by mouth 2 (two) times daily before a meal.       Outstanding Labs/Studies  F/U lipids/lft's in 8 weeks.  Duration of Discharge Encounter   Greater than 30 minutes including physician time.  Signed, Nicolasa Ducking NP 11/24/2012, 1:05 PM

## 2012-11-27 NOTE — Telephone Encounter (Signed)
Called patient with appointment information. She has all medications and does not have any questions at this time.

## 2012-12-05 ENCOUNTER — Encounter: Payer: Self-pay | Admitting: Nurse Practitioner

## 2012-12-05 ENCOUNTER — Ambulatory Visit (INDEPENDENT_AMBULATORY_CARE_PROVIDER_SITE_OTHER): Payer: 59 | Admitting: Nurse Practitioner

## 2012-12-05 VITALS — BP 120/84 | HR 68 | Ht 65.0 in | Wt 190.8 lb

## 2012-12-05 DIAGNOSIS — I213 ST elevation (STEMI) myocardial infarction of unspecified site: Secondary | ICD-10-CM

## 2012-12-05 LAB — CBC WITH DIFFERENTIAL/PLATELET
Basophils Absolute: 0 10*3/uL (ref 0.0–0.1)
Basophils Relative: 0.3 % (ref 0.0–3.0)
Eosinophils Absolute: 0.2 10*3/uL (ref 0.0–0.7)
Eosinophils Relative: 1.9 % (ref 0.0–5.0)
HCT: 45.7 % (ref 36.0–46.0)
Hemoglobin: 15.5 g/dL — ABNORMAL HIGH (ref 12.0–15.0)
Lymphocytes Relative: 28.2 % (ref 12.0–46.0)
Lymphs Abs: 2.8 10*3/uL (ref 0.7–4.0)
MCHC: 34 g/dL (ref 30.0–36.0)
MCV: 94.8 fl (ref 78.0–100.0)
Monocytes Absolute: 0.6 10*3/uL (ref 0.1–1.0)
Monocytes Relative: 5.8 % (ref 3.0–12.0)
Neutro Abs: 6.2 10*3/uL (ref 1.4–7.7)
Neutrophils Relative %: 63.8 % (ref 43.0–77.0)
Platelets: 241 10*3/uL (ref 150.0–400.0)
RBC: 4.82 Mil/uL (ref 3.87–5.11)
RDW: 12.7 % (ref 11.5–14.6)
WBC: 9.8 10*3/uL (ref 4.5–10.5)

## 2012-12-05 LAB — BASIC METABOLIC PANEL
BUN: 12 mg/dL (ref 6–23)
CO2: 30 mEq/L (ref 19–32)
Calcium: 9 mg/dL (ref 8.4–10.5)
Chloride: 101 mEq/L (ref 96–112)
Creatinine, Ser: 0.8 mg/dL (ref 0.4–1.2)
GFR: 78.12 mL/min (ref >60.00)
Glucose, Bld: 84 mg/dL (ref 70–99)
Potassium: 4.5 mEq/L (ref 3.5–5.1)
Sodium: 139 mEq/L (ref 135–145)

## 2012-12-05 NOTE — Progress Notes (Signed)
Ranae Plumber Lam Date of Birth: 1966-02-09 Medical Record #[id number]  History of Present Illness: [name] is seen back today for a post hospital visit. It is a transition of care visit. She is seen for Dr. Excell Seltzer. She has had recent acute anterior MI with cath showing a subtotal occlusion of a 1 mm second diagonal branch - not amenable to PCI - managed medically. Coronary vasospasm was presumed to have played a role in this event. Her other issues are as noted below and include tobacco abuse, HLD, GERD, HTN, anxiety and depressin, ADD, hypothyroidism, & migraines.   She comes in today. She is here alone. She is doing ok. She has not had any more chest pain. She admits that she is a little paranoid about her situation. Anxious to start rehab but has not heard from them yet. She has cut back with her smoking drastically and understands that she needs to quit altogether. Tolerating her medicines. Asking about what cold meds to use. Now off of her Adderal.   Current Outpatient Prescriptions on File Prior to Visit  Medication Sig Dispense Refill  . amLODipine (NORVASC) 5 MG tablet Take 1 tablet (5 mg total) by mouth daily.  30 tablet  6  . aspirin EC 81 MG EC tablet Take 1 tablet (81 mg total) by mouth daily.      Marland Kitchen atorvastatin (LIPITOR) 40 MG tablet Take 1 tablet (40 mg total) by mouth daily at 6 PM.  30 tablet  6  . calcium-vitamin D (OSCAL WITH D) 500-200 MG-UNIT per tablet Take 1 tablet by mouth daily.      . clopidogrel (PLAVIX) 75 MG tablet Take 1 tablet (75 mg total) by mouth daily with breakfast.  30 tablet  6  . desvenlafaxine (PRISTIQ) 50 MG 24 hr tablet Take 1 tablet (50 mg total) by mouth daily.  90 tablet  3  . enalapril (VASOTEC) 5 MG tablet Take 1 tablet (5 mg total) by mouth daily.  30 tablet  6  . HYDROcodone-acetaminophen (VICODIN) 5-500 MG per tablet Take 1 tablet by mouth every 4 (four) hours as needed. For pain      . levothyroxine (SYNTHROID, LEVOTHROID) 75 MCG  tablet Take 1 tablet (75 mcg total) by mouth daily.  90 tablet  3  . nitroGLYCERIN (NITROSTAT) 0.4 MG SL tablet Place 1 tablet (0.4 mg total) under the tongue every 5 (five) minutes as needed for chest pain.  25 tablet  3  . pantoprazole (PROTONIX) 40 MG tablet Take 1 tablet (40 mg total) by mouth 2 (two) times daily before a meal.  60 tablet  6   Current Facility-Administered Medications on File Prior to Visit  Medication Dose Route Frequency Provider Last Rate Last Dose  . methylPREDNISolone acetate (DEPO-MEDROL) injection 80 mg  80 mg Intramuscular Once Margaree Mackintosh, MD        Allergies  Allergen Reactions  . Levaquin (Levofloxacin) Hives  . Nsaids Other (See Comments)    Able to take aspirin "Had HSP; since then they treat my kidneys very nicely" (11/22/2012)    Past Medical History  Diagnosis Date  . Hypertension     Controlled - she states she is on BP meds for HSP  . Anxiety   . Hypothyroidism   . GERD (gastroesophageal reflux disease)   . ADHD (attention deficit hyperactivity disorder)   . 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 GI bleed  2006: secondary to leukoclastic vasculitis  per chart  . Henoch-Schonlein purpura     HSP tx at [facility] in 2006 (also h/o leukocytoclastic vasculitis per chart 2006)  . Asthma   . Sleep apnea     "lost 50#; no problems w/it now" (11/22/2012)  . Migraines   . Depression   . Chronic kidney disease     in setting of HSP 2006  . CAD (coronary artery disease)     a. 11/2012 Acute Anterior STEMI/Cath: Nonobs dzs except subtotal occlusion of small, 1mm D2, EF 55-65%;  b. 11/2012  Echo: EF 60-65%, nl wall motion, Gr 2 DD.  . Tobacco abuse   . Coronary vasospasm     a. presumed    Past Surgical History  Procedure Date  . Appendectomy 1970's  . Cesarean section 1995; 1996; 1999  . Dilation and curettage of uterus 1990's  . Cardiac catheterization 11/22/2012    "first  one" (11/22/2012)    History  Smoking status  . Current Every Day Smoker -- 0.5 packs/day for 25 years  . Types: Cigarettes  Smokeless tobacco  . Never Used    History  Alcohol Use No    Family History  Problem Relation Age of Onset  . Stroke Father   . Heart disease Neg Hx   . Hypertension Mother     Review of Systems: The review of systems is per the HPI.  All other systems were reviewed and are negative.  Physical Exam: BP 120/84  Pulse 68  Ht 5\' 5"  (1.651 m)  Wt 190 lb 12.8 oz (86.546 kg)  BMI 31.75 kg/m2 Patient is very pleasant and in no acute distress. Skin is warm and dry. Color is normal.  HEENT is unremarkable. Normocephalic/atraumatic. PERRL. Sclera are nonicteric. Neck is supple. No masses. No JVD. Lungs are clear. Cardiac exam shows a regular rate and rhythm. Abdomen is soft. Extremities are without edema. Gait and ROM are intact. No gross neurologic deficits noted.   LABORATORY DATA: Pending  Lab Results  Component Value Date   WBC 10.1 11/23/2012   HGB 13.5 11/23/2012   HCT 41.6 11/23/2012   PLT 148* 11/23/2012   GLUCOSE 102* 11/24/2012   CHOL 162 11/23/2012   TRIG 112 11/23/2012   HDL 52 11/23/2012   LDLCALC 88 11/23/2012   ALT 33 11/23/2012   AST 71* 11/23/2012   NA 142 11/24/2012   K 3.7 11/24/2012   CL 106 11/24/2012   CREATININE 0.66 11/24/2012   BUN 10 11/24/2012   CO2 26 11/24/2012   TSH 1.042 11/22/2012   HGBA1C 5.8* 11/22/2012   Echo Study Conclusions  Left ventricle: The cavity size was normal. Wall thickness was normal. Systolic function was normal. The estimated ejection fraction was in the range of 60% to 65%. Wall motion was normal; there were no regional wall motion abnormalities. Features are consistent with a pseudonormal left ventricular filling pattern, with concomitant abnormal relaxation and increased filling pressure (grade 2 diastolic dysfunction).    Cardiac Final Conclusions:   1. Severe stenosis of the  small diagonal subbranch, not amenable to PCI.  2. Minor nonobstructive LAD and right coronary artery stenoses  3. Normal left ventricular systolic function   Recommendations: Suspect this event was related to either her small diagonal occlusion or a more significant coronary vasospastic event. The extent of injury current on her initial ECG would suggest the latter. Smoking cessation will be imperative. We will start her on dual antiplatelet therapy with aspirin  and Plavix. She should also be started on a calcium channel blocker. Will not initiate nitrates at this point considering her history of severe migraine headaches.   Tonny Bollman  11/22/2012, 6:44 PM  Assessment / Plan:  1. Anterior MI - managed medically - felt to have a component of vasospasm - currently doing ok and without symptoms. Will continue with her current regimen. Rechecking labs today. Ok to start rehab and I have sent them a note in Epic. See Dr. Excell Seltzer back in a month with fasting labs. To return for only 4hr shifts after January 5th for 2 weeks and then back to her normal schedule.   2. Tobacco abuse - total cessation is encouraged.   3. HLD -  On statin therapy  Patient is agreeable to this plan and will call if any problems develop in the interim.

## 2012-12-05 NOTE — Patient Instructions (Addendum)
Safe cold medicines are plain antihistamines and you can use the Coricidin HBP products  No decongestants.  We need to check labs today  Keep working on your smoking cessation  See Dr. Excell Seltzer in a month with fasting labs  May return to work 4 hour shifts after January 5th for 2 weeks and then normal hours  Walking every day - start 10 minutes two times a day and add a minute a day - goal is 45 to 60 minutes per day  Call the [facility] office at [phone] if you have any questions, problems or concerns.

## 2012-12-08 ENCOUNTER — Encounter: Payer: Self-pay | Admitting: Internal Medicine

## 2012-12-08 ENCOUNTER — Telehealth: Payer: Self-pay | Admitting: Cardiovascular Disease

## 2012-12-08 NOTE — Patient Instructions (Addendum)
Depo-Medrol 80 mg IM given in office today

## 2012-12-08 NOTE — Progress Notes (Signed)
  Subjective:    Patient ID: [name], female    DOB: Nov 05, 1966, 47 y.o.   MRN: [id number]  HPI Patient stopped by without an appointment to leave FMLA forms for completion. Also stated that she had had recurrent respiratory congestion. Has had fatigue and malaise.    Review of Systems     Objective:   Physical Exam HEENT exam: TMs and pharynx are clear. Neck is supple without thyromegaly or adenopathy. Chest clear to auscultation.        Assessment & Plan:  URI  Plan: Depo-Medrol 80 mg IM for congestion.

## 2012-12-08 NOTE — Telephone Encounter (Signed)
Pt has a rtn to work note for 12-10-12 , would like to change it to 12-12-12, pls call  [phone]

## 2012-12-08 NOTE — Telephone Encounter (Signed)
I spoke with the pt and we will change the pt's return to work date to 12/12/12 with the same restrictions.  The pt would like to pick up a new letter from the office.  The letter will be placed at the front desk.

## 2012-12-14 ENCOUNTER — Encounter (HOSPITAL_COMMUNITY)
Admission: RE | Admit: 2012-12-14 | Discharge: 2012-12-14 | Disposition: A | Discharge status: Home / Self Care | Payer: 59 | Source: Ambulatory Visit | Attending: Cardiovascular Disease | Admitting: Cardiovascular Disease

## 2012-12-14 DIAGNOSIS — Z5189 Encounter for other specified aftercare: Secondary | ICD-10-CM | POA: Insufficient documentation

## 2012-12-14 DIAGNOSIS — I2109 ST elevation (STEMI) myocardial infarction involving other coronary artery of anterior wall: Secondary | ICD-10-CM | POA: Insufficient documentation

## 2012-12-14 NOTE — Progress Notes (Signed)
Cardiac Rehab Medication Review by a Pharmacist  Does the patient  feel that his/her medications are working for him/her?  yes  Has the patient been experiencing any side effects to the medications prescribed?  no  Does the patient measure his/her own blood pressure or blood glucose at home?  no   Does the patient have any problems obtaining medications due to transportation or finances?   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 She understands her medications and is doing well.    [name] 12/14/2012 9:14 AM

## 2012-12-18 ENCOUNTER — Emergency Department (HOSPITAL_COMMUNITY)
Admission: EM | Admit: 2012-12-18 | Discharge: 2012-12-19 | Disposition: A | Discharge status: Home / Self Care | Payer: 59 | Attending: Emergency Medicine | Admitting: Emergency Medicine

## 2012-12-18 ENCOUNTER — Encounter (HOSPITAL_COMMUNITY): Payer: 59

## 2012-12-18 ENCOUNTER — Other Ambulatory Visit: Payer: Self-pay

## 2012-12-18 ENCOUNTER — Encounter (HOSPITAL_COMMUNITY): Payer: Self-pay

## 2012-12-18 DIAGNOSIS — Z8719 Personal history of other diseases of the digestive system: Secondary | ICD-10-CM | POA: Insufficient documentation

## 2012-12-18 DIAGNOSIS — R5383 Other fatigue: Secondary | ICD-10-CM | POA: Insufficient documentation

## 2012-12-18 DIAGNOSIS — R5381 Other malaise: Secondary | ICD-10-CM | POA: Insufficient documentation

## 2012-12-18 DIAGNOSIS — E039 Hypothyroidism, unspecified: Secondary | ICD-10-CM | POA: Insufficient documentation

## 2012-12-18 DIAGNOSIS — N189 Chronic kidney disease, unspecified: Secondary | ICD-10-CM | POA: Insufficient documentation

## 2012-12-18 DIAGNOSIS — R110 Nausea: Secondary | ICD-10-CM | POA: Insufficient documentation

## 2012-12-18 DIAGNOSIS — R6883 Chills (without fever): Secondary | ICD-10-CM | POA: Insufficient documentation

## 2012-12-18 DIAGNOSIS — J45909 Unspecified asthma, uncomplicated: Secondary | ICD-10-CM | POA: Insufficient documentation

## 2012-12-18 DIAGNOSIS — Z7982 Long term (current) use of aspirin: Secondary | ICD-10-CM | POA: Insufficient documentation

## 2012-12-18 DIAGNOSIS — F3289 Other specified depressive episodes: Secondary | ICD-10-CM | POA: Insufficient documentation

## 2012-12-18 DIAGNOSIS — F909 Attention-deficit hyperactivity disorder, unspecified type: Secondary | ICD-10-CM | POA: Insufficient documentation

## 2012-12-18 DIAGNOSIS — F329 Major depressive disorder, single episode, unspecified: Secondary | ICD-10-CM | POA: Insufficient documentation

## 2012-12-18 DIAGNOSIS — G4730 Sleep apnea, unspecified: Secondary | ICD-10-CM | POA: Insufficient documentation

## 2012-12-18 DIAGNOSIS — IMO0001 Reserved for inherently not codable concepts without codable children: Secondary | ICD-10-CM | POA: Insufficient documentation

## 2012-12-18 DIAGNOSIS — I129 Hypertensive chronic kidney disease with stage 1 through stage 4 chronic kidney disease, or unspecified chronic kidney disease: Secondary | ICD-10-CM | POA: Insufficient documentation

## 2012-12-18 DIAGNOSIS — B349 Viral infection, unspecified: Secondary | ICD-10-CM

## 2012-12-18 DIAGNOSIS — Z8679 Personal history of other diseases of the circulatory system: Secondary | ICD-10-CM | POA: Insufficient documentation

## 2012-12-18 DIAGNOSIS — D690 Allergic purpura: Secondary | ICD-10-CM | POA: Insufficient documentation

## 2012-12-18 DIAGNOSIS — I2510 Atherosclerotic heart disease of native coronary artery without angina pectoris: Secondary | ICD-10-CM | POA: Insufficient documentation

## 2012-12-18 DIAGNOSIS — B9789 Other viral agents as the cause of diseases classified elsewhere: Secondary | ICD-10-CM | POA: Insufficient documentation

## 2012-12-18 DIAGNOSIS — F411 Generalized anxiety disorder: Secondary | ICD-10-CM | POA: Insufficient documentation

## 2012-12-18 DIAGNOSIS — R51 Headache: Secondary | ICD-10-CM | POA: Insufficient documentation

## 2012-12-18 DIAGNOSIS — F17200 Nicotine dependence, unspecified, uncomplicated: Secondary | ICD-10-CM | POA: Insufficient documentation

## 2012-12-18 DIAGNOSIS — R1013 Epigastric pain: Secondary | ICD-10-CM | POA: Insufficient documentation

## 2012-12-18 DIAGNOSIS — Z9089 Acquired absence of other organs: Secondary | ICD-10-CM | POA: Insufficient documentation

## 2012-12-18 DIAGNOSIS — Z9889 Other specified postprocedural states: Secondary | ICD-10-CM | POA: Insufficient documentation

## 2012-12-18 DIAGNOSIS — G43909 Migraine, unspecified, not intractable, without status migrainosus: Secondary | ICD-10-CM | POA: Insufficient documentation

## 2012-12-18 DIAGNOSIS — K219 Gastro-esophageal reflux disease without esophagitis: Secondary | ICD-10-CM | POA: Insufficient documentation

## 2012-12-18 LAB — CBC WITH DIFFERENTIAL/PLATELET
Basophils Absolute: 0 10*3/uL (ref 0.0–0.1)
Basophils Relative: 0 % (ref 0–1)
Eosinophils Absolute: 0 10*3/uL (ref 0.0–0.7)
Eosinophils Relative: 0 % (ref 0–5)
HCT: 46.7 % — ABNORMAL HIGH (ref 36.0–46.0)
Hemoglobin: 16 g/dL — ABNORMAL HIGH (ref 12.0–15.0)
Lymphocytes Relative: 6 % — ABNORMAL LOW (ref 12–46)
Lymphs Abs: 0.5 10*3/uL — ABNORMAL LOW (ref 0.7–4.0)
MCH: 32.1 pg (ref 26.0–34.0)
MCHC: 34.3 g/dL (ref 30.0–36.0)
MCV: 93.8 fL (ref 78.0–100.0)
Monocytes Absolute: 0.4 10*3/uL (ref 0.1–1.0)
Monocytes Relative: 4 % (ref 3–12)
Neutro Abs: 8.2 10*3/uL — ABNORMAL HIGH (ref 1.7–7.7)
Neutrophils Relative %: 90 % — ABNORMAL HIGH (ref 43–77)
Platelets: 140 10*3/uL — ABNORMAL LOW (ref 150–400)
RBC: 4.98 MIL/uL (ref 3.87–5.11)
RDW: 12.3 % (ref 11.5–15.5)
WBC: 9.1 10*3/uL (ref 4.0–10.5)

## 2012-12-18 LAB — COMPREHENSIVE METABOLIC PANEL
ALT: 26 U/L (ref 0–35)
AST: 27 U/L (ref 0–37)
Albumin: 3.7 g/dL (ref 3.5–5.2)
Alkaline Phosphatase: 68 U/L (ref 39–117)
BUN: 18 mg/dL (ref 6–23)
CO2: 26 mEq/L (ref 19–32)
Calcium: 9.2 mg/dL (ref 8.4–10.5)
Chloride: 96 mEq/L (ref 96–112)
Creatinine, Ser: 0.71 mg/dL (ref 0.50–1.10)
GFR calc Af Amer: >90 mL/min (ref >90)
GFR calc non Af Amer: >90 mL/min (ref >90)
Glucose, Bld: 108 mg/dL — ABNORMAL HIGH (ref 70–99)
Potassium: 3.2 mEq/L — ABNORMAL LOW (ref 3.5–5.1)
Sodium: 135 mEq/L (ref 135–145)
Total Bilirubin: 0.4 mg/dL (ref 0.3–1.2)
Total Protein: 7 g/dL (ref 6.0–8.3)

## 2012-12-18 LAB — POCT I-STAT TROPONIN I: Troponin i, poc: 0 ng/mL (ref 0.00–0.08)

## 2012-12-18 LAB — URINALYSIS, MICROSCOPIC ONLY
Glucose, UA: NEGATIVE mg/dL
Hgb urine dipstick: NEGATIVE
Ketones, ur: 15 mg/dL — AB
Nitrite: NEGATIVE
Protein, ur: 30 mg/dL — AB
Specific Gravity, Urine: 1.036 — ABNORMAL HIGH (ref 1.005–1.030)
Urobilinogen, UA: 1 mg/dL (ref 0.0–1.0)
pH: 6 (ref 5.0–8.0)

## 2012-12-18 LAB — LIPASE, BLOOD: Lipase: 23 U/L (ref 11–59)

## 2012-12-18 MED ORDER — HYDROMORPHONE HCL PF 1 MG/ML IJ SOLN
1.0000 mg | Freq: Once | INTRAMUSCULAR | Status: AC
  Administered 2012-12-18: 1 mg via INTRAVENOUS
  Filled 2012-12-18: qty 1

## 2012-12-18 MED ORDER — MORPHINE SULFATE 4 MG/ML IJ SOLN: 2.0000 mg | Freq: Once | INTRAMUSCULAR | Status: DC

## 2012-12-18 MED ORDER — OSELTAMIVIR PHOSPHATE 75 MG PO CAPS: 75.0000 mg | ORAL_CAPSULE | Freq: Two times a day (BID) | ORAL | Status: DC

## 2012-12-18 MED ORDER — OXYCODONE-ACETAMINOPHEN 5-325 MG PO TABS: 2.0000 | ORAL_TABLET | ORAL | Status: DC | PRN

## 2012-12-18 MED ORDER — SODIUM CHLORIDE 0.9 % IV SOLN
INTRAVENOUS | Status: DC
  Administered 2012-12-18: 23:00:00 via INTRAVENOUS

## 2012-12-18 MED ORDER — ONDANSETRON 4 MG PO TBDP: 4.0000 mg | ORAL_TABLET | Freq: Three times a day (TID) | ORAL | Status: DC | PRN

## 2012-12-18 MED ORDER — ONDANSETRON HCL 4 MG/2ML IJ SOLN
4.0000 mg | Freq: Once | INTRAMUSCULAR | Status: AC
  Administered 2012-12-18: 4 mg via INTRAVENOUS
  Filled 2012-12-18: qty 2

## 2012-12-18 MED ORDER — SODIUM CHLORIDE 0.9 % IV BOLUS (SEPSIS)
500.0000 mL | Freq: Once | INTRAVENOUS | Status: AC
  Administered 2012-12-18: 500 mL via INTRAVENOUS

## 2012-12-18 MED ORDER — POTASSIUM CHLORIDE CRYS ER 20 MEQ PO TBCR
40.0000 meq | EXTENDED_RELEASE_TABLET | Freq: Once | ORAL | Status: AC
  Administered 2012-12-18: 40 meq via ORAL
  Filled 2012-12-18: qty 2

## 2012-12-18 NOTE — ED Notes (Addendum)
Pt reports all over body aches, epigastric pain, nausea, diarrhea, hot flashes, chills, and headache starting this am. Pt denies any chest pain or SOB, sts "I just don't feel right, I had a heart attack just before Christmas." Pt denies her symptoms being the same but sts "I just don't feel right."

## 2012-12-18 NOTE — ED Provider Notes (Signed)
History     CSN: [id number]  Arrival date & time 12/18/12  2019   First MD Initiated Contact with Patient 12/18/12 2143      Chief Complaint  Patient presents with  . Diarrhea  . Generalized Body Aches    (Consider location/radiation/quality/duration/timing/severity/associated sxs/prior treatment) HPI Comments: Patient is a 47 year old female who presents with a 1 day history of multiple symptoms including myalgias, nausea, diarrhea, headache, and chills. Symptoms started gradually and progressively worsened throughout the day. She has not tried anything for symptoms. No aggravating/alleviating factors. No known sick contacts.    Past Medical History  Diagnosis Date  . Hypertension     Controlled - she states she is on BP meds for HSP  . Anxiety   . Hypothyroidism   . GERD (gastroesophageal reflux disease)   . ADHD (attention deficit hyperactivity disorder)   . 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 GI bleed     2006: secondary to leukoclastic vasculitis  per chart  . Henoch-Schonlein purpura     HSP tx at [facility] in 2006 (also h/o leukocytoclastic vasculitis per chart 2006)  . Asthma   . Sleep apnea     "lost 50#; no problems w/it now" (11/22/2012)  . Migraines   . Depression   . Chronic kidney disease     in setting of HSP 2006  . CAD (coronary artery disease)     a. 11/2012 Acute Anterior STEMI/Cath: Nonobs dzs except subtotal occlusion of small, 1mm D2, EF 55-65%;  b. 11/2012  Echo: EF 60-65%, nl wall motion, Gr 2 DD.  . Tobacco abuse   . Coronary vasospasm     a. presumed    Past Surgical History  Procedure Date  . Appendectomy 1970's  . Cesarean section 1995; 1996; 1999  . Dilation and curettage of uterus 1990's  . Cardiac catheterization 11/22/2012    "first one" (11/22/2012)    Family History  Problem Relation Age of Onset  . Stroke Father   . Heart disease Neg Hx   . Hypertension  Mother     History  Substance Use Topics  . Smoking status: Current Every Day Smoker -- 0.5 packs/day for 25 years    Types: Cigarettes  . Smokeless tobacco: Never Used  . Alcohol Use: No    OB History    Grav Para Term Preterm Abortions TAB SAB Ect Mult Living                  Review of Systems  Constitutional: Positive for chills, diaphoresis and fatigue.  Gastrointestinal: Positive for nausea, abdominal pain and diarrhea.  Musculoskeletal: Positive for myalgias.  All other systems reviewed and are negative.    Allergies  Levaquin and Nsaids  Home Medications   Current Outpatient Rx  Name  Route  Sig  Dispense  Refill  . ALPRAZOLAM 0.25 MG PO TABS   Oral   Take 0.125 mg by mouth daily as needed. States she takes maybe once or twice a week         . AMLODIPINE BESYLATE 5 MG PO TABS   Oral   Take 1 tablet (5 mg total) by mouth daily.   30 tablet   6   . ASPIRIN 81 MG PO TBEC   Oral   Take 1 tablet (81 mg total) by mouth daily.         . ATORVASTATIN CALCIUM 40  MG PO TABS   Oral   Take 1 tablet (40 mg total) by mouth daily at 6 PM.   30 tablet   6   . CALCIUM CARBONATE-VITAMIN D 500-200 MG-UNIT PO TABS   Oral   Take 1 tablet by mouth daily.         Marland Kitchen CLOPIDOGREL BISULFATE 75 MG PO TABS   Oral   Take 1 tablet (75 mg total) by mouth daily with breakfast.   30 tablet   6   . DESVENLAFAXINE SUCCINATE ER 50 MG PO TB24   Oral   Take 1 tablet (50 mg total) by mouth daily.   90 tablet   3   . ENALAPRIL MALEATE 5 MG PO TABS   Oral   Take 1 tablet (5 mg total) by mouth daily.   30 tablet   6   . HYDROCODONE-ACETAMINOPHEN 5-500 MG PO TABS   Oral   Take 1 tablet by mouth every 4 (four) hours as needed. For pain         . LEVOTHYROXINE SODIUM 75 MCG PO TABS   Oral   Take 1 tablet (75 mcg total) by mouth daily.   90 tablet   3   . NITROGLYCERIN 0.4 MG SL SUBL   Sublingual   Place 1 tablet (0.4 mg total) under the tongue every 5 (five)  minutes as needed for chest pain.   25 tablet   3   . PANTOPRAZOLE SODIUM 40 MG PO TBEC   Oral   Take 1 tablet (40 mg total) by mouth 2 (two) times daily before a meal.   60 tablet   6     BP 127/69  Pulse 95  Temp 99.1 F (37.3 C) (Oral)  Resp 18  SpO2 95%  Physical Exam  Nursing note and vitals reviewed. Constitutional: She is oriented to person, place, and time. She appears well-developed and well-nourished. No distress.  HENT:  Head: Normocephalic and atraumatic.  Mouth/Throat: Oropharynx is clear and moist. No oropharyngeal exudate.  Eyes: Conjunctivae normal are normal. No scleral icterus.  Neck: Normal range of motion. Neck supple.  Cardiovascular: Normal rate and regular rhythm.  Exam reveals no gallop and no friction rub.   No murmur heard. Pulmonary/Chest: Effort normal and breath sounds normal. She has no wheezes. She has no rales. She exhibits no tenderness.  Abdominal: Soft. She exhibits no distension. There is no tenderness. There is no rebound and no guarding.  Musculoskeletal: Normal range of motion.  Neurological: She is alert and oriented to person, place, and time. Coordination normal.       Speech is goal-oriented. Moves limbs without ataxia.   Skin: Skin is warm and dry. She is not diaphoretic.  Psychiatric: She has a normal mood and affect. Her behavior is normal.    ED Course  Procedures (including critical care time)  Labs Reviewed  CBC WITH DIFFERENTIAL - Abnormal; Notable for the following:    Hemoglobin 16.0 (*)     HCT 46.7 (*)     Platelets 140 (*)     Neutrophils Relative 90 (*)     Neutro Abs 8.2 (*)     Lymphocytes Relative 6 (*)     Lymphs Abs 0.5 (*)     All other components within normal limits  COMPREHENSIVE METABOLIC PANEL - Abnormal; Notable for the following:    Potassium 3.2 (*)     Glucose, Bld 108 (*)     All other components within normal  limits  LIPASE, BLOOD  POCT I-STAT TROPONIN I  URINALYSIS, MICROSCOPIC ONLY    No results found.   1. Viral illness       MDM  10:07 PM Patient will have fluids, PO potassium, and zofran for symptoms. Patient likely has viral illness. Patient afebrile with unremarkable labs. I will treat her symptomatically.   11:43 PM Patient feeling better. Vitals stable for discharge. I will prescribe Percocet, Tamiflu and zofran for symptoms. Patient instructed to return with worsening or concerning symptoms.        Emilia Beck, PA-C 12/18/12 2343

## 2012-12-19 NOTE — ED Provider Notes (Signed)
Medical screening examination/treatment/procedure(s) were performed by non-physician practitioner and as supervising physician I was immediately available for consultation/collaboration.   David H Yao, MD 12/19/12 0010 

## 2012-12-20 ENCOUNTER — Encounter (HOSPITAL_COMMUNITY)
Admission: RE | Admit: 2012-12-20 | Discharge: 2012-12-20 | Payer: 59 | Source: Ambulatory Visit | Attending: Cardiovascular Disease | Admitting: Cardiovascular Disease

## 2012-12-20 NOTE — Progress Notes (Signed)
[name] came for her first day of exercise today.  [name] went to the ED with complaints of diarrhea headache and not feeling well.  [name] says her potassium was 3.2 and she was given a supplement.  Advised [name] not to exercise today.  Telemetry Sinus 79.  [name] has a follow up appointment with Dr Lenord Fellers this Friday.

## 2012-12-22 ENCOUNTER — Encounter: Payer: Self-pay | Admitting: Internal Medicine

## 2012-12-22 ENCOUNTER — Encounter (HOSPITAL_COMMUNITY): Payer: 59

## 2012-12-22 ENCOUNTER — Ambulatory Visit (INDEPENDENT_AMBULATORY_CARE_PROVIDER_SITE_OTHER): Payer: 59 | Admitting: Internal Medicine

## 2012-12-22 VITALS — BP 106/72 | HR 76 | Temp 97.6°F | Wt 189.5 lb

## 2012-12-22 DIAGNOSIS — E876 Hypokalemia: Secondary | ICD-10-CM

## 2012-12-22 LAB — CBC WITH DIFFERENTIAL/PLATELET
Basophils Absolute: 0 10*3/uL (ref 0.0–0.1)
Basophils Relative: 0 % (ref 0–1)
Eosinophils Absolute: 0.3 10*3/uL (ref 0.0–0.7)
Eosinophils Relative: 4 % (ref 0–5)
HCT: 41.3 % (ref 36.0–46.0)
Hemoglobin: 14.5 g/dL (ref 12.0–15.0)
Lymphocytes Relative: 33 % (ref 12–46)
Lymphs Abs: 2.3 10*3/uL (ref 0.7–4.0)
MCH: 31.7 pg (ref 26.0–34.0)
MCHC: 35.1 g/dL (ref 30.0–36.0)
MCV: 90.2 fL (ref 78.0–100.0)
Monocytes Absolute: 0.6 10*3/uL (ref 0.1–1.0)
Monocytes Relative: 9 % (ref 3–12)
Neutro Abs: 3.6 10*3/uL (ref 1.7–7.7)
Neutrophils Relative %: 54 % (ref 43–77)
Platelets: 173 10*3/uL (ref 150–400)
RBC: 4.58 MIL/uL (ref 3.87–5.11)
RDW: 12.9 % (ref 11.5–15.5)
WBC: 6.8 10*3/uL (ref 4.0–10.5)

## 2012-12-22 LAB — COMPREHENSIVE METABOLIC PANEL
ALT: 78 U/L — ABNORMAL HIGH (ref 0–35)
AST: 48 U/L — ABNORMAL HIGH (ref 0–37)
Albumin: 4.1 g/dL (ref 3.5–5.2)
Alkaline Phosphatase: 70 U/L (ref 39–117)
BUN: 3 mg/dL — ABNORMAL LOW (ref 6–23)
CO2: 32 mEq/L (ref 19–32)
Calcium: 9 mg/dL (ref 8.4–10.5)
Chloride: 102 mEq/L (ref 96–112)
Creat: 0.73 mg/dL (ref 0.50–1.10)
Glucose, Bld: 84 mg/dL (ref 70–99)
Potassium: 4 mEq/L (ref 3.5–5.3)
Sodium: 138 mEq/L (ref 135–145)
Total Bilirubin: 0.4 mg/dL (ref 0.3–1.2)
Total Protein: 6.2 g/dL (ref 6.0–8.3)

## 2012-12-24 DIAGNOSIS — I219 Acute myocardial infarction, unspecified: Secondary | ICD-10-CM

## 2012-12-25 ENCOUNTER — Other Ambulatory Visit: Payer: Self-pay

## 2012-12-25 ENCOUNTER — Encounter (HOSPITAL_COMMUNITY)
Admission: RE | Admit: 2012-12-25 | Discharge: 2012-12-25 | Disposition: A | Discharge status: Home / Self Care | Payer: 59 | Source: Ambulatory Visit | Attending: Cardiovascular Disease | Admitting: Cardiovascular Disease

## 2012-12-25 NOTE — Progress Notes (Signed)
Pt started cardiac rehab today.  Pt tolerated light exercise without difficulty. Telemetry rhythm Sinus without ectopy.  Vital signs stable.  Repeat K was 4.0 at Dr Beryle Quant office this past Friday. Will continue to monitor the patient throughout  the program.

## 2012-12-25 NOTE — Progress Notes (Signed)
Patient informed. Labs scheduled for 01/15/2013

## 2012-12-27 ENCOUNTER — Encounter (HOSPITAL_COMMUNITY)
Admission: RE | Admit: 2012-12-27 | Discharge: 2012-12-27 | Disposition: A | Discharge status: Home / Self Care | Payer: 59 | Source: Ambulatory Visit | Attending: Cardiovascular Disease | Admitting: Cardiovascular Disease

## 2012-12-29 ENCOUNTER — Other Ambulatory Visit: Payer: Self-pay

## 2012-12-29 ENCOUNTER — Encounter (HOSPITAL_COMMUNITY)
Admission: RE | Admit: 2012-12-29 | Discharge: 2012-12-29 | Payer: 59 | Source: Ambulatory Visit | Attending: Cardiovascular Disease | Admitting: Cardiovascular Disease

## 2012-12-29 MED ORDER — HYDROCODONE-ACETAMINOPHEN 5-325 MG PO TABS: 1.0000 | ORAL_TABLET | Freq: Two times a day (BID) | ORAL | Status: DC | PRN

## 2012-12-29 NOTE — Progress Notes (Signed)
[name] will not be able to exercise today due to work commitment.  [name] was tearful this morning and feels over whelmed with all her responsibilities. Emotional support given.  Appointment made for [name] to follow up with Theda Belfast the chaplain on Monday.

## 2013-01-01 ENCOUNTER — Encounter (HOSPITAL_COMMUNITY)
Admission: RE | Admit: 2013-01-01 | Discharge: 2013-01-01 | Disposition: A | Discharge status: Home / Self Care | Payer: 59 | Source: Ambulatory Visit | Attending: Cardiovascular Disease | Admitting: Cardiovascular Disease

## 2013-01-01 ENCOUNTER — Other Ambulatory Visit (INDEPENDENT_AMBULATORY_CARE_PROVIDER_SITE_OTHER): Payer: 59

## 2013-01-01 ENCOUNTER — Other Ambulatory Visit: Payer: Self-pay | Admitting: Nurse Practitioner

## 2013-01-01 DIAGNOSIS — E785 Hyperlipidemia, unspecified: Secondary | ICD-10-CM

## 2013-01-01 DIAGNOSIS — I259 Chronic ischemic heart disease, unspecified: Secondary | ICD-10-CM

## 2013-01-01 LAB — BASIC METABOLIC PANEL
BUN: 13 mg/dL (ref 6–23)
CO2: 31 mEq/L (ref 19–32)
Calcium: 9 mg/dL (ref 8.4–10.5)
Chloride: 102 mEq/L (ref 96–112)
Creatinine, Ser: 0.8 mg/dL (ref 0.4–1.2)
GFR: 83.9 mL/min (ref >60.00)
Glucose, Bld: 106 mg/dL — ABNORMAL HIGH (ref 70–99)
Potassium: 4.3 mEq/L (ref 3.5–5.1)
Sodium: 141 mEq/L (ref 135–145)

## 2013-01-01 LAB — HEPATIC FUNCTION PANEL
ALT: 30 U/L (ref 0–35)
AST: 24 U/L (ref 0–37)
Albumin: 3.9 g/dL (ref 3.5–5.2)
Alkaline Phosphatase: 61 U/L (ref 39–117)
Bilirubin, Direct: 0.1 mg/dL (ref 0.0–0.3)
Total Bilirubin: 0.5 mg/dL (ref 0.3–1.2)
Total Protein: 6.9 g/dL (ref 6.0–8.3)

## 2013-01-01 LAB — LIPID PANEL
Cholesterol: 127 mg/dL (ref 0–200)
HDL: 52.7 mg/dL (ref >39.00)
LDL Cholesterol: 61 mg/dL (ref 0–99)
Total CHOL/HDL Ratio: 2
Triglycerides: 69 mg/dL (ref 0.0–149.0)
VLDL: 13.8 mg/dL (ref 0.0–40.0)

## 2013-01-01 NOTE — Progress Notes (Signed)
Reviewed home exercise with pt today.  Pt plans to walk at home and children's school for exercise.  Reviewed THR, pulse, RPE, sign and symptoms, NTG use, and when to call 911 or MD.  Pt voiced understanding. Fabio Pierce, MA, ACSM RCEP

## 2013-01-03 ENCOUNTER — Encounter (HOSPITAL_COMMUNITY): Payer: 59

## 2013-01-05 ENCOUNTER — Encounter: Payer: Self-pay | Admitting: Cardiovascular Disease

## 2013-01-05 ENCOUNTER — Ambulatory Visit (INDEPENDENT_AMBULATORY_CARE_PROVIDER_SITE_OTHER): Payer: 59 | Admitting: Cardiovascular Disease

## 2013-01-05 ENCOUNTER — Encounter (HOSPITAL_COMMUNITY)
Admission: RE | Admit: 2013-01-05 | Discharge: 2013-01-05 | Disposition: A | Discharge status: Home / Self Care | Payer: 59 | Source: Ambulatory Visit | Attending: Cardiovascular Disease | Admitting: Cardiovascular Disease

## 2013-01-05 VITALS — BP 132/84 | HR 78 | Ht 65.0 in | Wt 191.0 lb

## 2013-01-05 DIAGNOSIS — I2510 Atherosclerotic heart disease of native coronary artery without angina pectoris: Secondary | ICD-10-CM

## 2013-01-05 NOTE — Progress Notes (Signed)
HPI:   47 year old woman presenting for followup evaluation. She has coronary artery disease and presented in December with an acute anterior wall MI. She presumably had a vasospastic event, but her troponin peaked at greater than 20. Remarkably her left ventricular function was normal. Cardiac catheterization there was subtotal occlusion of a very small diagonal branch but otherwise nonobstructive disease was noted elsewhere. Her initial EKG was very impressive with diffuse anterolateral injury current. Recent lipids showed a cholesterol of 127, triglycerides 69, HDL 53, and LDL 61. Her LFTs were normal.  Overall the patient is doing well. She denies recurrence of chest pain. She's had no exertional symptoms. She denies shortness of breath. She denies leg swelling or palpitations. She complains of generalized fatigue and wonders if it's related to medication side effects. She's not been able to quit smoking.  Outpatient Encounter Prescriptions as of 01/05/2013  Medication Sig Dispense Refill  . ALPRAZolam (XANAX) 0.25 MG tablet Take 0.125 mg by mouth daily as needed. States she takes maybe once or twice a week      . amLODipine (NORVASC) 5 MG tablet Take 1 tablet (5 mg total) by mouth daily.  30 tablet  6  . aspirin EC 81 MG EC tablet Take 1 tablet (81 mg total) by mouth daily.      Marland Kitchen atorvastatin (LIPITOR) 40 MG tablet Take 1 tablet (40 mg total) by mouth daily at 6 PM.  30 tablet  6  . calcium-vitamin D (OSCAL WITH D) 500-200 MG-UNIT per tablet Take 1 tablet by mouth daily.      . clopidogrel (PLAVIX) 75 MG tablet Take 1 tablet (75 mg total) by mouth daily with breakfast.  30 tablet  6  . desvenlafaxine (PRISTIQ) 50 MG 24 hr tablet Take 1 tablet (50 mg total) by mouth daily.  90 tablet  3  . enalapril (VASOTEC) 5 MG tablet Take 1 tablet (5 mg total) by mouth daily.  30 tablet  6  . HYDROcodone-acetaminophen (NORCO/VICODIN) 5-325 MG per tablet Take 1 tablet by mouth 2 (two) times daily as needed  for pain.  60 tablet  3  . levothyroxine (SYNTHROID, LEVOTHROID) 75 MCG tablet Take 1 tablet (75 mcg total) by mouth daily.  90 tablet  3  . nitroGLYCERIN (NITROSTAT) 0.4 MG SL tablet Place 1 tablet (0.4 mg total) under the tongue every 5 (five) minutes as needed for chest pain.  25 tablet  3  . ondansetron (ZOFRAN ODT) 4 MG disintegrating tablet Take 1 tablet (4 mg total) by mouth every 8 (eight) hours as needed for nausea.  10 tablet  0  . oseltamivir (TAMIFLU) 75 MG capsule Take 1 capsule (75 mg total) by mouth every 12 (twelve) hours.  10 capsule  0  . pantoprazole (PROTONIX) 40 MG tablet Take 1 tablet (40 mg total) by mouth 2 (two) times daily before a meal.  60 tablet  6    Allergies  Allergen Reactions  . Levaquin (Levofloxacin) Hives  . Nsaids Other (See Comments)    Able to take aspirin "Had HSP; since then they treat my kidneys very nicely" (11/22/2012)    Past Medical History  Diagnosis Date  . Hypertension     Controlled - she states she is on BP meds for HSP  . Anxiety   . Hypothyroidism   . GERD (gastroesophageal reflux disease)   . ADHD (attention deficit hyperactivity disorder)   . Terminal ileitis     2006: terminal ileitis and colitis secondary to  leukoclastic vasculitis per chart  . GI bleed     2006: secondary to leukoclastic vasculitis  per chart  . Henoch-Schonlein purpura     HSP tx at [facility] in 2006 (also h/o leukocytoclastic vasculitis per chart 2006)  . Asthma   . Sleep apnea     "lost 50#; no problems w/it now" (11/22/2012)  . Migraines   . Depression   . Chronic kidney disease     in setting of HSP 2006  . CAD (coronary artery disease)     a. 11/2012 Acute Anterior STEMI/Cath: Nonobs dzs except subtotal occlusion of small, 1mm D2, EF 55-65%;  b. 11/2012  Echo: EF 60-65%, nl wall motion, Gr 2 DD.  . Tobacco abuse   . Coronary vasospasm     a. presumed   BP 132/84  Pulse 78  Ht 5\' 5"  (1.651 m)  Wt 86.637 kg (191 lb)  BMI 31.78 kg/m2  PHYSICAL  EXAM: Pt is alert and oriented, NAD HEENT: normal Neck: JVP - normal, carotids 2+= without bruits Lungs: CTA bilaterally CV: RRR without murmur or gallop Abd: soft, NT, Positive BS, no hepatomegaly Ext: no C/C/E, distal pulses intact and equal Skin: warm/dry no rash  ASSESSMENT AND PLAN: 1. Coronary atherosclerosis, native vessel. Patient with aborted anterior MI and probably secondary to a coronary vasospastic event. She will continue on her same medications as she has had no recurrent symptoms of ischemia. She understands that tobacco cessation is the most important lifestyle modification she can make to prevent recurrence. I will see her back in 4 months for followup evaluation.  2. Essential hypertension. Continue amlodipine and enalapril.  3. Hyperlipidemia. Recent lipids reviewed as above. Excellent lipid panel. Continue atorvastatin.  Tonny Bollman 01/05/2013 12:32 PM

## 2013-01-05 NOTE — Patient Instructions (Signed)
Your physician recommends that you schedule a follow-up appointment in: 4 MONTHS with Dr Cooper  Your physician recommends that you continue on your current medications as directed. Please refer to the Current Medication list given to you today.  

## 2013-01-08 ENCOUNTER — Encounter (HOSPITAL_COMMUNITY)
Admission: RE | Admit: 2013-01-08 | Discharge: 2013-01-08 | Disposition: A | Discharge status: Home / Self Care | Payer: 59 | Source: Ambulatory Visit | Attending: Cardiovascular Disease | Admitting: Cardiovascular Disease

## 2013-01-08 DIAGNOSIS — I2109 ST elevation (STEMI) myocardial infarction involving other coronary artery of anterior wall: Secondary | ICD-10-CM | POA: Insufficient documentation

## 2013-01-08 DIAGNOSIS — Z5189 Encounter for other specified aftercare: Secondary | ICD-10-CM | POA: Insufficient documentation

## 2013-01-10 ENCOUNTER — Encounter (HOSPITAL_COMMUNITY)
Admission: RE | Admit: 2013-01-10 | Discharge: 2013-01-10 | Disposition: A | Discharge status: Home / Self Care | Payer: 59 | Source: Ambulatory Visit | Attending: Cardiovascular Disease | Admitting: Cardiovascular Disease

## 2013-01-12 ENCOUNTER — Encounter (HOSPITAL_COMMUNITY): Payer: 59

## 2013-01-12 ENCOUNTER — Encounter: Payer: Self-pay | Admitting: Cardiovascular Disease

## 2013-01-12 NOTE — Telephone Encounter (Signed)
New Problem:    Patient called in wanting to speak with you about a conversation she had with Dr. Excell Seltzer.  Please call back.

## 2013-01-12 NOTE — Telephone Encounter (Signed)
This encounter was created in error - please disregard.

## 2013-01-15 ENCOUNTER — Encounter (HOSPITAL_COMMUNITY): Payer: 59

## 2013-01-15 ENCOUNTER — Telehealth: Payer: Self-pay | Admitting: Internal Medicine

## 2013-01-15 ENCOUNTER — Other Ambulatory Visit: Payer: 59 | Admitting: Internal Medicine

## 2013-01-15 ENCOUNTER — Encounter (HOSPITAL_COMMUNITY)
Admission: RE | Admit: 2013-01-15 | Discharge: 2013-01-15 | Disposition: A | Discharge status: Home / Self Care | Payer: 59 | Source: Ambulatory Visit | Attending: Cardiovascular Disease | Admitting: Cardiovascular Disease

## 2013-01-15 NOTE — Telephone Encounter (Signed)
Spoke with patient and advised of Dr. Beryle Quant comments.  Patient verbalizes understanding.  Putting lab work in the mail to patient.  Patient is scheduled for CPE in May, 2014.  Patient advised to contact us before then if she has any questions or problems.  Patient verbalized understanding.

## 2013-01-15 NOTE — Telephone Encounter (Signed)
When she had heart attack, one liver function was up which was related to MI in retrospect-- SGOT was 71 and muscle damage can do this. [name]'s note from Jan 27 says lipid and liver were normal then and that she will recheck these in 6 months so no need for me to do LFTs now.

## 2013-01-17 ENCOUNTER — Encounter (HOSPITAL_COMMUNITY): Payer: 59

## 2013-01-19 ENCOUNTER — Encounter (HOSPITAL_COMMUNITY): Payer: 59

## 2013-01-22 ENCOUNTER — Encounter (HOSPITAL_COMMUNITY): Payer: 59

## 2013-01-24 ENCOUNTER — Encounter (HOSPITAL_COMMUNITY): Payer: 59

## 2013-01-26 ENCOUNTER — Encounter (HOSPITAL_COMMUNITY)
Admission: RE | Admit: 2013-01-26 | Discharge: 2013-01-26 | Disposition: A | Discharge status: Home / Self Care | Payer: 59 | Source: Ambulatory Visit | Attending: Cardiovascular Disease | Admitting: Cardiovascular Disease

## 2013-01-26 NOTE — Progress Notes (Signed)
[name] 47 y.o. female Nutrition Note Spoke with pt.  Nutrition Plan and Nutrition Survey goals reviewed with pt. There are many ways the pt can improve her eating habits. Pt is trying to quit tobacco use and reports "it's not going very well." Pt c/o feeling agitated when she does not use tobacco. Pt is off of Adderall and pt c/o difficulty completing tasks. Per nutrition screen, pt wants to lose wt. Pt encouraged to focus on one area at a time (e.g. Tobacco cessation). This Clinical research associate asked pt re: need for psychological counseling. Pt reports she has seen a counselor in the past and knows she should make an appointment. Pt expressed understanding of the information reviewed. Pt aware of nutrition education classes offered and wants to attend nutrition classes and is unsure if she will be able to fit classes into her schedule. Nutrition Diagnosis   Food-and nutrition-related knowledge deficit related to lack of exposure to information as related to diagnosis of: ? CVD ? Pre-DM (A1c 5.8)     Obesity related to excessive energy intake as evidenced by a BMI of 31.4 Nutrition RX/ Re-Estimated Daily Nutrition Needs for: wt maintenance 2100-2400 Kcal, 70-80 gm fat, 14-16 gm sat fat, 2.1-2.4 gm trans-fat, <1500 mg sodium   Nutrition Intervention   Pt's individual nutrition plan including cholesterol goals reviewed with pt.   Benefits of adopting Therapeutic Lifestyle Changes discussed when Medficts reviewed.   Pt to attend the Portion Distortion class   Pt to attend the  ? Nutrition I class                     ? Nutrition II class   Pt given handouts for: ? Nutrition I class ? Nutrition II class   Continue client-centered nutrition education by RD, as part of interdisciplinary care.  Goal(s)   Pt to identify and limit food sources of saturated fat, trans fat, and cholesterol   Pt to describe the benefit of including fruits, vegetables, whole grains, and low-fat dairy products in a heart  healthy meal plan.  Monitor and Evaluate progress toward nutrition goal with team. Nutrition Risk:  Low

## 2013-01-29 ENCOUNTER — Telehealth: Payer: Self-pay | Admitting: Internal Medicine

## 2013-01-29 ENCOUNTER — Encounter (HOSPITAL_COMMUNITY)
Admission: RE | Admit: 2013-01-29 | Discharge: 2013-01-29 | Disposition: A | Discharge status: Home / Self Care | Payer: 59 | Source: Ambulatory Visit | Attending: Cardiovascular Disease | Admitting: Cardiovascular Disease

## 2013-01-29 NOTE — Telephone Encounter (Signed)
We have received information from cardiac rehabilitation nurse that patient may be depressed. She is clearly unhappy with her job right now. She has some financial stress. Feels that she cannot focus at work. She previously took Adderall but I am not comfortable prescribing that after her MI. I am going to suggest to her that she may benefit from a med consult  and counseling with psychiatrist who is well familiar with attention deficit disorder and depression therapy. She may need to consider a change in jobs.

## 2013-01-29 NOTE — Progress Notes (Signed)
Reviewed [name]'s quality of life questionnaire.  [name] says she is feeling depressed and feels tired all the time. Dr Beryle Quant office called and notified. [name] has an appointment to see Dr Lenord Fellers this Friday. Will fax exercise flow sheets and quality of life questionnaire for Dr Lenord Fellers to review,

## 2013-01-30 ENCOUNTER — Telehealth: Payer: Self-pay | Admitting: Internal Medicine

## 2013-01-31 ENCOUNTER — Encounter (HOSPITAL_COMMUNITY)
Admission: RE | Admit: 2013-01-31 | Discharge: 2013-01-31 | Disposition: A | Discharge status: Home / Self Care | Payer: 59 | Source: Ambulatory Visit | Attending: Cardiovascular Disease | Admitting: Cardiovascular Disease

## 2013-02-02 ENCOUNTER — Encounter (HOSPITAL_COMMUNITY): Payer: 59

## 2013-02-02 ENCOUNTER — Ambulatory Visit: Payer: 59 | Admitting: Internal Medicine

## 2013-02-05 ENCOUNTER — Encounter (HOSPITAL_COMMUNITY): Payer: 59

## 2013-02-07 ENCOUNTER — Encounter (HOSPITAL_COMMUNITY)
Admission: RE | Admit: 2013-02-07 | Discharge: 2013-02-07 | Disposition: A | Discharge status: Home / Self Care | Payer: 59 | Source: Ambulatory Visit | Attending: Cardiovascular Disease | Admitting: Cardiovascular Disease

## 2013-02-07 DIAGNOSIS — I2109 ST elevation (STEMI) myocardial infarction involving other coronary artery of anterior wall: Secondary | ICD-10-CM | POA: Insufficient documentation

## 2013-02-07 DIAGNOSIS — Z5189 Encounter for other specified aftercare: Secondary | ICD-10-CM | POA: Insufficient documentation

## 2013-02-09 ENCOUNTER — Encounter (HOSPITAL_COMMUNITY): Payer: 59

## 2013-02-12 ENCOUNTER — Encounter (HOSPITAL_COMMUNITY): Payer: 59

## 2013-02-13 ENCOUNTER — Other Ambulatory Visit: Payer: Self-pay | Admitting: Internal Medicine

## 2013-02-14 ENCOUNTER — Encounter (HOSPITAL_COMMUNITY): Payer: 59

## 2013-02-16 ENCOUNTER — Encounter (HOSPITAL_COMMUNITY): Payer: 59

## 2013-02-19 ENCOUNTER — Encounter (HOSPITAL_COMMUNITY): Payer: 59

## 2013-02-21 ENCOUNTER — Encounter (HOSPITAL_COMMUNITY): Payer: 59

## 2013-02-23 ENCOUNTER — Encounter (HOSPITAL_COMMUNITY): Payer: 59

## 2013-02-26 ENCOUNTER — Encounter (HOSPITAL_COMMUNITY): Payer: 59

## 2013-02-28 ENCOUNTER — Encounter (HOSPITAL_COMMUNITY): Payer: 59

## 2013-03-02 ENCOUNTER — Encounter (HOSPITAL_COMMUNITY): Admission: RE | Admit: 2013-03-02 | Payer: 59 | Source: Ambulatory Visit

## 2013-03-02 NOTE — Progress Notes (Signed)
[name] has not exercised at cardiac rehab since February 07, 2013.  Will discharge [name] on Monday is she does not return to exercise.  Mailed letter to patient.

## 2013-03-05 ENCOUNTER — Encounter (HOSPITAL_COMMUNITY)
Admission: RE | Admit: 2013-03-05 | Discharge: 2013-03-05 | Disposition: A | Discharge status: Home / Self Care | Payer: 59 | Source: Ambulatory Visit | Attending: Cardiovascular Disease | Admitting: Cardiovascular Disease

## 2013-03-07 ENCOUNTER — Encounter: Payer: Self-pay | Admitting: Cardiovascular Disease

## 2013-03-07 ENCOUNTER — Encounter (HOSPITAL_COMMUNITY): Payer: 59

## 2013-03-09 ENCOUNTER — Encounter (HOSPITAL_COMMUNITY): Payer: 59

## 2013-03-12 ENCOUNTER — Encounter (HOSPITAL_COMMUNITY): Payer: 59

## 2013-03-14 ENCOUNTER — Encounter (HOSPITAL_COMMUNITY): Payer: 59

## 2013-03-16 ENCOUNTER — Encounter (HOSPITAL_COMMUNITY): Payer: 59

## 2013-03-19 ENCOUNTER — Encounter (HOSPITAL_COMMUNITY): Payer: 59

## 2013-03-21 ENCOUNTER — Encounter (HOSPITAL_COMMUNITY): Payer: 59

## 2013-03-23 ENCOUNTER — Encounter (HOSPITAL_COMMUNITY): Payer: 59

## 2013-04-10 ENCOUNTER — Other Ambulatory Visit: Payer: 59 | Admitting: Internal Medicine

## 2013-04-10 DIAGNOSIS — Z0000 Encounter for general adult medical examination without abnormal findings: Secondary | ICD-10-CM

## 2013-04-10 DIAGNOSIS — E039 Hypothyroidism, unspecified: Secondary | ICD-10-CM

## 2013-04-10 DIAGNOSIS — I10 Essential (primary) hypertension: Secondary | ICD-10-CM

## 2013-04-10 LAB — LIPID PANEL
Cholesterol: 130 mg/dL (ref 0–200)
HDL: 46 mg/dL
LDL Cholesterol: 67 mg/dL (ref 0–99)
Total CHOL/HDL Ratio: 2.8 ratio
Triglycerides: 85 mg/dL
VLDL: 17 mg/dL (ref 0–40)

## 2013-04-10 LAB — CBC WITH DIFFERENTIAL/PLATELET
Basophils Absolute: 0 10*3/uL (ref 0.0–0.1)
Basophils Relative: 0 % (ref 0–1)
Eosinophils Absolute: 0.2 10*3/uL (ref 0.0–0.7)
Eosinophils Relative: 2 % (ref 0–5)
HCT: 45.4 % (ref 36.0–46.0)
Hemoglobin: 15.6 g/dL — ABNORMAL HIGH (ref 12.0–15.0)
Lymphocytes Relative: 32 % (ref 12–46)
Lymphs Abs: 2.5 10*3/uL (ref 0.7–4.0)
MCH: 31.9 pg (ref 26.0–34.0)
MCHC: 34.4 g/dL (ref 30.0–36.0)
MCV: 92.8 fL (ref 78.0–100.0)
Monocytes Absolute: 0.3 10*3/uL (ref 0.1–1.0)
Monocytes Relative: 4 % (ref 3–12)
Neutro Abs: 4.7 10*3/uL (ref 1.7–7.7)
Neutrophils Relative %: 62 % (ref 43–77)
Platelets: 201 10*3/uL (ref 150–400)
RBC: 4.89 MIL/uL (ref 3.87–5.11)
RDW: 12.8 % (ref 11.5–15.5)
WBC: 7.7 10*3/uL (ref 4.0–10.5)

## 2013-04-10 LAB — COMPREHENSIVE METABOLIC PANEL
ALT: 30 U/L (ref 0–35)
AST: 22 U/L (ref 0–37)
Albumin: 4.2 g/dL (ref 3.5–5.2)
Alkaline Phosphatase: 77 U/L (ref 39–117)
BUN: 11 mg/dL (ref 6–23)
CO2: 27 mEq/L (ref 19–32)
Calcium: 9.5 mg/dL (ref 8.4–10.5)
Chloride: 105 mEq/L (ref 96–112)
Creat: 0.8 mg/dL (ref 0.50–1.10)
Glucose, Bld: 89 mg/dL (ref 70–99)
Potassium: 4 mEq/L (ref 3.5–5.3)
Sodium: 142 mEq/L (ref 135–145)
Total Bilirubin: 0.4 mg/dL (ref 0.3–1.2)
Total Protein: 6.4 g/dL (ref 6.0–8.3)

## 2013-04-10 LAB — TSH: TSH: 1.235 u[IU]/mL (ref 0.350–4.500)

## 2013-04-11 LAB — VITAMIN D 25 HYDROXY (VIT D DEFICIENCY, FRACTURES): Vit D, 25-Hydroxy: 29 ng/mL — ABNORMAL LOW (ref 30–89)

## 2013-04-12 ENCOUNTER — Encounter: Payer: Self-pay | Admitting: Internal Medicine

## 2013-04-12 ENCOUNTER — Ambulatory Visit (INDEPENDENT_AMBULATORY_CARE_PROVIDER_SITE_OTHER): Payer: 59 | Admitting: Internal Medicine

## 2013-04-12 VITALS — BP 108/70 | HR 80 | Temp 98.4°F | Ht 65.0 in | Wt 196.0 lb

## 2013-04-12 DIAGNOSIS — I10 Essential (primary) hypertension: Secondary | ICD-10-CM

## 2013-04-12 DIAGNOSIS — F341 Dysthymic disorder: Secondary | ICD-10-CM

## 2013-04-12 DIAGNOSIS — K219 Gastro-esophageal reflux disease without esophagitis: Secondary | ICD-10-CM

## 2013-04-12 DIAGNOSIS — E669 Obesity, unspecified: Secondary | ICD-10-CM

## 2013-04-12 DIAGNOSIS — F419 Anxiety disorder, unspecified: Secondary | ICD-10-CM

## 2013-04-12 DIAGNOSIS — Z0000 Encounter for general adult medical examination without abnormal findings: Secondary | ICD-10-CM

## 2013-04-12 DIAGNOSIS — Z87891 Personal history of nicotine dependence: Secondary | ICD-10-CM

## 2013-04-12 DIAGNOSIS — Z8709 Personal history of other diseases of the respiratory system: Secondary | ICD-10-CM

## 2013-04-12 DIAGNOSIS — F32A Depression, unspecified: Secondary | ICD-10-CM

## 2013-04-12 DIAGNOSIS — E039 Hypothyroidism, unspecified: Secondary | ICD-10-CM

## 2013-04-12 DIAGNOSIS — G4733 Obstructive sleep apnea (adult) (pediatric): Secondary | ICD-10-CM

## 2013-04-12 DIAGNOSIS — F329 Major depressive disorder, single episode, unspecified: Secondary | ICD-10-CM

## 2013-04-12 DIAGNOSIS — Z8679 Personal history of other diseases of the circulatory system: Secondary | ICD-10-CM

## 2013-04-12 LAB — POCT URINALYSIS DIPSTICK
Bilirubin, UA: NEGATIVE
Blood, UA: NEGATIVE
Glucose, UA: NEGATIVE
Ketones, UA: NEGATIVE
Leukocytes, UA: NEGATIVE
Nitrite, UA: NEGATIVE
Protein, UA: NEGATIVE
Spec Grav, UA: 1.01
Urobilinogen, UA: NEGATIVE
pH, UA: 7

## 2013-04-26 ENCOUNTER — Encounter: Payer: Self-pay | Admitting: Internal Medicine

## 2013-04-26 ENCOUNTER — Ambulatory Visit (INDEPENDENT_AMBULATORY_CARE_PROVIDER_SITE_OTHER): Payer: 59 | Admitting: Internal Medicine

## 2013-04-26 VITALS — BP 114/78 | Temp 98.1°F | Wt 196.0 lb

## 2013-04-26 DIAGNOSIS — J209 Acute bronchitis, unspecified: Secondary | ICD-10-CM

## 2013-04-26 DIAGNOSIS — Z87891 Personal history of nicotine dependence: Secondary | ICD-10-CM

## 2013-04-26 DIAGNOSIS — I252 Old myocardial infarction: Secondary | ICD-10-CM

## 2013-04-26 MED ORDER — CEFTRIAXONE SODIUM 1 G IJ SOLR
1.0000 g | Freq: Once | INTRAMUSCULAR | Status: AC
  Administered 2013-04-26: 1 g via INTRAMUSCULAR

## 2013-05-04 ENCOUNTER — Encounter: Payer: Self-pay | Admitting: Internal Medicine

## 2013-05-04 ENCOUNTER — Ambulatory Visit: Payer: 59 | Admitting: Cardiovascular Disease

## 2013-05-04 NOTE — Patient Instructions (Signed)
Take Biaxin 500 mg twice daily for 10 days. Take prednisone dosepak as directed. Use Advair and Ventolin inhalers. You have been given 1 g IM Rocephin. Stop smoking.

## 2013-05-04 NOTE — Progress Notes (Signed)
  Subjective:    Patient ID: [name], female    DOB: [birth year], 47 y.o.   MRN: [id number]  HPI 47 year old white female who had myocardial infarction December 2013 and continues to smoke in today with acute URI symptoms. Has had sore throat cough and congestion. Some shortness of breath and wheezing. Long-standing history of asthma. Suspect she now has element of COPD from years of smoking. Having cough with some discolored sputum production. Has malaise and fatigue. Is allergic to Levaquin.  Oldest son graduates from high school in just a couple of days. She doesn't want to be sick for his graduation. 6    Review of Systems     Objective:   Physical Exam HEENT exam: Pharynx slightly injected without exudate. TMs are clear. Neck is supple without significant adenopathy. Chest without rales but has bronchial breath sound left lower lobe that does not clear with coughing. Cardiac exam regular rate and rhythm. Extremities without edema.        Assessment & Plan:  Acute bronchitis  History of MI December 2013  History of smoking  Plan: Biaxin 500 mg twice daily for 10 days. Sterapred DS 10 mg 6 day dosepak. Advair inhaler 250/50 one by mouth every 12 hours when necessary cough. Ventolin inhaler 2 sprays by mouth 4 times a day when necessary wheezing. Encouraged once again to stop smoking. 1 g IM Rocephin.

## 2013-05-09 ENCOUNTER — Ambulatory Visit (INDEPENDENT_AMBULATORY_CARE_PROVIDER_SITE_OTHER): Payer: 59 | Admitting: Cardiovascular Disease

## 2013-05-09 ENCOUNTER — Encounter: Payer: Self-pay | Admitting: Cardiovascular Disease

## 2013-05-09 VITALS — BP 118/64 | HR 69 | Ht 65.0 in | Wt 195.0 lb

## 2013-05-09 DIAGNOSIS — I2510 Atherosclerotic heart disease of native coronary artery without angina pectoris: Secondary | ICD-10-CM

## 2013-05-09 NOTE — Patient Instructions (Addendum)
Your physician wants you to follow-up in: 6 MONTHS.  You will receive a reminder letter in the mail two months in advance. If you don't receive a letter, please call our office to schedule the follow-up appointment.  Your physician recommends that you continue on your current medications as directed. Please refer to the Current Medication list given to you today.  

## 2013-05-09 NOTE — Progress Notes (Signed)
HPI:  47 year old woman presenting for followup evaluation. She is followed for coronary artery disease. She presented in December 2013 with an acute anterior wall MI. Cardiac catheterization demonstrated patency of the LAD with subtotal occlusion of a very small diagonal branch. She had a significant cardiac enzyme rise, but her LV function was within normal limits. Presumably this was a vasospastic event. She returns today for followup.  She's had no further chest discomfort. She recently took prednisone for an upper respiratory infection and noted palpitations with this. She's had no lightheadedness, syncope, or peripheral edema. She denies shortness of breath. She's been unable to quit smoking. She complains of generalized fatigue. She has some joint pains. She notes easy bruising. Otherwise no cardiac related complaints today  Outpatient Encounter Prescriptions as of 05/09/2013  Medication Sig Dispense Refill  . ALPRAZolam (XANAX) 0.25 MG tablet Take 0.125 mg by mouth daily as needed. States she takes maybe once or twice a week      . amLODipine (NORVASC) 5 MG tablet Take 1 tablet (5 mg total) by mouth daily.  30 tablet  6  . aspirin EC 81 MG EC tablet Take 1 tablet (81 mg total) by mouth daily.      Marland Kitchen atorvastatin (LIPITOR) 40 MG tablet Take 1 tablet (40 mg total) by mouth daily at 6 PM.  30 tablet  6  . calcium-vitamin D (OSCAL WITH D) 500-200 MG-UNIT per tablet Take 1 tablet by mouth daily.      . clopidogrel (PLAVIX) 75 MG tablet Take 1 tablet (75 mg total) by mouth daily with breakfast.  30 tablet  6  . enalapril (VASOTEC) 5 MG tablet Take 1 tablet (5 mg total) by mouth daily.  30 tablet  6  . HYDROcodone-acetaminophen (NORCO/VICODIN) 5-325 MG per tablet Take 1 tablet by mouth 2 (two) times daily as needed for pain.  60 tablet  3  . levothyroxine (SYNTHROID, LEVOTHROID) 75 MCG tablet Take 1 tablet (75 mcg total) by mouth daily.  90 tablet  3  . nitroGLYCERIN (NITROSTAT) 0.4 MG SL tablet  Place 1 tablet (0.4 mg total) under the tongue every 5 (five) minutes as needed for chest pain.  25 tablet  3  . ondansetron (ZOFRAN ODT) 4 MG disintegrating tablet Take 1 tablet (4 mg total) by mouth every 8 (eight) hours as needed for nausea.  10 tablet  0  . pantoprazole (PROTONIX) 40 MG tablet Take 1 tablet (40 mg total) by mouth 2 (two) times daily before a meal.  60 tablet  6  . PRISTIQ 50 MG 24 hr tablet TAKE 1 TABLET BY MOUTH ONCE DAILY  90 tablet  PRN   No facility-administered encounter medications on file as of 05/09/2013.    Allergies  Allergen Reactions  . Levaquin (Levofloxacin) Hives  . Nsaids Other (See Comments)    Able to take aspirin "Had HSP; since then they treat my kidneys very nicely" (11/22/2012)    Past Medical History  Diagnosis Date  . Hypertension     Controlled - she states she is on BP meds for HSP  . Anxiety   . Hypothyroidism   . GERD (gastroesophageal reflux disease)   . ADHD (attention deficit hyperactivity disorder)   . 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 GI bleed     2006: secondary to leukoclastic vasculitis  per chart  . Henoch-Schonlein purpura     HSP tx at  [facility] in 2006 (also h/o leukocytoclastic vasculitis per chart 2006)  . Asthma   . Sleep apnea     "lost 50#; no problems w/it now" (11/22/2012)  . Migraines   . Depression   . Chronic kidney disease     in setting of HSP 2006  . CAD (coronary artery disease)     a. 11/2012 Acute Anterior STEMI/Cath: Nonobs dzs except subtotal occlusion of small, 1mm D2, EF 55-65%;  b. 11/2012  Echo: EF 60-65%, nl wall motion, Gr 2 DD.  . Tobacco abuse   . Coronary vasospasm     a. presumed    ROS: Negative except as per HPI  BP 118/64  Pulse 69  Ht 5\' 5"  (1.651 m)  Wt 88.451 kg (195 lb)  BMI 32.45 kg/m2  PHYSICAL EXAM: Pt is alert and oriented, NAD HEENT: normal Neck: JVP - normal, carotids 2+= without bruits Lungs: CTA  bilaterally CV: RRR without murmur or gallop Abd: soft, NT, Positive BS, no hepatomegaly Ext: no C/C/E, distal pulses intact and equal Skin: warm/dry no rash  EKG:  Normal sinus rhythm 69 beats per minute, within normal limits.  ASSESSMENT AND PLAN: 1. Coronary artery disease, native vessel. The patient remained stable without anginal symptoms. She will continue on aspirin 81 mg indefinitely. I am going to see her back in 6 months and will discontinue her Plavix at that time. She otherwise will continue her current medical program.  2. Hypertension. Blood pressure controlled on a combination of amlodipine and enalapril.  3. Hyperlipidemia. She continues on atorvastatin 40 mg.  4. Tobacco abuse. 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  Tonny Bollman 05/09/2013 4:16 PM

## 2013-05-10 ENCOUNTER — Encounter: Payer: Self-pay | Admitting: Cardiovascular Disease

## 2013-05-14 ENCOUNTER — Other Ambulatory Visit: Payer: Self-pay | Admitting: Internal Medicine

## 2013-05-23 ENCOUNTER — Telehealth: Payer: Self-pay | Admitting: Internal Medicine

## 2013-05-23 ENCOUNTER — Telehealth: Payer: Self-pay

## 2013-05-23 MED ORDER — CLARITHROMYCIN 500 MG PO TABS: 500.0000 mg | ORAL_TABLET | Freq: Two times a day (BID) | ORAL | Status: DC

## 2013-05-23 NOTE — Telephone Encounter (Signed)
Patient reports having an URI. Feels achy, ears stopped up, low grade temp of around 100 degrees, and lots of yellow nasal drainage. Did not mention a cough.

## 2013-05-23 NOTE — Telephone Encounter (Signed)
Per Dr. Lenord Fellers, will call in Biaxin 500mg  as directed to [facility].

## 2013-05-23 NOTE — Telephone Encounter (Signed)
Will pick up today

## 2013-06-10 ENCOUNTER — Encounter: Payer: Self-pay | Admitting: Internal Medicine

## 2013-06-10 DIAGNOSIS — G4733 Obstructive sleep apnea (adult) (pediatric): Secondary | ICD-10-CM | POA: Insufficient documentation

## 2013-06-10 NOTE — Patient Instructions (Addendum)
Please stop smoking. Return in 6 months.

## 2013-06-10 NOTE — Progress Notes (Signed)
Subjective:    Patient ID: [name], female    DOB: Jul 18, 1966, 47 y.o.   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 She had a subtotal occlusion of a very small branch of the diagonal and no other significant disease. She did well postcatheterization.   Unfortunately continues to smoke a half pack cigarettes daily. She has history of hypertension, asthma, anxiety depression, GE reflux, remote history of at bedtime purpura, hypothyroidism and chronic musculoskeletal pain. She is overweight. Has been going to cardiac rehabilitation.   Working at [employer] History of serious case of HS purpura in October 2006. She developed itchy hands and arthralgias in her joints. Subsequently developed purpuric lesions on lower extremities and had maroon colored stools. Colonoscopy showed inflamed terminal  ileum. Hemoglobin dropped to 9.4 g. MRA performed of the celiac access proves be negative for ischemic bowel. She developed ascites and CT scan showed diffuse colitis. She was transferred to [facility] after developing tea-colored urine with hematuria. 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 Was treated with high-dose steroids which she remained on for several months. Was treated by Dr. Yates Decamp at [facility] in the rheumatology department. However, in September 2009 was hospitalized with acute gastroenteritis secondary to   Clostridium difficile.   History of sleep apnea. History of migraine headaches. History of cervical radiculopathy 2012. She had an osteophyte causing some mild foraminal stenosis and impingement on MRI and was seen by Dr. Venetia Maxon.  During hospitalization in 2006 had 2-D echocardiogram that was normal. In 2009 had a normal cardiac nuclear medicine study.  She is not on CPAP. Weight loss has been recommended for  sleep apnea.  Sleep study showed desaturation to 87% with snoring. Respiratory distress index of 10.5 per hour.  History of hypertension treated with Vasotec. History of hypothyroidism treated with levothyroxin. Anxiety depression treated with Pristiq. For a while was treated with Adderall for attention deficit disorder but I have discontinued that since her MI.  History of endometrial polyp causing menorrhagia removed by Dr. Thomasena Edis November 2003. Has had 3 C-sections 1995, 1996, 1999. Appendectomy in the early 1970s. No history of fractures.  Patient does not consume alcohol. Has smoked a half pack cigarettes daily for well over 20 years.  Family history: Both parents with hypertension. Father with history of stroke. 2 brothers in good health. No sisters.  SHX: Married with 3 sons. The eldest is graduating from high school this year.    Review of Systems  Constitutional: Positive for fatigue.  HENT: Negative.   Eyes: Negative.   Respiratory:       History of reactive airways with URIs  Cardiovascular: Negative.   Gastrointestinal:       Reflux  Endocrine:       Hypothyroidism  Genitourinary: Negative.   Allergic/Immunologic: Negative.   Neurological: Negative.   Hematological: Negative.   Psychiatric/Behavioral:       Anxiety and depression       Objective:   Physical Exam  Vitals reviewed. Constitutional: She is oriented to person, place, and time. She appears well-developed and well-nourished. No distress.  HENT:  Head: Normocephalic and atraumatic.  Right Ear: External ear normal.  Left Ear: External ear normal.  Mouth/Throat: Oropharynx is clear and moist. No oropharyngeal exudate.  Eyes: Conjunctivae are normal. Pupils are equal, round, and reactive to light. Right eye  exhibits no discharge. Left eye exhibits no discharge. No scleral icterus.  Neck: Neck supple. No JVD present. No thyromegaly present.  Cardiovascular: Normal rate, normal heart sounds and intact  distal pulses.   No murmur heard. Pulmonary/Chest: Breath sounds normal. No respiratory distress. She has no wheezes. She has no rales. She exhibits no tenderness.  Breasts normal female  Abdominal: Soft. Bowel sounds are normal. She exhibits no distension and no mass. There is no tenderness. There is no rebound and no guarding.  Genitourinary:  Last Pap 2011 was WNL  Musculoskeletal: Normal range of motion. She exhibits no edema and no tenderness.  Lymphadenopathy:    She has no cervical adenopathy.  Neurological: She is alert and oriented to person, place, and time. She has normal reflexes. No cranial nerve deficit. Coordination normal.  Skin: Skin is warm and dry. No rash noted. She is not diaphoretic.  Psychiatric: She has a normal mood and affect. Her behavior is normal. Judgment and thought content normal.          Assessment & Plan:  Coronary artery disease status post anterior MI December 2013. On statin therapy. Also on Plavix.  History of smoking-continues to smoke one half pack per day. Counseled once again regarding smoking cessation. Offered Chantix.  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 Considerable stress with family finances. Has 3 sons. Worried about her job. Not very happy in current position. Return in 6 months for six-month recheck and followup on TSH/smoking  issues/hypertension

## 2013-06-10 NOTE — Progress Notes (Signed)
  Subjective:    Patient ID: Liberty Handy, female    DOB: [birth year], 47 y.o.   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 Excell Seltzer. Patient has history of smoking well over 20 years half pack cigarettes per day. History of hypertension. History of hypothyroidism. Recently seen emergency department was viral syndrome on January 13. Here today for followup. Had potassium of 3.2 in the emergency department. Was treated with Tamiflu, Zofran and IV fluids. Had fever chills myalgias nausea and diarrhea. Feeling better now.   Review of Systems     Objective:   Physical Exam neck is supple without JVD thyromegaly or carotid bruits. Chest clear to auscultation. Cardiac exam regular rate and rhythm normal S1 and S2. Extremities without edema.        Assessment & Plan:  Anxiety status post MI. Worried about the future. Worried about her children.  Recent viral syndrome-resolving  History smoking-needs to quit  Obesity-needs to lose weight  Status post MI December 2013  Hypertension  Hypothyroidism  Plan: Schedule for physical examination May 2014. Continue with cardiac rehabilitation.

## 2013-06-10 NOTE — Patient Instructions (Addendum)
Continue with cardiac rehabilitation. Viral syndrome is resolving. Return May 2014 for physical exam.

## 2013-06-19 ENCOUNTER — Other Ambulatory Visit (HOSPITAL_COMMUNITY): Payer: Self-pay | Admitting: Nurse Practitioner

## 2013-06-20 ENCOUNTER — Other Ambulatory Visit (HOSPITAL_COMMUNITY): Payer: Self-pay | Admitting: Nurse Practitioner

## 2013-07-02 ENCOUNTER — Other Ambulatory Visit: Payer: Self-pay | Admitting: Internal Medicine

## 2013-07-06 ENCOUNTER — Other Ambulatory Visit (HOSPITAL_COMMUNITY): Payer: Self-pay | Admitting: Nurse Practitioner

## 2013-08-10 ENCOUNTER — Other Ambulatory Visit: Payer: Self-pay

## 2013-08-10 MED ORDER — HYDROCODONE-ACETAMINOPHEN 5-325 MG PO TABS: 1.0000 | ORAL_TABLET | Freq: Two times a day (BID) | ORAL | Status: DC | PRN

## 2013-08-30 ENCOUNTER — Other Ambulatory Visit: Payer: Self-pay

## 2013-08-30 ENCOUNTER — Other Ambulatory Visit (HOSPITAL_COMMUNITY): Payer: Self-pay | Admitting: Nurse Practitioner

## 2013-10-11 ENCOUNTER — Other Ambulatory Visit: Payer: Self-pay

## 2013-10-15 ENCOUNTER — Ambulatory Visit (INDEPENDENT_AMBULATORY_CARE_PROVIDER_SITE_OTHER): Payer: 59 | Admitting: Internal Medicine

## 2013-10-15 ENCOUNTER — Ambulatory Visit
Admission: RE | Admit: 2013-10-15 | Discharge: 2013-10-15 | Disposition: A | Discharge status: Home / Self Care | Payer: 59 | Source: Ambulatory Visit | Attending: Internal Medicine | Admitting: Internal Medicine

## 2013-10-15 ENCOUNTER — Encounter: Payer: Self-pay | Admitting: Internal Medicine

## 2013-10-15 VITALS — BP 112/78 | HR 72 | Temp 96.3°F | Ht 65.0 in | Wt 201.0 lb

## 2013-10-15 DIAGNOSIS — M79609 Pain in unspecified limb: Secondary | ICD-10-CM

## 2013-10-15 MED ORDER — HYDROCODONE-ACETAMINOPHEN 5-325 MG PO TABS: 1.0000 | ORAL_TABLET | Freq: Two times a day (BID) | ORAL | Status: DC | PRN

## 2013-10-15 MED ORDER — PANTOPRAZOLE SODIUM 40 MG PO TBEC: DELAYED_RELEASE_TABLET | ORAL | Status: DC

## 2013-10-15 MED ORDER — HYDROCODONE-ACETAMINOPHEN 10-325 MG PO TABS: 1.0000 | ORAL_TABLET | Freq: Three times a day (TID) | ORAL | Status: DC | PRN

## 2013-10-15 NOTE — Patient Instructions (Signed)
Please stop smoking. Return for fasting lab work in the near future. Otherwise return for physical exam in 6 months.

## 2013-10-15 NOTE — Progress Notes (Signed)
  Subjective:    Patient ID: [name], female    DOB: [birth year], 47 y.o.   MRN: [id number]  HPI In today for six-month recheck. She's disturbed cause her youngest child has a ninth grade student at [employer] high school is depressed. He is on Zoloft and  will not talk with her about what is wrong. He's not doing well in school. There are number of subjects he does not like. She's never seen him like this. This has been very stressful. They have always been very close. He is seeing a Veterinary surgeon. 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 Husband just returned to work after being out for shoulder surgery. Oldest son is at [employer] and middle son is a Holiday representative at [employer].  Unfortunately, patient continues to smoke half pack cigarettes daily. She tried Chantix but did not quit smoking. Stress as part of the problem. She is not fasting today. I will her to have fasting lab work including lipid panel liver functions, hemoglobin A1c, TSH and basic metabolic panel. She will have this in the near future this office.  She's also complaining of right lateral foot pain. Want her to have x-ray to see if she has a stress fracture.  She's complaining of low back pain which is frequent. Says it's worse today. I have refilled her Norco 5/325 for that.  Have refilled Protonix for year.    Review of Systems     Objective:   Physical Exam Neck is supple without JVD thyromegaly or carotid bruits. Chest clear to auscultation. Cardiac exam regular rate and rhythm. Extremities without edema.       Assessment & Plan:  Coronary artery disease-atherosclerosis  Hypertension-stable  Situational stress  History of smoking-have told patient to stop smoking.  Hypothyroidism-to have TSH checked  Low back pain-Norco refilled  Anxiety depression  GE reflux-Protonix refilled  History of sleep apnea  History of asthma  History of HS purpura  Plan: Return in 6 months. Is  to see Dr. Excell Seltzer, cardiologist December 2014. Will have lab work before she sees him.  25 minutes spent with patient talking about these issues

## 2013-10-16 ENCOUNTER — Other Ambulatory Visit: Payer: Self-pay

## 2013-10-16 DIAGNOSIS — Z1231 Encounter for screening mammogram for malignant neoplasm of breast: Secondary | ICD-10-CM

## 2013-10-19 ENCOUNTER — Other Ambulatory Visit: Payer: 59 | Admitting: Internal Medicine

## 2013-10-19 DIAGNOSIS — E039 Hypothyroidism, unspecified: Secondary | ICD-10-CM

## 2013-10-19 DIAGNOSIS — E785 Hyperlipidemia, unspecified: Secondary | ICD-10-CM

## 2013-10-19 DIAGNOSIS — I2510 Atherosclerotic heart disease of native coronary artery without angina pectoris: Secondary | ICD-10-CM

## 2013-10-19 DIAGNOSIS — Z79899 Other long term (current) drug therapy: Secondary | ICD-10-CM

## 2013-10-19 DIAGNOSIS — Z131 Encounter for screening for diabetes mellitus: Secondary | ICD-10-CM

## 2013-10-19 DIAGNOSIS — I10 Essential (primary) hypertension: Secondary | ICD-10-CM

## 2013-10-19 LAB — HEPATIC FUNCTION PANEL
ALT: 28 U/L (ref 0–35)
AST: 29 U/L (ref 0–37)
Albumin: 4 g/dL (ref 3.5–5.2)
Alkaline Phosphatase: 78 U/L (ref 39–117)
Bilirubin, Direct: 0.1 mg/dL (ref 0.0–0.3)
Indirect Bilirubin: 0.3 mg/dL (ref 0.0–0.9)
Total Bilirubin: 0.4 mg/dL (ref 0.3–1.2)
Total Protein: 6.2 g/dL (ref 6.0–8.3)

## 2013-10-19 LAB — HEMOGLOBIN A1C
Hgb A1c MFr Bld: 5.9 % — ABNORMAL HIGH (ref <5.7)
Mean Plasma Glucose: 123 mg/dL — ABNORMAL HIGH (ref <117)

## 2013-10-19 LAB — LIPID PANEL
Cholesterol: 112 mg/dL (ref 0–200)
HDL: 49 mg/dL (ref >39)
LDL Cholesterol: 46 mg/dL (ref 0–99)
Total CHOL/HDL Ratio: 2.3 Ratio
Triglycerides: 83 mg/dL (ref <150)
VLDL: 17 mg/dL (ref 0–40)

## 2013-10-20 LAB — TSH: TSH: 1.766 u[IU]/mL (ref 0.350–4.500)

## 2013-11-20 ENCOUNTER — Ambulatory Visit
Admission: RE | Admit: 2013-11-20 | Discharge: 2013-11-20 | Disposition: A | Discharge status: Home / Self Care | Payer: 59 | Source: Ambulatory Visit

## 2013-11-20 DIAGNOSIS — Z1231 Encounter for screening mammogram for malignant neoplasm of breast: Secondary | ICD-10-CM

## 2013-12-14 ENCOUNTER — Encounter: Payer: Self-pay | Admitting: Cardiovascular Disease

## 2013-12-14 ENCOUNTER — Ambulatory Visit (INDEPENDENT_AMBULATORY_CARE_PROVIDER_SITE_OTHER): Payer: 59 | Admitting: Cardiovascular Disease

## 2013-12-14 VITALS — BP 151/99 | HR 80 | Ht 65.0 in | Wt 201.0 lb

## 2013-12-14 DIAGNOSIS — I2510 Atherosclerotic heart disease of native coronary artery without angina pectoris: Secondary | ICD-10-CM

## 2013-12-14 DIAGNOSIS — I10 Essential (primary) hypertension: Secondary | ICD-10-CM

## 2013-12-14 NOTE — Patient Instructions (Signed)
Your physician has recommended you make the following change in your medication:  STOP Plavix Continue all other medications as directed  Your physician wants you to follow-up in: 6 months with Dr. Burt Knack.  You will receive a reminder letter in the mail two months in advance. If you don't receive a letter, please call our office to schedule the follow-up appointment.  Your physician has requested that you regularly monitor and record your blood pressure readings at home. Please use the same machine at the same time of day to check your readings and record them.  Please call Christen Bame, RN [phone]) to report these readings.

## 2013-12-14 NOTE — Progress Notes (Signed)
HPI:   48 year old woman presenting for followup evaluation. She is followed for coronary artery disease. She presented in December 2013 with an acute anterior wall MI. Cardiac catheterization demonstrated patency of the LAD with subtotal occlusion of a very small diagonal branch. She had a significant cardiac enzyme rise, but her LV function was within normal limits. Presumably this was a vasospastic event. She returns today for followup.  Last lipids from November 2014 showed a total cholesterol of 112, triglycerides 83, HDL 49, and LDL 46.  She's had a difficult time since I have seen her last. She's had a lot of home related stress. She's continued to smoke and has gained weight since her last visit.  She's had no chest pain or shortness of breath. She denies edema or palpitations. She's been compliant with her medications. She's frustrated about inability to stop smoking. She tried Chantix in the past but was unsuccessful.   Outpatient Encounter Prescriptions as of 12/14/2013  Medication Sig  . ALPRAZolam (XANAX) 0.25 MG tablet Take 0.125 mg by mouth daily as needed. States she takes maybe once or twice a week  . amLODipine (NORVASC) 5 MG tablet TAKE 1 TABLET BY MOUTH ONCE DAILY  . aspirin EC 81 MG EC tablet Take 1 tablet (81 mg total) by mouth daily.  Marland Kitchen atorvastatin (LIPITOR) 40 MG tablet TAKE 1 TABLET BY MOUTH ONCE DAILY AT 6 PM  . calcium-vitamin D (OSCAL WITH D) 500-200 MG-UNIT per tablet Take 1 tablet by mouth daily.  . clopidogrel (PLAVIX) 75 MG tablet TAKE 1 TABLET BY MOUTH ONCE DAILY WITH BREAKFAST  . enalapril (VASOTEC) 5 MG tablet Take 1 tablet (5 mg total) by mouth daily.  Marland Kitchen HYDROcodone-acetaminophen (NORCO/VICODIN) 5-325 MG per tablet Take 1 tablet by mouth 2 (two) times daily as needed.  Marland Kitchen levothyroxine (SYNTHROID, LEVOTHROID) 75 MCG tablet TAKE 1 TABLET BY MOUTH DAILY  . nitroGLYCERIN (NITROSTAT) 0.4 MG SL tablet Place 1 tablet (0.4 mg total) under the tongue every 5  (five) minutes as needed for chest pain.  Marland Kitchen ondansetron (ZOFRAN ODT) 4 MG disintegrating tablet Take 1 tablet (4 mg total) by mouth every 8 (eight) hours as needed for nausea.  . pantoprazole (PROTONIX) 40 MG tablet TAKE 1 TABLET BY MOUTH TWICE DAILY BEFORE MEALS  . PRISTIQ 50 MG 24 hr tablet TAKE 1 TABLET BY MOUTH ONCE DAILY  . [DISCONTINUED] CHANTIX STARTING MONTH PAK 0.5 MG X 11 & 1 MG X 42 tablet TAKE AS DIRECTED  . [DISCONTINUED] clarithromycin (BIAXIN) 500 MG tablet Take 1 tablet (500 mg total) by mouth 2 (two) times daily.  . [DISCONTINUED] HYDROcodone-acetaminophen (NORCO) 10-325 MG per tablet Take 1 tablet by mouth every 8 (eight) hours as needed.    Allergies  Allergen Reactions  . Levaquin [Levofloxacin] Hives  . Nsaids Other (See Comments)    Able to take aspirin "Had HSP; since then they treat my kidneys very nicely" (11/22/2012)    Past Medical History  Diagnosis Date  . Hypertension     Controlled - she states she is on BP meds for HSP  . Anxiety   . Hypothyroidism   . GERD (gastroesophageal reflux disease)   . ADHD (attention deficit hyperactivity disorder)   . 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 GI bleed     2006: secondary to leukoclastic vasculitis  per chart  . Henoch-Schonlein purpura     HSP tx at [facility]  in 2006 (also h/o leukocytoclastic vasculitis per chart 2006)  . Asthma   . Sleep apnea     "lost 50#; no problems w/it now" (11/22/2012)  . Migraines   . Depression   . Chronic kidney disease     in setting of HSP 2006  . CAD (coronary artery disease)     a. 11/2012 Acute Anterior STEMI/Cath: Nonobs dzs except subtotal occlusion of small, 4mm D2, EF 55-65%;  b. 11/2012  Echo: EF 60-65%, nl wall motion, Gr 2 DD.  . Tobacco abuse   . Coronary vasospasm     a. presumed    ROS: Negative except as per HPI  BP 151/99  Pulse 80  Ht 5\' 5"  (1.651 m)  Wt 201 lb (91.173 kg)  BMI 33.45  kg/m2  PHYSICAL EXAM: Pt is alert and oriented, NAD HEENT: normal Neck: JVP - normal, carotids 2+= without bruits Lungs: CTA bilaterally CV: RRR without murmur or gallop Abd: soft, NT, Positive BS, no hepatomegaly Ext: no C/C/E, distal pulses intact and equal Skin: warm/dry no rash  EKG:  Normal sinus rhythm with sinus arrhythmia, 73 beats per minute, rightward axis, low voltage QRS.  ASSESSMENT AND PLAN: 1. Coronary artery disease, native vessel. The patient is stable without symptoms of angina. We of course discussed smoking cessation and weight loss strategies. She is greater than one year out from her MI and I think she should stop Plavix based on guideline recommendations. She should remain on lifelong aspirin 81 mg.  2. Hypertension with suboptimal control. She's had a cold recently and attributes stress today plus a recent cold to high blood pressure in the office. Her blood pressure has typically been well controlled. She will continue on amlodipine and enalapril. I asked her to monitor her blood pressure carefully over the next several weeks and to call if readings are greater than 140/90. 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  3. Hyperlipidemia. She remains on atorvastatin. Lipids were reviewed. They are at goal with an LDL of 46.  Sherren Mocha 12/16/2013 4:30 PM

## 2013-12-20 ENCOUNTER — Other Ambulatory Visit: Payer: Self-pay

## 2013-12-20 MED ORDER — AMLODIPINE BESYLATE 5 MG PO TABS: ORAL_TABLET | ORAL | Status: DC

## 2013-12-20 MED ORDER — ATORVASTATIN CALCIUM 40 MG PO TABS: ORAL_TABLET | ORAL | Status: DC

## 2014-01-11 ENCOUNTER — Telehealth: Payer: Self-pay | Admitting: Internal Medicine

## 2014-01-11 ENCOUNTER — Other Ambulatory Visit: Payer: Self-pay

## 2014-01-11 MED ORDER — HYDROCODONE-ACETAMINOPHEN 5-325 MG PO TABS: 1.0000 | ORAL_TABLET | Freq: Two times a day (BID) | ORAL | Status: DC | PRN

## 2014-01-11 NOTE — Telephone Encounter (Signed)
Please refill x 90 days 

## 2014-01-15 ENCOUNTER — Telehealth: Payer: Self-pay | Admitting: Internal Medicine

## 2014-01-15 NOTE — Telephone Encounter (Signed)
Pt requests FMLA form to be completed for Attention Deficit issues. Form completed as requested. No longer on ADD meds due to cardiac condition.

## 2014-02-13 ENCOUNTER — Encounter (HOSPITAL_COMMUNITY): Payer: Self-pay | Admitting: Emergency Medicine

## 2014-02-13 ENCOUNTER — Emergency Department (HOSPITAL_COMMUNITY)
Admission: EM | Admit: 2014-02-13 | Discharge: 2014-02-13 | Disposition: A | Discharge status: Home / Self Care | Payer: 59 | Attending: Emergency Medicine | Admitting: Emergency Medicine

## 2014-02-13 ENCOUNTER — Telehealth: Payer: Self-pay | Admitting: Internal Medicine

## 2014-02-13 DIAGNOSIS — J45909 Unspecified asthma, uncomplicated: Secondary | ICD-10-CM | POA: Insufficient documentation

## 2014-02-13 DIAGNOSIS — Z862 Personal history of diseases of the blood and blood-forming organs and certain disorders involving the immune mechanism: Secondary | ICD-10-CM | POA: Insufficient documentation

## 2014-02-13 DIAGNOSIS — F3289 Other specified depressive episodes: Secondary | ICD-10-CM | POA: Insufficient documentation

## 2014-02-13 DIAGNOSIS — K219 Gastro-esophageal reflux disease without esophagitis: Secondary | ICD-10-CM | POA: Insufficient documentation

## 2014-02-13 DIAGNOSIS — N189 Chronic kidney disease, unspecified: Secondary | ICD-10-CM | POA: Insufficient documentation

## 2014-02-13 DIAGNOSIS — Z9089 Acquired absence of other organs: Secondary | ICD-10-CM | POA: Insufficient documentation

## 2014-02-13 DIAGNOSIS — R42 Dizziness and giddiness: Secondary | ICD-10-CM | POA: Insufficient documentation

## 2014-02-13 DIAGNOSIS — Z3202 Encounter for pregnancy test, result negative: Secondary | ICD-10-CM | POA: Insufficient documentation

## 2014-02-13 DIAGNOSIS — F329 Major depressive disorder, single episode, unspecified: Secondary | ICD-10-CM | POA: Insufficient documentation

## 2014-02-13 DIAGNOSIS — Z7982 Long term (current) use of aspirin: Secondary | ICD-10-CM | POA: Insufficient documentation

## 2014-02-13 DIAGNOSIS — F909 Attention-deficit hyperactivity disorder, unspecified type: Secondary | ICD-10-CM | POA: Insufficient documentation

## 2014-02-13 DIAGNOSIS — F17200 Nicotine dependence, unspecified, uncomplicated: Secondary | ICD-10-CM | POA: Insufficient documentation

## 2014-02-13 DIAGNOSIS — F411 Generalized anxiety disorder: Secondary | ICD-10-CM | POA: Insufficient documentation

## 2014-02-13 DIAGNOSIS — R109 Unspecified abdominal pain: Secondary | ICD-10-CM | POA: Insufficient documentation

## 2014-02-13 DIAGNOSIS — Z8639 Personal history of other endocrine, nutritional and metabolic disease: Secondary | ICD-10-CM | POA: Insufficient documentation

## 2014-02-13 DIAGNOSIS — I2510 Atherosclerotic heart disease of native coronary artery without angina pectoris: Secondary | ICD-10-CM | POA: Insufficient documentation

## 2014-02-13 DIAGNOSIS — R197 Diarrhea, unspecified: Secondary | ICD-10-CM | POA: Insufficient documentation

## 2014-02-13 DIAGNOSIS — Z9889 Other specified postprocedural states: Secondary | ICD-10-CM | POA: Insufficient documentation

## 2014-02-13 DIAGNOSIS — I129 Hypertensive chronic kidney disease with stage 1 through stage 4 chronic kidney disease, or unspecified chronic kidney disease: Secondary | ICD-10-CM | POA: Insufficient documentation

## 2014-02-13 DIAGNOSIS — R000 Tachycardia, unspecified: Secondary | ICD-10-CM | POA: Insufficient documentation

## 2014-02-13 DIAGNOSIS — Z79899 Other long term (current) drug therapy: Secondary | ICD-10-CM | POA: Insufficient documentation

## 2014-02-13 DIAGNOSIS — R1110 Vomiting, unspecified: Secondary | ICD-10-CM | POA: Insufficient documentation

## 2014-02-13 LAB — CBC WITH DIFFERENTIAL/PLATELET
Basophils Absolute: 0 10*3/uL (ref 0.0–0.1)
Basophils Relative: 0 % (ref 0–1)
Eosinophils Absolute: 0 10*3/uL (ref 0.0–0.7)
Eosinophils Relative: 0 % (ref 0–5)
HCT: 47.2 % — ABNORMAL HIGH (ref 36.0–46.0)
Hemoglobin: 16.5 g/dL — ABNORMAL HIGH (ref 12.0–15.0)
Lymphocytes Relative: 3 % — ABNORMAL LOW (ref 12–46)
Lymphs Abs: 0.3 10*3/uL — ABNORMAL LOW (ref 0.7–4.0)
MCH: 32.3 pg (ref 26.0–34.0)
MCHC: 35 g/dL (ref 30.0–36.0)
MCV: 92.4 fL (ref 78.0–100.0)
Monocytes Absolute: 0.3 10*3/uL (ref 0.1–1.0)
Monocytes Relative: 3 % (ref 3–12)
Neutro Abs: 9.3 10*3/uL — ABNORMAL HIGH (ref 1.7–7.7)
Neutrophils Relative %: 93 % — ABNORMAL HIGH (ref 43–77)
Platelets: 160 10*3/uL (ref 150–400)
RBC: 5.11 MIL/uL (ref 3.87–5.11)
RDW: 12.2 % (ref 11.5–15.5)
WBC: 9.9 10*3/uL (ref 4.0–10.5)

## 2014-02-13 LAB — COMPREHENSIVE METABOLIC PANEL
ALT: 31 U/L (ref 0–35)
AST: 26 U/L (ref 0–37)
Albumin: 3.8 g/dL (ref 3.5–5.2)
Alkaline Phosphatase: 91 U/L (ref 39–117)
BUN: 21 mg/dL (ref 6–23)
CO2: 26 mEq/L (ref 19–32)
Calcium: 8.8 mg/dL (ref 8.4–10.5)
Chloride: 99 mEq/L (ref 96–112)
Creatinine, Ser: 0.71 mg/dL (ref 0.50–1.10)
GFR calc Af Amer: >90 mL/min (ref >90)
GFR calc non Af Amer: >90 mL/min (ref >90)
Glucose, Bld: 126 mg/dL — ABNORMAL HIGH (ref 70–99)
Potassium: 3.4 mEq/L — ABNORMAL LOW (ref 3.7–5.3)
Sodium: 140 mEq/L (ref 137–147)
Total Bilirubin: 0.5 mg/dL (ref 0.3–1.2)
Total Protein: 7 g/dL (ref 6.0–8.3)

## 2014-02-13 LAB — URINALYSIS, ROUTINE W REFLEX MICROSCOPIC
Glucose, UA: NEGATIVE mg/dL
Hgb urine dipstick: NEGATIVE
Ketones, ur: 15 mg/dL — AB
Nitrite: NEGATIVE
Protein, ur: 100 mg/dL — AB
Specific Gravity, Urine: 1.037 — ABNORMAL HIGH (ref 1.005–1.030)
Urobilinogen, UA: 1 mg/dL (ref 0.0–1.0)
pH: 6 (ref 5.0–8.0)

## 2014-02-13 LAB — URINE MICROSCOPIC-ADD ON

## 2014-02-13 LAB — I-STAT TROPONIN, ED: Troponin i, poc: 0 ng/mL (ref 0.00–0.08)

## 2014-02-13 LAB — PREGNANCY, URINE: Preg Test, Ur: NEGATIVE

## 2014-02-13 LAB — LIPASE, BLOOD: Lipase: 19 U/L (ref 11–59)

## 2014-02-13 MED ORDER — ONDANSETRON 4 MG PO TBDP
8.0000 mg | ORAL_TABLET | Freq: Once | ORAL | Status: AC
  Administered 2014-02-13: 8 mg via ORAL
  Filled 2014-02-13: qty 2

## 2014-02-13 MED ORDER — SODIUM CHLORIDE 0.9 % IV BOLUS (SEPSIS)
1000.0000 mL | Freq: Once | INTRAVENOUS | Status: AC
  Administered 2014-02-13: 1000 mL via INTRAVENOUS

## 2014-02-13 MED ORDER — FENTANYL CITRATE 0.05 MG/ML IJ SOLN
50.0000 ug | Freq: Once | INTRAMUSCULAR | Status: AC
  Administered 2014-02-13: 50 ug via INTRAVENOUS
  Filled 2014-02-13: qty 2

## 2014-02-13 MED ORDER — ACETAMINOPHEN 325 MG PO TABS
650.0000 mg | ORAL_TABLET | Freq: Once | ORAL | Status: AC
  Administered 2014-02-13: 650 mg via ORAL
  Filled 2014-02-13: qty 2

## 2014-02-13 MED ORDER — ONDANSETRON HCL 4 MG/2ML IJ SOLN
4.0000 mg | Freq: Once | INTRAMUSCULAR | Status: AC
  Administered 2014-02-13: 4 mg via INTRAVENOUS
  Filled 2014-02-13: qty 2

## 2014-02-13 MED ORDER — ONDANSETRON 4 MG PO TBDP: ORAL_TABLET | ORAL | Status: DC

## 2014-02-13 NOTE — Telephone Encounter (Signed)
Noted by text from Dr. Renold Genta.

## 2014-02-13 NOTE — ED Notes (Signed)
PT states HA is now her main pain she thinks from the vomiting

## 2014-02-13 NOTE — ED Notes (Signed)
PT reports n/v since 0500 today and diarrhea x1 this afternoon. PT states central epigastric pain and reports vomiting does not relieve the pain. Nausea intermittent but PT reports HA and back aching now

## 2014-02-13 NOTE — ED Provider Notes (Signed)
CSN: [id number]     Arrival date & time 02/13/14  1427 History   First MD Initiated Contact with Patient 02/13/14 1819     Chief Complaint  Patient presents with  . Abdominal Pain     (Consider location/radiation/quality/duration/timing/severity/associated sxs/prior Treatment) HPI Comments: 48 yo female with HSB, HTN, thyroid, smoking, MI from vasospasm, sleep apnea presents with diffuse cramping abdo pain, vomiting times 4, diarrhea once since early this am.  Worse than normal GE in past.  Low grade fever.  Unable to tolerate po.  Non radiating, non focal.  Appendix removed in the past, no bleeding. No gb or kidney stone hx. Last meal cheese meal last night. No one else sick.  Patient is a 48 y.o. female presenting with abdominal pain. The history is provided by the patient.  Abdominal Pain Associated symptoms: diarrhea, fever and vomiting   Associated symptoms: no chest pain, no chills, no dysuria and no shortness of breath     Past Medical History  Diagnosis Date  . Hypertension     Controlled - she states she is on BP meds for HSP  . Anxiety   . Hypothyroidism   . GERD (gastroesophageal reflux disease)   . ADHD (attention deficit hyperactivity disorder)   . 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 GI bleed     2006: secondary to leukoclastic vasculitis  per chart  . Henoch-Schonlein purpura     HSP tx at [facility] in 2006 (also h/o leukocytoclastic vasculitis per chart 2006)  . Asthma   . Sleep apnea     "lost 50#; no problems w/it now" (11/22/2012)  . Migraines   . Depression   . Chronic kidney disease     in setting of HSP 2006  . CAD (coronary artery disease)     a. 11/2012 Acute Anterior STEMI/Cath: Nonobs dzs except subtotal occlusion of small, 14mm D2, EF 55-65%;  b. 11/2012  Echo: EF 60-65%, nl wall motion, Gr 2 DD.  . Tobacco abuse   . Coronary vasospasm     a. presumed   Past Surgical History   Procedure Laterality Date  . Appendectomy  1970's  . Cesarean section  1995; 1996; 1999  . Dilation and curettage of uterus  1990's  . Cardiac catheterization  11/22/2012    "first one" (11/22/2012)   Family History  Problem Relation Age of Onset  . Stroke Father   . Heart disease Neg Hx   . Hypertension Mother    History  Substance Use Topics  . Smoking status: Current Every Day Smoker -- 0.50 packs/day for 25 years    Types: Cigarettes  . Smokeless tobacco: Never Used  . Alcohol Use: No   OB History   Grav Para Term Preterm Abortions TAB SAB Ect Mult Living                 Review of Systems  Constitutional: Positive for fever and appetite change. Negative for chills.  HENT: Negative for congestion.   Eyes: Negative for visual disturbance.  Respiratory: Negative for shortness of breath.   Cardiovascular: Negative for chest pain.  Gastrointestinal: Positive for vomiting, abdominal pain and diarrhea.  Genitourinary: Negative for dysuria and flank pain.  Musculoskeletal: Negative for back pain, neck pain and neck stiffness.  Skin: Negative for rash.  Neurological: Positive for light-headedness. Negative for headaches.      Allergies  Levaquin and Nsaids  Home Medications  Current Outpatient Rx  Name  Route  Sig  Dispense  Refill  . ALPRAZolam (XANAX) 0.25 MG tablet   Oral   Take 0.125 mg by mouth daily as needed. States she takes maybe once or twice a week         . amLODipine (NORVASC) 5 MG tablet   Oral   Take 5 mg by mouth daily.         Marland Kitchen aspirin EC 81 MG EC tablet   Oral   Take 1 tablet (81 mg total) by mouth daily.         Marland Kitchen atorvastatin (LIPITOR) 40 MG tablet   Oral   Take 40 mg by mouth daily.         . calcium-vitamin D (OSCAL WITH D) 500-200 MG-UNIT per tablet   Oral   Take 1 tablet by mouth daily.         Marland Kitchen desvenlafaxine (PRISTIQ) 50 MG 24 hr tablet   Oral   Take 50 mg by mouth daily.         . enalapril (VASOTEC) 5 MG  tablet   Oral   Take 1 tablet (5 mg total) by mouth daily.   30 tablet   6   . HYDROcodone-acetaminophen (NORCO/VICODIN) 5-325 MG per tablet   Oral   Take 1 tablet by mouth 2 (two) times daily as needed.   60 tablet   0     Fill on or after 03/11/2014   . nitroGLYCERIN (NITROSTAT) 0.4 MG SL tablet   Sublingual   Place 1 tablet (0.4 mg total) under the tongue every 5 (five) minutes as needed for chest pain.   25 tablet   3   . pantoprazole (PROTONIX) 40 MG tablet   Oral   Take 40 mg by mouth daily.         Marland Kitchen PRISTIQ 50 MG 24 hr tablet      TAKE 1 TABLET BY MOUTH ONCE DAILY   90 tablet   PRN     Dispense as written.    BP 113/61  Pulse 85  Temp(Src) 100.9 F (38.3 C)  Resp 18  SpO2 95% Physical Exam  Nursing note and vitals reviewed. Constitutional: She is oriented to person, place, and time. She appears well-developed and well-nourished.  HENT:  Head: Normocephalic and atraumatic.  Dry mm  Eyes: Conjunctivae are normal. Right eye exhibits no discharge. Left eye exhibits no discharge.  Neck: Normal range of motion. Neck supple. No tracheal deviation present.  Cardiovascular: Regular rhythm.  Tachycardia present.   Pulmonary/Chest: Effort normal and breath sounds normal.  Abdominal: Soft. She exhibits no distension. There is tenderness (diffuse, mild). There is no guarding.  Musculoskeletal: She exhibits no edema.  Neurological: She is alert and oriented to person, place, and time.  Skin: Skin is warm. No rash noted.  Psychiatric: She has a normal mood and affect.    ED Course  Procedures (including critical care time) Labs Review Labs Reviewed  CBC WITH DIFFERENTIAL - Abnormal; Notable for the following:    Hemoglobin 16.5 (*)    HCT 47.2 (*)    Neutrophils Relative % 93 (*)    Neutro Abs 9.3 (*)    Lymphocytes Relative 3 (*)    Lymphs Abs 0.3 (*)    All other components within normal limits  COMPREHENSIVE METABOLIC PANEL - Abnormal; Notable for the  following:    Potassium 3.4 (*)    Glucose, Bld 126 (*)  All other components within normal limits  URINALYSIS, ROUTINE W REFLEX MICROSCOPIC - Abnormal; Notable for the following:    Color, Urine AMBER (*)    APPearance CLOUDY (*)    Specific Gravity, Urine 1.037 (*)    Bilirubin Urine SMALL (*)    Ketones, ur 15 (*)    Protein, ur 100 (*)    Leukocytes, UA SMALL (*)    All other components within normal limits  URINE MICROSCOPIC-ADD ON - Abnormal; Notable for the following:    Bacteria, UA FEW (*)    All other components within normal limits  LIPASE, BLOOD  PREGNANCY, URINE  I-STAT TROPOININ, ED   Imaging Review No results found.   EKG Interpretation   Date/Time:  Wednesday February 13 2014 15:09:02 EDT Ventricular Rate:  86 PR Interval:  150 QRS Duration: 96 QT Interval:  362 QTC Calculation: 433 R Axis:   78 Text Interpretation:  Normal sinus rhythm Right atrial enlargement  Nonspecific ST abnormality Abnormal ECG Confirmed by Latanza Pfefferkorn  MD, Zareena Willis  ([id number]) on 02/13/2014 6:42:57 PM      MDM   Final diagnoses:  Abdominal cramping  Vomiting  Diarrhea   Concern for GE vs Colitis vs GB vs other.   Non toxic appearing, non focal.  IV fluids, pain and nausea meds. CT to look for GB vs Colitis.  Fluids for dehydration, IV. Pain and nausea medicines.  Pt sxs resolved on recheck, feels well.   CT cancelled, pt understands reasons to return.  Results and differential diagnosis were discussed with the patient. Close follow up outpatient was discussed, patient comfortable with the plan.        Mariea Clonts, MD 02/16/14 [phone]

## 2014-02-13 NOTE — ED Notes (Signed)
MD at bedside.EDP

## 2014-02-13 NOTE — Discharge Instructions (Signed)
If you were given medicines take as directed.  If you are on coumadin or contraceptives realize their levels and effectiveness is altered by many different medicines.  If you have any reaction (rash, tongues swelling, other) to the medicines stop taking and see a physician.   °Please follow up as directed and return to the ER or see a physician for new or worsening symptoms.  Thank you. ° ° °

## 2014-02-13 NOTE — ED Notes (Signed)
Pt c/o generalized abd pain, diarrhea and n/v. Vomited x 4 today. Pt sts she feel dehydrated. Also c/o generalized body aches. Nad, skin warm and dry, resp e/u.

## 2014-04-11 ENCOUNTER — Other Ambulatory Visit: Payer: Self-pay | Admitting: Internal Medicine

## 2014-04-16 ENCOUNTER — Other Ambulatory Visit: Payer: 59 | Admitting: Internal Medicine

## 2014-04-16 ENCOUNTER — Other Ambulatory Visit: Payer: Self-pay | Admitting: Internal Medicine

## 2014-04-16 DIAGNOSIS — Z130 Encounter for screening for diseases of the blood and blood-forming organs and certain disorders involving the immune mechanism: Secondary | ICD-10-CM

## 2014-04-16 DIAGNOSIS — Z1329 Encounter for screening for other suspected endocrine disorder: Secondary | ICD-10-CM

## 2014-04-16 DIAGNOSIS — E039 Hypothyroidism, unspecified: Secondary | ICD-10-CM

## 2014-04-16 DIAGNOSIS — Z13228 Encounter for screening for other metabolic disorders: Secondary | ICD-10-CM

## 2014-04-16 DIAGNOSIS — Z79899 Other long term (current) drug therapy: Secondary | ICD-10-CM

## 2014-04-16 DIAGNOSIS — I10 Essential (primary) hypertension: Secondary | ICD-10-CM

## 2014-04-16 DIAGNOSIS — Z13220 Encounter for screening for lipoid disorders: Secondary | ICD-10-CM

## 2014-04-16 LAB — CBC WITH DIFFERENTIAL/PLATELET
Basophils Absolute: 0 10*3/uL (ref 0.0–0.1)
Basophils Relative: 0 % (ref 0–1)
Eosinophils Absolute: 0.2 10*3/uL (ref 0.0–0.7)
Eosinophils Relative: 2 % (ref 0–5)
HCT: 43.4 % (ref 36.0–46.0)
Hemoglobin: 15 g/dL (ref 12.0–15.0)
Lymphocytes Relative: 28 % (ref 12–46)
Lymphs Abs: 2.3 10*3/uL (ref 0.7–4.0)
MCH: 31.4 pg (ref 26.0–34.0)
MCHC: 34.6 g/dL (ref 30.0–36.0)
MCV: 91 fL (ref 78.0–100.0)
Monocytes Absolute: 0.6 10*3/uL (ref 0.1–1.0)
Monocytes Relative: 7 % (ref 3–12)
Neutro Abs: 5.1 10*3/uL (ref 1.7–7.7)
Neutrophils Relative %: 63 % (ref 43–77)
Platelets: 188 10*3/uL (ref 150–400)
RBC: 4.77 MIL/uL (ref 3.87–5.11)
RDW: 13.1 % (ref 11.5–15.5)
WBC: 8.1 10*3/uL (ref 4.0–10.5)

## 2014-04-16 LAB — COMPREHENSIVE METABOLIC PANEL
ALT: 33 U/L (ref 0–35)
AST: 27 U/L (ref 0–37)
Albumin: 4.1 g/dL (ref 3.5–5.2)
Alkaline Phosphatase: 81 U/L (ref 39–117)
BUN: 8 mg/dL (ref 6–23)
CO2: 29 mEq/L (ref 19–32)
Calcium: 9.3 mg/dL (ref 8.4–10.5)
Chloride: 103 mEq/L (ref 96–112)
Creat: 0.79 mg/dL (ref 0.50–1.10)
Glucose, Bld: 104 mg/dL — ABNORMAL HIGH (ref 70–99)
Potassium: 4.6 mEq/L (ref 3.5–5.3)
Sodium: 141 mEq/L (ref 135–145)
Total Bilirubin: 0.6 mg/dL (ref 0.2–1.2)
Total Protein: 6.2 g/dL (ref 6.0–8.3)

## 2014-04-16 LAB — LIPID PANEL
Cholesterol: 133 mg/dL (ref 0–200)
HDL: 54 mg/dL (ref >39)
LDL Cholesterol: 62 mg/dL (ref 0–99)
Total CHOL/HDL Ratio: 2.5 Ratio
Triglycerides: 87 mg/dL (ref <150)
VLDL: 17 mg/dL (ref 0–40)

## 2014-04-17 LAB — VITAMIN D 25 HYDROXY (VIT D DEFICIENCY, FRACTURES): Vit D, 25-Hydroxy: 42 ng/mL (ref 30–89)

## 2014-04-17 LAB — TSH: TSH: 1.285 u[IU]/mL (ref 0.350–4.500)

## 2014-04-18 ENCOUNTER — Ambulatory Visit (INDEPENDENT_AMBULATORY_CARE_PROVIDER_SITE_OTHER): Payer: 59 | Admitting: Internal Medicine

## 2014-04-18 VITALS — BP 134/92 | HR 72 | Temp 98.4°F | Ht 65.5 in | Wt 204.0 lb

## 2014-04-18 DIAGNOSIS — F32A Depression, unspecified: Secondary | ICD-10-CM

## 2014-04-18 DIAGNOSIS — K219 Gastro-esophageal reflux disease without esophagitis: Secondary | ICD-10-CM

## 2014-04-18 DIAGNOSIS — E669 Obesity, unspecified: Secondary | ICD-10-CM

## 2014-04-18 DIAGNOSIS — F329 Major depressive disorder, single episode, unspecified: Secondary | ICD-10-CM

## 2014-04-18 DIAGNOSIS — F341 Dysthymic disorder: Secondary | ICD-10-CM

## 2014-04-18 DIAGNOSIS — Z0000 Encounter for general adult medical examination without abnormal findings: Secondary | ICD-10-CM

## 2014-04-18 DIAGNOSIS — F419 Anxiety disorder, unspecified: Secondary | ICD-10-CM

## 2014-04-18 DIAGNOSIS — E039 Hypothyroidism, unspecified: Secondary | ICD-10-CM

## 2014-04-18 DIAGNOSIS — IMO0001 Reserved for inherently not codable concepts without codable children: Secondary | ICD-10-CM

## 2014-04-18 DIAGNOSIS — Z8669 Personal history of other diseases of the nervous system and sense organs: Secondary | ICD-10-CM

## 2014-04-18 DIAGNOSIS — Z87898 Personal history of other specified conditions: Secondary | ICD-10-CM

## 2014-04-18 DIAGNOSIS — I10 Essential (primary) hypertension: Secondary | ICD-10-CM

## 2014-04-18 DIAGNOSIS — M7918 Myalgia, other site: Secondary | ICD-10-CM

## 2014-04-18 DIAGNOSIS — Z87891 Personal history of nicotine dependence: Secondary | ICD-10-CM

## 2014-04-18 DIAGNOSIS — I252 Old myocardial infarction: Secondary | ICD-10-CM

## 2014-04-18 LAB — POCT URINALYSIS DIPSTICK
Bilirubin, UA: NEGATIVE
Blood, UA: NEGATIVE
Glucose, UA: NEGATIVE
Ketones, UA: NEGATIVE
Leukocytes, UA: NEGATIVE
Nitrite, UA: NEGATIVE
Protein, UA: NEGATIVE
Spec Grav, UA: <=1.005
Urobilinogen, UA: NEGATIVE
pH, UA: 7

## 2014-04-18 LAB — HEMOGLOBIN A1C
Hgb A1c MFr Bld: 5.9 % — ABNORMAL HIGH (ref <5.7)
Mean Plasma Glucose: 123 mg/dL — ABNORMAL HIGH (ref <117)

## 2014-05-22 ENCOUNTER — Other Ambulatory Visit: Payer: Self-pay | Admitting: Internal Medicine

## 2014-06-13 ENCOUNTER — Encounter: Payer: Self-pay | Admitting: Physician Assistant

## 2014-06-13 ENCOUNTER — Ambulatory Visit (INDEPENDENT_AMBULATORY_CARE_PROVIDER_SITE_OTHER): Payer: 59 | Admitting: Physician Assistant

## 2014-06-13 VITALS — BP 120/82 | HR 75 | Ht 65.5 in | Wt 200.0 lb

## 2014-06-13 DIAGNOSIS — I2510 Atherosclerotic heart disease of native coronary artery without angina pectoris: Secondary | ICD-10-CM

## 2014-06-13 DIAGNOSIS — Z720 Tobacco use: Secondary | ICD-10-CM

## 2014-06-13 DIAGNOSIS — I10 Essential (primary) hypertension: Secondary | ICD-10-CM

## 2014-06-13 DIAGNOSIS — F17200 Nicotine dependence, unspecified, uncomplicated: Secondary | ICD-10-CM

## 2014-06-13 DIAGNOSIS — E785 Hyperlipidemia, unspecified: Secondary | ICD-10-CM

## 2014-06-13 MED ORDER — NITROGLYCERIN 0.4 MG SL SUBL: 0.4000 mg | SUBLINGUAL_TABLET | SUBLINGUAL | Status: DC | PRN

## 2014-06-13 NOTE — Progress Notes (Signed)
Cardiology Office Note    Date:  06/13/2014   ID:  XYLINA RHOADS, DOB 03/16/1966, MRN [id number]  PCP:  Elby Showers, MD  Cardiologist:  Dr. Sherren Mocha      History of Present Illness: [name] is a 48 y.o. female with a hx of CAD, s/p anterior STEMI 11/2012 presumed to be from vasospasm (LHC with no significant CAD except for subtotal occlusion of D2 sub-branch), HTN, HL.  Last seen by Dr. Sherren Mocha 12/2013.  She returns for follow up.  She is doing well.  She has occasional chest pain. This is atypical.  She denies any symptoms like her prior angina.  She denies dyspnea, orthopnea, PND, edema, syncope.     Studies:  - LHC (11/22/12):  subbranch of D2 subtotal occlusion (not amenable to PCI, minor CAD of LAD and RCA, EF 55-65%.  - Echo (11/23/12):  EF 60-65%, no RWMA, Gr 2 DD     Recent Labs: 04/16/2014: ALT 33; Creatinine 0.79; HDL Cholesterol by NMR 54; Hemoglobin 15.0; LDL (calc) 62; Potassium 4.6; TSH 1.285   Wt Readings from Last 3 Encounters:  06/13/14 200 lb (90.719 kg)  04/18/14 204 lb (92.534 kg)  12/14/13 201 lb (91.173 kg)     Past Medical History  Diagnosis Date  . Hypertension     Controlled - she states she is on BP meds for HSP  . Anxiety   . Hypothyroidism   . GERD (gastroesophageal reflux disease)   . ADHD (attention deficit hyperactivity disorder)   . 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 GI bleed     2006: secondary to leukoclastic vasculitis  per chart  . Henoch-Schonlein purpura     HSP tx at [facility] in 2006 (also h/o leukocytoclastic vasculitis per chart 2006)  . Asthma   . Sleep apnea     "lost 50#; no problems w/it now" (11/22/2012)  . Migraines   . Depression   . Chronic kidney disease     in setting of HSP 2006  . CAD (coronary artery disease)     a. 11/2012 Acute Anterior STEMI/Cath: Nonobs dzs except subtotal occlusion of small, 60mm  D2, EF 55-65%;  b. 11/2012  Echo: EF 60-65%, nl wall motion, Gr 2 DD.  . Tobacco abuse   . Coronary vasospasm     a. presumed    Current Outpatient Prescriptions  Medication Sig Dispense Refill  . ALPRAZolam (XANAX) 0.25 MG tablet Take 0.125 mg by mouth daily as needed. States she takes maybe once or twice a week      . amLODipine (NORVASC) 5 MG tablet Take 5 mg by mouth daily.      Marland Kitchen aspirin EC 81 MG EC tablet Take 1 tablet (81 mg total) by mouth daily.      Marland Kitchen atorvastatin (LIPITOR) 40 MG tablet Take 40 mg by mouth daily.      . calcium-vitamin D (OSCAL WITH D) 500-200 MG-UNIT per tablet Take 1 tablet by mouth daily.      Marland Kitchen desvenlafaxine (PRISTIQ) 50 MG 24 hr tablet Take 50 mg by mouth daily.      . enalapril (VASOTEC) 10 MG tablet TAKE 1 TABLET BY MOUTH ONCE DAILY  30 tablet  PRN  . HYDROcodone-acetaminophen (NORCO/VICODIN) 5-325 MG per tablet Take 1 tablet by mouth 2 (two) times daily as needed.  60 tablet  0  . levothyroxine (SYNTHROID, LEVOTHROID) 75 MCG  tablet TAKE 1 TABLET BY MOUTH DAILY  90 tablet  3  . nitroGLYCERIN (NITROSTAT) 0.4 MG SL tablet Place 1 tablet (0.4 mg total) under the tongue every 5 (five) minutes as needed for chest pain.  25 tablet  3  . ondansetron (ZOFRAN ODT) 4 MG disintegrating tablet 4mg  ODT q4 hours prn nausea/vomit  4 tablet  0  . pantoprazole (PROTONIX) 40 MG tablet Take 40 mg by mouth daily.      Marland Kitchen PRISTIQ 50 MG 24 hr tablet TAKE 1 TABLET BY MOUTH ONCE DAILY  90 tablet  PRN   No current facility-administered medications for this visit.    Allergies:   Levaquin and Nsaids   Social History:  The patient  reports that she has been smoking Cigarettes.  She has a 12.5 pack-year smoking history. She has never used smokeless tobacco. She reports that she does not drink alcohol or use illicit drugs.   Family History:  The patient's family history includes Hypertension in her mother; Stroke in her father. There is no history of Heart disease.   ROS:  Please  see the history of present illness.      All other systems reviewed and negative.    PHYSICAL EXAM: VS:  BP 120/82  Pulse 75  Ht 5' 5.5" (1.664 m)  Wt 200 lb (90.719 kg)  BMI 32.76 kg/m2 Well nourished, well developed, in no acute distress HEENT: normal Neck: no JVD Vascular:  No carotid bruits Cardiac:  normal S1, S2; RRR; no murmur Lungs:  clear to auscultation bilaterally, no wheezing, rhonchi or rales Abd: soft, nontender, no hepatomegaly Ext: no edema Skin: warm and dry Neuro:  CNs 2-12 intact, no focal abnormalities noted  EKG:  NSR, HR 75, no ST changes      ASSESSMENT AND PLAN:  1. Coronary artery disease:  No angina.  Continue ASA, statin.   2. Essential hypertension:  She brings in a list of her BPs.  Most are optimal.  BP by me is optimal today. Continue current Rx.  Recent creatinine ok.   3. Dyslipidemia:  Recent LDL optimal.  Continue current dose of statin. 4. Tobacco abuse:  I have recommended that she quit. 5. Disposition:  Follow up with Dr. Sherren Mocha in 6 mos.    Signed, Versie Starks, MHS 06/13/2014 8:55 AM    [facility] [address], [place], [place]  [address] Phone: [phone]; Fax: [phone]  b

## 2014-06-13 NOTE — Patient Instructions (Signed)
NO CHANGES WERE MADE TODAY  RX NTG SENT TO [facility]   Your physician wants you to follow-up in: [place]. Burt Knack. You will receive a reminder letter in the mail two months in advance. If you don't receive a letter, please call our office to schedule the follow-up appointment.

## 2014-06-18 ENCOUNTER — Other Ambulatory Visit: Payer: Self-pay | Admitting: Cardiovascular Disease

## 2014-06-24 ENCOUNTER — Encounter: Payer: Self-pay | Admitting: Internal Medicine

## 2014-06-24 ENCOUNTER — Ambulatory Visit (INDEPENDENT_AMBULATORY_CARE_PROVIDER_SITE_OTHER): Payer: 59 | Admitting: Internal Medicine

## 2014-06-24 ENCOUNTER — Other Ambulatory Visit: Payer: Self-pay

## 2014-06-24 DIAGNOSIS — F4321 Adjustment disorder with depressed mood: Secondary | ICD-10-CM

## 2014-06-24 MED ORDER — HYDROCODONE-ACETAMINOPHEN 5-325 MG PO TABS: 1.0000 | ORAL_TABLET | Freq: Two times a day (BID) | ORAL | Status: DC | PRN

## 2014-06-24 NOTE — Progress Notes (Signed)
   Subjective:    Patient ID: [name], female    DOB: August 26, 1966, 48 y.o.   MRN: [id number]  HPI mother had an apparent respiratory arrest at home and is hospitalized at [facility] in the intensive care unit. They're trying to determine if she has brain activity. Patient is concerned she may be losing her mother and has considerable grief and anxiety. She has Xanax to take. Pick up prescription today for hydrocodone/APAP for musculoskeletal pain. Spent 20 minutes with patient talking her about these issues.    Review of Systems     Objective:   Physical Exam  Not examined      Assessment & Plan:  Grief reaction  Plan: Has Xanax to take as needed for anxiety

## 2014-06-24 NOTE — Patient Instructions (Signed)
Take hydrocodone/APAP as needed for musculoskeletal pain. Exam x-ray anxiety.

## 2014-07-10 ENCOUNTER — Other Ambulatory Visit: Payer: Self-pay | Admitting: Obstetrics & Gynecology

## 2014-07-12 LAB — CYTOLOGY - PAP

## 2014-08-08 ENCOUNTER — Other Ambulatory Visit: Payer: Self-pay

## 2014-08-08 MED ORDER — HYDROCODONE-ACETAMINOPHEN 5-325 MG PO TABS: 1.0000 | ORAL_TABLET | Freq: Two times a day (BID) | ORAL | Status: DC | PRN

## 2014-08-11 ENCOUNTER — Encounter: Payer: Self-pay | Admitting: Internal Medicine

## 2014-08-11 NOTE — Progress Notes (Signed)
Subjective:    Patient ID: [name], female    DOB: [birth year], 48 y.o.   MRN: [id number]  HPI 48 year old White Female in today for health maintenance and evaluation of medical issues. Patient had an anterior wall MI December 2013. She had a subtotal occlusion of a very small branch of the diagonal and no other significant disease. She underwent urgent cardiac catheterization and did well.  Unfortunately continues to smoke. She has a history of hypertension, asthma, anxiety depression, GE reflux, remote history of HS purpura, hypothyroidism, chronic musculoskeletal pain. She is overweight.  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 She developed itchy hands and arthralgias in her joints. Subsequently developed purpuric lesions over her lower extremities and had maroon colored stools. Colonoscopy showed inflamed terminal ileum. Hemoglobin dropped to 9.4 g. MRA of the celiac access proved to be negative for ischemic bowel. She developed ascites and CT scan showed diffuse colitis. She was transferred to [facility] after developing tea-colored urine with hematuria. 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 Was treated with high-dose steroids which she remained on for several months. Was treated by Dr. Marlana Salvage at [facility] in the rheumatology department. In September 2009 was hospitalized with acute gastroenteritis secondary to Clostridium difficile.  During hospitalization in 2006 she had a 2-D echocardiogram that was normal. In 2009 had a normal cardiac nuclear medicine study.  History of sleep apnea. She is not on CPAP. Weight loss has been recommended for sleep apnea. Sleep study showed desaturation to 87% with snoring. History of migraine headaches. History of cervical radiculopathy 2012. She had an osteophyte causing some mild foraminal stenosis and impingement on MRI and was seen by Dr. Vertell Limber.  For some time she was maintained on Adderall for  attention deficit disorder but this was discontinued after MI.  History of endometrial polyp causing menorrhagia removed by Dr. Sander Radon November 2003. Had cesarean sections 1995, 1996, 1999. Appendectomy in the early 1970s. No history of fractures.  Social history: Married with 3 sons. She does not consume alcohol. Has smoked a half pack cigarettes daily for well over 20 years.  Family history: Both parents with hypertension. Father with history of stroke. 2 brothers in good health. No sisters.     Review of Systems  Constitutional: Positive for fatigue.  Respiratory:       History of sleep apnea. History of reactive airways disease. Develops wheezing with acute respiratory infections.  Cardiovascular:       History of MI  Gastrointestinal: Negative.   Endocrine:       History of hypothyroidism  Psychiatric/Behavioral:       Anxiety and depression       Objective:   Physical Exam  Vitals reviewed. Constitutional: She is oriented to person, place, and time. She appears well-developed and well-nourished. No distress.  HENT:  Head: Normocephalic and atraumatic.  Right Ear: External ear normal.  Left Ear: External ear normal.  Mouth/Throat: Oropharynx is clear and moist. No oropharyngeal exudate.  Eyes: Conjunctivae and EOM are normal. Pupils are equal, round, and reactive to light. Right eye exhibits no discharge. Left eye exhibits no discharge. No scleral icterus.  Neck: Neck supple. No JVD present. No thyromegaly present.  Cardiovascular: Normal rate, regular rhythm, normal heart sounds and intact distal pulses.   No murmur heard. Pulmonary/Chest: Effort normal and breath sounds normal. No respiratory distress. She has no wheezes. She has no rales.  Breasts normal female  without masses  Abdominal: Soft. Bowel sounds are normal. She exhibits no distension and no mass. There is no tenderness. There is no rebound and no guarding.  Genitourinary:  Deferred to Dr. Nori Riis    Musculoskeletal: Normal range of motion. She exhibits no edema.  Neurological: She is alert and oriented to person, place, and time. She has normal reflexes. No cranial nerve deficit. Coordination normal.  Skin: Skin is warm and dry. No rash noted. She is not diaphoretic.  Psychiatric: She has a normal mood and affect. Her behavior is normal. Judgment and thought content normal.          Assessment & Plan:   Hypertension  Hypothyroidism  History of smoking-reminded once again to quit  Coronary artery disease status post MI December 2013.  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 Hypertension stable on current regimen. TSH is within normal limits. Anxiety depression treated with SSRI. Continue followup with Cardiology . Recommend annual mammogram. Receives influenza immunization through employment. Continue on statin medication status post MI. Return in 6 months.

## 2014-08-11 NOTE — Patient Instructions (Signed)
Please stop smoking. Continue same medications and return in 6 months. Have annual mammogram.

## 2014-08-20 ENCOUNTER — Other Ambulatory Visit: Payer: Self-pay | Admitting: Internal Medicine

## 2014-09-13 ENCOUNTER — Other Ambulatory Visit (HOSPITAL_COMMUNITY): Payer: Self-pay | Admitting: Cardiovascular Disease

## 2014-09-13 ENCOUNTER — Other Ambulatory Visit: Payer: Self-pay | Admitting: Internal Medicine

## 2014-09-13 NOTE — Telephone Encounter (Signed)
I have let the patient know she should be getting this from her PCP.  It was filled by PCP in 10/2013(30 day), 11/2013(90day), and 02/2014(90 day), then by Burt Knack in 05/2014(90 day).  I let her know I would fill for one months and then she needs to get refills from PCP.  Patient states she take medication twice daily

## 2014-09-20 ENCOUNTER — Other Ambulatory Visit: Payer: Self-pay | Admitting: Internal Medicine

## 2014-09-20 ENCOUNTER — Ambulatory Visit (INDEPENDENT_AMBULATORY_CARE_PROVIDER_SITE_OTHER): Payer: 59 | Admitting: Internal Medicine

## 2014-09-20 ENCOUNTER — Telehealth: Payer: Self-pay

## 2014-09-20 ENCOUNTER — Encounter: Payer: Self-pay | Admitting: Internal Medicine

## 2014-09-20 VITALS — BP 128/84 | HR 70 | Temp 97.9°F | Ht 65.0 in | Wt 202.0 lb

## 2014-09-20 DIAGNOSIS — N6320 Unspecified lump in the left breast, unspecified quadrant: Principal | ICD-10-CM

## 2014-09-20 DIAGNOSIS — N6325 Unspecified lump in the left breast, overlapping quadrants: Secondary | ICD-10-CM

## 2014-09-20 DIAGNOSIS — N63 Unspecified lump in unspecified breast: Secondary | ICD-10-CM

## 2014-09-20 NOTE — Telephone Encounter (Signed)
Breast ultrasound is 10/01/2014 at 815 am.  Patient aware.

## 2014-09-20 NOTE — Patient Instructions (Signed)
To have diagnostic mammogram and ultrasound if necessary as soon as possible.

## 2014-09-20 NOTE — Progress Notes (Signed)
   Subjective:    Patient ID: [name], female    DOB: 1966-06-10, 48 y.o.   MRN: [id number]  HPI  Patient is under a lot of stress. Her mother died recently. Husband's mother has been sick as well with myasthenia gravis. Her son is depressed. Patient has a history of coronary artery disease. She continues to smoke.  She is here today because she is discovered a nodule in her left breast. She last had mammogram December 2014 which was normal.    Review of Systems     Objective:   Physical Exam  Nontender nodule left breast at 1:00 position. It is firm and is approximately 1 cm. No left axillary adenopathy.      Assessment & Plan:  Nodule left breast at 1:00  Grief reaction from loss of mother  Situational stress  Plan: Diagnostic mammogram and ultrasound if necessary.

## 2014-10-01 ENCOUNTER — Ambulatory Visit
Admission: RE | Admit: 2014-10-01 | Discharge: 2014-10-01 | Disposition: A | Discharge status: Home / Self Care | Payer: 59 | Source: Ambulatory Visit | Attending: Internal Medicine | Admitting: Internal Medicine

## 2014-10-01 DIAGNOSIS — N6325 Unspecified lump in the left breast, overlapping quadrants: Secondary | ICD-10-CM

## 2014-10-01 DIAGNOSIS — N6320 Unspecified lump in the left breast, unspecified quadrant: Principal | ICD-10-CM

## 2014-10-22 ENCOUNTER — Other Ambulatory Visit: Payer: 59 | Admitting: Internal Medicine

## 2014-10-22 DIAGNOSIS — E785 Hyperlipidemia, unspecified: Secondary | ICD-10-CM

## 2014-10-22 DIAGNOSIS — R7302 Impaired glucose tolerance (oral): Secondary | ICD-10-CM

## 2014-10-22 DIAGNOSIS — E039 Hypothyroidism, unspecified: Secondary | ICD-10-CM

## 2014-10-22 DIAGNOSIS — I10 Essential (primary) hypertension: Secondary | ICD-10-CM

## 2014-10-23 LAB — HEMOGLOBIN A1C
Hgb A1c MFr Bld: 5.8 % — ABNORMAL HIGH (ref <5.7)
Mean Plasma Glucose: 120 mg/dL — ABNORMAL HIGH (ref <117)

## 2014-10-23 LAB — HEPATIC FUNCTION PANEL
ALT: 57 U/L — ABNORMAL HIGH (ref 0–35)
AST: 41 U/L — ABNORMAL HIGH (ref 0–37)
Albumin: 4.3 g/dL (ref 3.5–5.2)
Alkaline Phosphatase: 82 U/L (ref 39–117)
Bilirubin, Direct: 0.1 mg/dL (ref 0.0–0.3)
Indirect Bilirubin: 0.4 mg/dL (ref 0.2–1.2)
Total Bilirubin: 0.5 mg/dL (ref 0.2–1.2)
Total Protein: 6.7 g/dL (ref 6.0–8.3)

## 2014-10-23 LAB — TSH: TSH: 0.821 u[IU]/mL (ref 0.350–4.500)

## 2014-10-23 LAB — LIPID PANEL
Cholesterol: 182 mg/dL (ref 0–200)
HDL: 52 mg/dL (ref >39)
LDL Cholesterol: 110 mg/dL — ABNORMAL HIGH (ref 0–99)
Total CHOL/HDL Ratio: 3.5 Ratio
Triglycerides: 100 mg/dL (ref <150)
VLDL: 20 mg/dL (ref 0–40)

## 2014-10-24 ENCOUNTER — Ambulatory Visit (INDEPENDENT_AMBULATORY_CARE_PROVIDER_SITE_OTHER): Payer: 59 | Admitting: Internal Medicine

## 2014-10-24 VITALS — BP 118/80 | HR 79 | Temp 98.1°F

## 2014-10-24 DIAGNOSIS — Z87891 Personal history of nicotine dependence: Secondary | ICD-10-CM

## 2014-10-24 DIAGNOSIS — M7918 Myalgia, other site: Secondary | ICD-10-CM

## 2014-10-24 DIAGNOSIS — M791 Myalgia: Secondary | ICD-10-CM

## 2014-10-24 DIAGNOSIS — Z720 Tobacco use: Secondary | ICD-10-CM

## 2014-10-24 DIAGNOSIS — F329 Major depressive disorder, single episode, unspecified: Secondary | ICD-10-CM

## 2014-10-24 DIAGNOSIS — F32A Depression, unspecified: Secondary | ICD-10-CM

## 2014-10-24 DIAGNOSIS — E669 Obesity, unspecified: Secondary | ICD-10-CM

## 2014-10-24 DIAGNOSIS — F4321 Adjustment disorder with depressed mood: Secondary | ICD-10-CM

## 2014-10-24 DIAGNOSIS — F419 Anxiety disorder, unspecified: Secondary | ICD-10-CM

## 2014-10-24 DIAGNOSIS — F418 Other specified anxiety disorders: Secondary | ICD-10-CM

## 2014-10-24 DIAGNOSIS — E039 Hypothyroidism, unspecified: Secondary | ICD-10-CM

## 2014-10-24 DIAGNOSIS — I519 Heart disease, unspecified: Secondary | ICD-10-CM

## 2014-10-24 DIAGNOSIS — F4320 Adjustment disorder, unspecified: Secondary | ICD-10-CM

## 2014-10-24 MED ORDER — AMOXICILLIN-POT CLAVULANATE 500-125 MG PO TABS: 1.0000 | ORAL_TABLET | Freq: Three times a day (TID) | ORAL | Status: DC

## 2014-10-24 MED ORDER — HYDROCODONE-ACETAMINOPHEN 5-325 MG PO TABS: 1.0000 | ORAL_TABLET | Freq: Two times a day (BID) | ORAL | Status: DC | PRN

## 2014-11-14 ENCOUNTER — Encounter (HOSPITAL_COMMUNITY): Payer: Self-pay | Admitting: Cardiovascular Disease

## 2014-12-20 ENCOUNTER — Encounter: Payer: Self-pay | Admitting: Cardiovascular Disease

## 2014-12-20 ENCOUNTER — Ambulatory Visit (INDEPENDENT_AMBULATORY_CARE_PROVIDER_SITE_OTHER): Payer: 59 | Admitting: Cardiovascular Disease

## 2014-12-20 VITALS — BP 146/92 | HR 73 | Ht 65.0 in | Wt 207.0 lb

## 2014-12-20 DIAGNOSIS — I2510 Atherosclerotic heart disease of native coronary artery without angina pectoris: Secondary | ICD-10-CM

## 2014-12-20 DIAGNOSIS — I10 Essential (primary) hypertension: Secondary | ICD-10-CM

## 2014-12-20 MED ORDER — AMLODIPINE BESYLATE 5 MG PO TABS: 5.0000 mg | ORAL_TABLET | Freq: Every day | ORAL | Status: DC

## 2014-12-20 MED ORDER — ENALAPRIL MALEATE 10 MG PO TABS: 10.0000 mg | ORAL_TABLET | Freq: Every day | ORAL | Status: DC

## 2014-12-20 NOTE — Progress Notes (Signed)
Cardiology Office Note   Date:  12/20/2014   ID:  Para, Cossey 07-22-1966, MRN [id number]  PCP:  Elby Showers, MD  Cardiologist:  Sherren Mocha, MD    Chief Complaint  Patient presents with  . Coronary Artery Disease     History of Present Illness: [name] is a 49 y.o. female who presents for follow-up of coronary artery disease.  She is followed for coronary artery disease. She presented in December 2013 with an acute anterior wall MI. Cardiac catheterization demonstrated patency of the LAD with subtotal occlusion of a very small diagonal branch. She had a significant cardiac enzyme rise, but her LV function was within normal limits. Presumably this was a vasospastic event. She returns today for followup.  The patient lost her mother this past summer. It's been a difficult year for her. She has occasional pains in her left upper arm. Not sure whether a 'sore muscle' or whether heart - related. No exertional chest or arm discomfort. Describes DOE but relates this to being 'out of shape.' No edema, orthopnea, or PND. Still smoking, about 1/2 ppd or less.   Past Medical History  Diagnosis Date  . Hypertension     Controlled - she states she is on BP meds for HSP  . Anxiety   . Hypothyroidism   . GERD (gastroesophageal reflux disease)   . ADHD (attention deficit hyperactivity disorder)   . 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 GI bleed     2006: secondary to leukoclastic vasculitis  per chart  . Henoch-Schonlein purpura     HSP tx at [facility] in 2006 (also h/o leukocytoclastic vasculitis per chart 2006)  . Asthma   . Sleep apnea     "lost 50#; no problems w/it now" (11/22/2012)  . Migraines   . Depression   . Chronic kidney disease     in setting of HSP 2006  . CAD (coronary artery disease)     a. 11/2012 Acute Anterior STEMI/Cath: Nonobs dzs except subtotal occlusion of  small, 33mm D2, EF 55-65%;  b. 11/2012  Echo: EF 60-65%, nl wall motion, Gr 2 DD.  . Tobacco abuse   . Coronary vasospasm     a. presumed    Current Outpatient Prescriptions  Medication Sig Dispense Refill  . ALPRAZolam (XANAX) 0.25 MG tablet Take 0.125 mg by mouth daily as needed. States she takes maybe once or twice a week    . amLODipine (NORVASC) 5 MG tablet TAKE 1 TABLET BY MOUTH ONCE DAILY 30 tablet 5  . aspirin EC 81 MG EC tablet Take 1 tablet (81 mg total) by mouth daily.    Marland Kitchen atorvastatin (LIPITOR) 40 MG tablet Take 40 mg by mouth daily.    . calcium-vitamin D (OSCAL WITH D) 500-200 MG-UNIT per tablet Take 1 tablet by mouth daily.    Marland Kitchen desvenlafaxine (PRISTIQ) 50 MG 24 hr tablet Take 50 mg by mouth daily.    . enalapril (VASOTEC) 10 MG tablet TAKE 1 TABLET BY MOUTH ONCE DAILY (Patient taking differently: Take 5mg  (one half tablet) once daily by mouth) 30 tablet PRN  . HYDROcodone-acetaminophen (NORCO/VICODIN) 5-325 MG per tablet Take 1 tablet by mouth 2 (two) times daily as needed. 180 tablet 0  . levothyroxine (SYNTHROID, LEVOTHROID) 75 MCG tablet TAKE 1 TABLET BY MOUTH DAILY 90 tablet 3  . nitroGLYCERIN (NITROSTAT) 0.4 MG SL tablet Place 1 tablet (0.4  mg total) under the tongue every 5 (five) minutes as needed for chest pain. 25 tablet 3  . ondansetron (ZOFRAN ODT) 4 MG disintegrating tablet 4mg  ODT q4 hours prn nausea/vomit 4 tablet 0  . pantoprazole (PROTONIX) 40 MG tablet TAKE 1 TABLET BY MOUTH TWICE DAILY BEFORE MEALS 60 tablet 11   No current facility-administered medications for this visit.    Allergies:   Levaquin and Nsaids   Social History:  The patient  reports that she has been smoking Cigarettes.  She has a 12.5 pack-year smoking history. She has never used smokeless tobacco. She reports that she does not drink alcohol or use illicit drugs.   Family History:  The patient's  family history includes Hypertension in her mother; Stroke in her father. There is no history  of Heart disease.    ROS:  Please see the history of present illness.  Otherwise, review of systems is positive for cough, depression, easy bruising, skipped heartbeats, snoring, wheezing, anxiety.  All other systems are reviewed and negative.    PHYSICAL EXAM: VS:  BP 146/92 mmHg  Pulse 73  Ht 5\' 5"  (1.651 m)  Wt 207 lb (93.895 kg)  BMI 34.45 kg/m2 , BMI Body mass index is 34.45 kg/(m^2). GEN: Well nourished, well developed, in no acute distress HEENT: normal Neck: no JVD, carotid bruits, or masses Cardiac: RRR; no murmurs, rubs, or gallops,no edema  Respiratory:  clear to auscultation bilaterally, normal work of breathing GI: soft, nontender, nondistended, + BS MS: no deformity or atrophy Skin: warm and dry, no rash Neuro:  Strength and sensation are intact Psych: euthymic mood, full affect  EKG:  EKG is not ordered today.  Recent Labs: 04/16/2014: BUN 8; Creatinine 0.79; Hemoglobin 15.0; Platelets 188; Potassium 4.6; Sodium 141 10/22/2014: ALT 57*; TSH 0.821   Lipid Panel     Component Value Date/Time   CHOL 182 10/22/2014 0923   TRIG 100 10/22/2014 0923   HDL 52 10/22/2014 0923   CHOLHDL 3.5 10/22/2014 0923   VLDL 20 10/22/2014 0923   LDLCALC 110* 10/22/2014 0923          Wt Readings from Last 3 Encounters:  12/20/14 207 lb (93.895 kg)  09/20/14 202 lb (91.627 kg)  06/13/14 200 lb (90.719 kg)     Other studies Reviewed: 2-D echocardiogram 06/23/2012: Study Conclusions  Left ventricle: The cavity size was normal. Wall thickness was normal. Systolic function was normal. The estimated ejection fraction was in the range of 60% to 65%. Wall motion was normal; there were no regional wall motion abnormalities. Features are consistent with a pseudonormal left ventricular filling pattern, with concomitant abnormal relaxation and increased filling pressure (grade 2 diastolic dysfunction).    ASSESSMENT AND PLAN: 1.  Coronary artery disease, native vessel,  without symptoms of angina. The patient's occasional left arm pains do not sound consistent with angina. There is no exertional component. He should continue to treat her medically. The main focus of our visit today was related to counseling for lifestyle modification. Specifically we discussed weight loss strategies, dietary changes, initiation of an exercise program, and tobacco cessation. I will see her back in one year for follow-up evaluation.  2. Hyperlipidemia. I reviewed the patient's most recent lipid panel. Her LDL was above goal. Her previous LDL was excellent at 63 mg/dL. She reports that when her blood work was done, she was not taking atorvastatin on a regular basis. She has started doing this and Dr. Renold Genta is following her lab work.  I suspect she will be at goal on her next check.  3. Essential hypertension. Blood pressure control is suboptimal. I reviewed her blood pressure readings over the last 12 months, and 50% of these readings are greater than 140/90. Recommended increase of enalapril from one half tablet at bedtime to 1 full tablet at bedtime. Also reviewed lifestyle modification as above.  4. Tobacco abuse. Stressed the importance of complete cessation. She is considering nicotine gum. She did not do well with Chantix in the past.   Current medicines are reviewed with the patient today.  The patient does not have concerns regarding medicines.  The following changes have been made:  Increase enalapril from 5 mg to 10 mg at bedtime  Labs/ tests ordered today include: None  No orders of the defined types were placed in this encounter.     Disposition:   FU with me in 12 months  Signed, Sherren Mocha, MD  12/20/2014 8:41 AM    [facility] [address], [place], [place]  [address] Phone: [phone]; Fax: [phone]

## 2014-12-20 NOTE — Patient Instructions (Signed)
Increase enalapril to 10mg  daily.   Fat and Cholesterol Control Diet Fat and cholesterol levels in your blood and organs are influenced by your diet. High levels of fat and cholesterol may lead to diseases of the heart, small and large blood vessels, gallbladder, liver, and pancreas. CONTROLLING FAT AND CHOLESTEROL WITH DIET Although exercise and lifestyle factors are important, your diet is key. That is because certain foods are known to raise cholesterol and others to lower it. The goal is to balance foods for their effect on cholesterol and more importantly, to replace saturated and trans fat with other types of fat, such as monounsaturated fat, polyunsaturated fat, and omega-3 fatty acids. On average, a person should consume no more than 15 to 17 g of saturated fat daily. Saturated and trans fats are considered "bad" fats, and they will raise LDL cholesterol. Saturated fats are primarily found in animal products such as meats, butter, and cream. However, that does not mean you need to give up all your favorite foods. Today, there are good tasting, low-fat, low-cholesterol substitutes for most of the things you like to eat. Choose low-fat or nonfat alternatives. Choose round or loin cuts of red meat. These types of cuts are lowest in fat and cholesterol. Chicken (without the skin), fish, veal, and ground [place] breast are great choices. Eliminate fatty meats, such as hot dogs and salami. Even shellfish have little or no saturated fat. Have a 3 oz (85 g) portion when you eat lean meat, poultry, or fish. Trans fats are also called "partially hydrogenated oils." They are oils that have been scientifically manipulated so that they are solid at room temperature resulting in a longer shelf life and improved taste and texture of foods in which they are added. Trans fats are found in stick margarine, some tub margarines, cookies, crackers, and baked goods.  When baking and cooking, oils are a great substitute for  butter. The monounsaturated oils are especially beneficial since it is believed they lower LDL and raise HDL. The oils you should avoid entirely are saturated tropical oils, such as coconut and palm.  Remember to eat a lot from food groups that are naturally free of saturated and trans fat, including fish, fruit, vegetables, beans, grains (barley, rice, couscous, bulgur wheat), and pasta (without cream sauces).  IDENTIFYING FOODS THAT LOWER FAT AND CHOLESTEROL  Soluble fiber may lower your cholesterol. This type of fiber is found in fruits such as apples, vegetables such as broccoli, potatoes, and carrots, legumes such as beans, peas, and lentils, and grains such as barley. Foods fortified with plant sterols (phytosterol) may also lower cholesterol. You should eat at least 2 g per day of these foods for a cholesterol lowering effect.   Read package labels to identify low-saturated fats, trans fat free, and low-fat foods at the supermarket. Select cheeses that have only 2 to 3 g saturated fat per ounce. Use a heart-healthy tub margarine that is free of trans fats or partially hydrogenated oil. When buying baked goods        Smoking Cessation Quitting smoking is important to your health and has many advantages. However, it is not always easy to quit since nicotine is a very addictive drug. Oftentimes, people try 3 times or more before being able to quit. This document explains the best ways for you to prepare to quit smoking. Quitting takes hard work and a lot of effort, but you can do it. ADVANTAGES OF QUITTING SMOKING  You will live longer, feel  better, and live better.  Your body will feel the impact of quitting smoking almost immediately.  Within 20 minutes, blood pressure decreases. Your pulse returns to its normal level.  After 8 hours, carbon monoxide levels in the blood return to normal. Your oxygen level increases.  After 24 hours, the chance of having a heart attack starts to  decrease. Your breath, hair, and body stop smelling like smoke.  After 48 hours, damaged nerve endings begin to recover. Your sense of taste and smell improve.  After 72 hours, the body is virtually free of nicotine. Your bronchial tubes relax and breathing becomes easier.  After 2 to 12 weeks, lungs can hold more air. Exercise becomes easier and circulation improves.  The risk of having a heart attack, stroke, cancer, or lung disease is greatly reduced.  After 1 year, the risk of coronary heart disease is cut in half.  After 5 years, the risk of stroke falls to the same as a nonsmoker.  After 10 years, the risk of lung cancer is cut in half and the risk of other cancers decreases significantly.  After 15 years, the risk of coronary heart disease drops, usually to the level of a nonsmoker.  If you are pregnant, quitting smoking will improve your chances of having a healthy baby.  The people you live with, especially any children, will be healthier.  You will have extra money to spend on things other than cigarettes. QUESTIONS TO THINK ABOUT BEFORE ATTEMPTING TO QUIT You may want to talk about your answers with your health care provider.  Why do you want to quit?  If you tried to quit in the past, what helped and what did not?  What will be the most difficult situations for you after you quit? How will you plan to handle them?  Who can help you through the tough times? Your family? Friends? A health care provider?  What pleasures do you get from smoking? What ways can you still get pleasure if you quit? Here are some questions to ask your health care provider:  How can you help me to be successful at quitting?  What medicine do you think would be best for me and how should I take it?  What should I do if I need more help?  What is smoking withdrawal like? How can I get information on withdrawal? GET READY  Set a quit date.  Change your environment by getting rid of all  cigarettes, ashtrays, matches, and lighters in your home, car, or work. Do not let people smoke in your home.  Review your past attempts to quit. Think about what worked and what did not. GET SUPPORT AND ENCOURAGEMENT You have a better chance of being successful if you have help. You can get support in many ways.  Tell your family, friends, and coworkers that you are going to quit and need their support. Ask them not to smoke around you.  Get individual, group, or telephone counseling and support. Programs are available at [employer] and health centers. Call your local health department for information about programs in your area.  Spiritual beliefs and practices may help some smokers quit.  Download a "quit meter" on your computer to keep track of quit statistics, such as how long you have gone without smoking, cigarettes not smoked, and money saved.  Get a self-help book about quitting smoking and staying off tobacco. Lincoln yourself from urges to smoke. Talk to  someone, go for a walk, or occupy your time with a task.  Change your normal routine. Take a different route to work. Drink tea instead of coffee. Eat breakfast in a different place.  Reduce your stress. Take a hot bath, exercise, or read a book.  Plan something enjoyable to do every day. Reward yourself for not smoking.  Explore interactive web-based programs that specialize in helping you quit. GET MEDICINE AND USE IT CORRECTLY Medicines can help you stop smoking and decrease the urge to smoke. Combining medicine with the above behavioral methods and support can greatly increase your chances of successfully quitting smoking.  Nicotine replacement therapy helps deliver nicotine to your body without the negative effects and risks of smoking. Nicotine replacement therapy includes nicotine gum, lozenges, inhalers, nasal sprays, and skin patches. Some may be available over-the-counter and  others require a prescription.  Antidepressant medicine helps people abstain from smoking, but how this works is unknown. This medicine is available by prescription.  Nicotinic receptor partial agonist medicine simulates the effect of nicotine in your brain. This medicine is available by prescription. Ask your health care provider for advice about which medicines to use and how to use them based on your health history. Your health care provider will tell you what side effects to look out for if you choose to be on a medicine or therapy. Carefully read the information on the package. Do not use any other product containing nicotine while using a nicotine replacement product.  RELAPSE OR DIFFICULT SITUATIONS Most relapses occur within the first 3 months after quitting. Do not be discouraged if you start smoking again. Remember, most people try several times before finally quitting. You may have symptoms of withdrawal because your body is used to nicotine. You may crave cigarettes, be irritable, feel very hungry, cough often, get headaches, or have difficulty concentrating. The withdrawal symptoms are only temporary. They are strongest when you first quit, but they will go away within 10-14 days. To reduce the chances of relapse, try to:  Avoid drinking alcohol. Drinking lowers your chances of successfully quitting.  Reduce the amount of caffeine you consume. Once you quit smoking, the amount of caffeine in your body increases and can give you symptoms, such as a rapid heartbeat, sweating, and anxiety.  Avoid smokers because they can make you want to smoke.  Do not let weight gain distract you. Many smokers will gain weight when they quit, usually less than 10 pounds. Eat a healthy diet and stay active. You can always lose the weight gained after you quit.  Find ways to improve your mood other than smoking. FOR MORE INFORMATION  www.smokefree.gov  Document Released: 11/16/2001 Document Revised:  04/08/2014 Document Reviewed: 03/02/2012 [facility] Patient Information 2015 [place], [place]. This information is not intended to replace advice given to you by your health care provider. Make sure you discuss any questions you have with your health care provider.   Your physician wants you to follow-up in: 1 year with Dr Burt Knack. (January 2017). You will receive a reminder letter in the mail two months in advance. If you don't receive a letter, please call our office to schedule the follow-up appointment.   Marland Kitchen

## 2014-12-24 ENCOUNTER — Ambulatory Visit (INDEPENDENT_AMBULATORY_CARE_PROVIDER_SITE_OTHER): Payer: 59 | Admitting: Internal Medicine

## 2014-12-24 ENCOUNTER — Encounter: Payer: Self-pay | Admitting: Internal Medicine

## 2014-12-24 VITALS — BP 136/86 | HR 96 | Temp 98.1°F | Wt 202.0 lb

## 2014-12-24 DIAGNOSIS — F411 Generalized anxiety disorder: Secondary | ICD-10-CM

## 2014-12-24 DIAGNOSIS — J209 Acute bronchitis, unspecified: Secondary | ICD-10-CM

## 2014-12-24 MED ORDER — CLARITHROMYCIN 500 MG PO TABS: 500.0000 mg | ORAL_TABLET | Freq: Two times a day (BID) | ORAL | Status: DC

## 2014-12-24 NOTE — Progress Notes (Signed)
   Subjective:    Patient ID: [name], female    DOB: Sep 18, 1966, 49 y.o.   MRN: [id number]  HPI Patient with history of coronary artery disease status post MI continues to smoke. Has had a cough for one week. No fever or shaking chills. 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 This caused considerable amount of anxiety because she's not done that work in some time. Currently working in [employer] which has been undergoing remodeling.  No fever or shaking chills. Had flu vaccine through employment.    Review of Systems     Objective:   Physical Exam  Skin warm and dry. Nodes none. TMs are clear. Neck is supple without adenopathy. Chest clear to auscultation without rales or wheezing      Assessment & Plan:  Bronchitis  Plan: Biaxin 500 mg twice daily for 10 days. Note to be out of work through Friday, January 22.

## 2014-12-24 NOTE — Patient Instructions (Signed)
Take Biaxin 500 mg twice daily for 10 days. Out of work through January 22

## 2015-01-02 ENCOUNTER — Other Ambulatory Visit: Payer: 59 | Admitting: Internal Medicine

## 2015-01-03 ENCOUNTER — Ambulatory Visit: Payer: 59 | Admitting: Internal Medicine

## 2015-01-16 ENCOUNTER — Other Ambulatory Visit: Payer: Self-pay | Admitting: Internal Medicine

## 2015-01-16 DIAGNOSIS — N6459 Other signs and symptoms in breast: Secondary | ICD-10-CM

## 2015-01-23 ENCOUNTER — Other Ambulatory Visit: Payer: 59 | Admitting: Internal Medicine

## 2015-01-23 DIAGNOSIS — Z79899 Other long term (current) drug therapy: Secondary | ICD-10-CM

## 2015-01-23 DIAGNOSIS — Z13220 Encounter for screening for lipoid disorders: Secondary | ICD-10-CM

## 2015-01-23 LAB — LIPID PANEL
Cholesterol: 130 mg/dL (ref 0–200)
HDL: 50 mg/dL (ref >39)
LDL Cholesterol: 58 mg/dL (ref 0–99)
Total CHOL/HDL Ratio: 2.6 Ratio
Triglycerides: 110 mg/dL (ref <150)
VLDL: 22 mg/dL (ref 0–40)

## 2015-01-23 LAB — HEPATIC FUNCTION PANEL
ALT: 38 U/L — ABNORMAL HIGH (ref 0–35)
AST: 31 U/L (ref 0–37)
Albumin: 4.3 g/dL (ref 3.5–5.2)
Alkaline Phosphatase: 91 U/L (ref 39–117)
Bilirubin, Direct: 0.1 mg/dL (ref 0.0–0.3)
Indirect Bilirubin: 0.4 mg/dL (ref 0.2–1.2)
Total Bilirubin: 0.5 mg/dL (ref 0.2–1.2)
Total Protein: 6.5 g/dL (ref 6.0–8.3)

## 2015-01-24 ENCOUNTER — Encounter: Payer: Self-pay | Admitting: Internal Medicine

## 2015-01-24 ENCOUNTER — Ambulatory Visit
Admission: RE | Admit: 2015-01-24 | Discharge: 2015-01-24 | Disposition: A | Discharge status: Home / Self Care | Payer: 59 | Source: Ambulatory Visit | Attending: Internal Medicine | Admitting: Internal Medicine

## 2015-01-24 ENCOUNTER — Ambulatory Visit (INDEPENDENT_AMBULATORY_CARE_PROVIDER_SITE_OTHER): Payer: 59 | Admitting: Internal Medicine

## 2015-01-24 VITALS — BP 132/82 | HR 86 | Temp 97.1°F | Wt 204.0 lb

## 2015-01-24 DIAGNOSIS — R7302 Impaired glucose tolerance (oral): Secondary | ICD-10-CM

## 2015-01-24 DIAGNOSIS — N6459 Other signs and symptoms in breast: Secondary | ICD-10-CM

## 2015-01-24 DIAGNOSIS — Z8659 Personal history of other mental and behavioral disorders: Secondary | ICD-10-CM

## 2015-01-24 DIAGNOSIS — E785 Hyperlipidemia, unspecified: Secondary | ICD-10-CM

## 2015-01-24 DIAGNOSIS — I10 Essential (primary) hypertension: Secondary | ICD-10-CM

## 2015-01-24 DIAGNOSIS — K760 Fatty (change of) liver, not elsewhere classified: Secondary | ICD-10-CM | POA: Diagnosis not present

## 2015-01-24 DIAGNOSIS — Z8679 Personal history of other diseases of the circulatory system: Secondary | ICD-10-CM | POA: Diagnosis not present

## 2015-01-24 DIAGNOSIS — R748 Abnormal levels of other serum enzymes: Secondary | ICD-10-CM

## 2015-01-24 DIAGNOSIS — Z658 Other specified problems related to psychosocial circumstances: Secondary | ICD-10-CM

## 2015-01-24 DIAGNOSIS — Z87891 Personal history of nicotine dependence: Secondary | ICD-10-CM

## 2015-01-24 DIAGNOSIS — Z720 Tobacco use: Secondary | ICD-10-CM | POA: Diagnosis not present

## 2015-01-24 DIAGNOSIS — I252 Old myocardial infarction: Secondary | ICD-10-CM

## 2015-01-24 DIAGNOSIS — F439 Reaction to severe stress, unspecified: Secondary | ICD-10-CM

## 2015-01-31 ENCOUNTER — Telehealth: Payer: Self-pay | Admitting: Internal Medicine

## 2015-01-31 MED ORDER — CLARITHROMYCIN 500 MG PO TABS: 500.0000 mg | ORAL_TABLET | Freq: Two times a day (BID) | ORAL | Status: DC

## 2015-01-31 NOTE — Telephone Encounter (Signed)
Call in Biaxin 500 mg twice daily  For 10 days

## 2015-01-31 NOTE — Telephone Encounter (Signed)
Patient notified script sent in.

## 2015-01-31 NOTE — Telephone Encounter (Signed)
When she was here last week, states that you all talked about the fact that her throat was a tad scratchy and you told her to call back if she needed to.  She is now coughing up yellow phlegm, low-grade fever.  States every in her house been sick.  So, she now has what she thinks is a sinus infection.  She is NOT wheezing, but does have congestion and yellow/blood tinged phlegm.  Wants to know if you will go ahead and call her something in to the OP pharmacy?    Pharmacy:  [facility] ([place])

## 2015-02-01 ENCOUNTER — Encounter: Payer: Self-pay | Admitting: Internal Medicine

## 2015-02-01 NOTE — Progress Notes (Signed)
   Subjective:    Patient ID: [name], female    DOB: 1966-08-06, 49 y.o.   MRN: [id number]  HPI 49 year old female with history of heart disease, smoking, hypothyroidism, musculoskeletal pain, GE reflux, anxiety depression in today for follow-up. Mother died several months ago   after an apparent acute respiratory arrest.still has grief reaction. Has not been able to quit smoking despite cardiologist and myself telling her to do so. She has a history of MI. History of hypothyroidism and TSH is within normal limits. Has mild elevation of liver functions today and mild elevation of LDL at 110. This is going to need follow-up. 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     Review of Systems     Objective:   Physical Exam   Neck is supple without JVD thyromegaly or carotid bruits. Chest clear to auscultation. Cardiac exam regular rate and rhythm. Extremities without edema      Assessment & Plan:   Coronary artery disease-followed by cardiologist  History of smoking-once again told patient quit smoking  Anxiety depression treated with SSRI and anti-anxiety medication  Grief reaction secondary to losing mother  Hypothyroidism-stable  Elevated LDL cholesterol at 110 on Lipitor 40 mg daily  Plan: Return in February for follow-up on lipids with office visit

## 2015-02-01 NOTE — Patient Instructions (Signed)
Return in February for follow-up on lipids. Please stop smoking. Try to diet and exercise.

## 2015-02-19 ENCOUNTER — Other Ambulatory Visit: Payer: Self-pay | Admitting: Cardiovascular Disease

## 2015-03-27 ENCOUNTER — Other Ambulatory Visit: Payer: Self-pay | Admitting: *Deleted

## 2015-03-27 ENCOUNTER — Telehealth: Payer: Self-pay | Admitting: Internal Medicine

## 2015-03-27 DIAGNOSIS — M7918 Myalgia, other site: Secondary | ICD-10-CM

## 2015-03-27 MED ORDER — HYDROCODONE-ACETAMINOPHEN 5-325 MG PO TABS
1.0000 | ORAL_TABLET | Freq: Two times a day (BID) | ORAL | Status: DC | PRN
Start: 2015-03-27 — End: 2015-05-27

## 2015-03-27 NOTE — Telephone Encounter (Signed)
Needs refill Rx on her Vicodin 5-325 mg.  She takes 1 tablet bid as needed for pain.  Advised we would call her when Rx is ready to pick up.

## 2015-03-27 NOTE — Telephone Encounter (Signed)
Refill for Hydrocodone printed .

## 2015-03-27 NOTE — Telephone Encounter (Signed)
Please refill once 

## 2015-03-27 NOTE — Telephone Encounter (Signed)
Advised patient that Rx is ready for pick up.

## 2015-03-30 DIAGNOSIS — R7302 Impaired glucose tolerance (oral): Secondary | ICD-10-CM | POA: Insufficient documentation

## 2015-03-30 NOTE — Progress Notes (Signed)
   Subjective:    Patient ID: [name], female    DOB: Nov 08, 1966, 49 y.o.   MRN: [id number]  HPI Treated for bronchitis in January. History of hypertension and hypothyroidism. Continues to smoke. History of coronary artery disease. History of elevated liver functions presumably due to fatty liver infiltration. SGPT has decreased from 57 to 38.  SGOT has in improved from 41-31. However LDL cholesterol is is elevated at 100 and previously was normal. Patient continues to grieve the loss of mother. Has situational stress. Job is stressful.    Review of Systems     Objective:   Physical Exam  Skin warm and dry. Nodes none. Chest clear. Cardiac exam regular rate and rhythm. Extremities without edema      Assessment & Plan:  Coronary artery disease  History smoking  Fatty liver infiltration with elevated liver enzymes  Hyperlipidemia  Plan: Return in July for physical examination. Please take better care of yourself. Please quit smoking. Diet exercise and weight loss.

## 2015-03-30 NOTE — Patient Instructions (Signed)
Please take care of yourself. Diet exercise and weight loss are most important with your history. Please stop smoking.

## 2015-05-22 ENCOUNTER — Other Ambulatory Visit: Payer: Self-pay | Admitting: Internal Medicine

## 2015-05-27 ENCOUNTER — Other Ambulatory Visit: Payer: Self-pay | Admitting: *Deleted

## 2015-05-27 ENCOUNTER — Telehealth: Payer: Self-pay | Admitting: Internal Medicine

## 2015-05-27 DIAGNOSIS — M7918 Myalgia, other site: Secondary | ICD-10-CM

## 2015-05-27 MED ORDER — HYDROCODONE-ACETAMINOPHEN 5-325 MG PO TABS: 1.0000 | ORAL_TABLET | Freq: Two times a day (BID) | ORAL | Status: DC | PRN

## 2015-05-27 NOTE — Telephone Encounter (Signed)
Needs refill on her Norco 5-325 mg.  Was advised by her pharmacy that she needs appointment scheduled to see you.  She has appointment scheduled in July.    Please call when ready for pick up.

## 2015-05-27 NOTE — Telephone Encounter (Signed)
Refill once 

## 2015-05-27 NOTE — Telephone Encounter (Signed)
Refilled hydrocodone 

## 2015-06-16 ENCOUNTER — Other Ambulatory Visit: Payer: 59 | Admitting: Internal Medicine

## 2015-06-16 DIAGNOSIS — Z0000 Encounter for general adult medical examination without abnormal findings: Secondary | ICD-10-CM

## 2015-06-16 DIAGNOSIS — Z1329 Encounter for screening for other suspected endocrine disorder: Secondary | ICD-10-CM

## 2015-06-16 DIAGNOSIS — Z1321 Encounter for screening for nutritional disorder: Secondary | ICD-10-CM

## 2015-06-16 DIAGNOSIS — Z13220 Encounter for screening for lipoid disorders: Secondary | ICD-10-CM

## 2015-06-16 DIAGNOSIS — Z130 Encounter for screening for diseases of the blood and blood-forming organs and certain disorders involving the immune mechanism: Secondary | ICD-10-CM

## 2015-06-16 DIAGNOSIS — R7309 Other abnormal glucose: Secondary | ICD-10-CM

## 2015-06-16 LAB — COMPLETE METABOLIC PANEL WITH GFR
ALT: 29 U/L (ref 0–35)
AST: 24 U/L (ref 0–37)
Albumin: 4 g/dL (ref 3.5–5.2)
Alkaline Phosphatase: 78 U/L (ref 39–117)
BUN: 11 mg/dL (ref 6–23)
CO2: 26 mEq/L (ref 19–32)
Calcium: 9.1 mg/dL (ref 8.4–10.5)
Chloride: 106 mEq/L (ref 96–112)
Creat: 0.74 mg/dL (ref 0.50–1.10)
GFR, Est African American: >89 mL/min
GFR, Est Non African American: >89 mL/min
Glucose, Bld: 101 mg/dL — ABNORMAL HIGH (ref 70–99)
Potassium: 4.5 mEq/L (ref 3.5–5.3)
Sodium: 142 mEq/L (ref 135–145)
Total Bilirubin: 0.4 mg/dL (ref 0.2–1.2)
Total Protein: 6.1 g/dL (ref 6.0–8.3)

## 2015-06-16 LAB — HEMOGLOBIN A1C
Hgb A1c MFr Bld: 6 % — ABNORMAL HIGH (ref <5.7)
Mean Plasma Glucose: 126 mg/dL — ABNORMAL HIGH (ref <117)

## 2015-06-16 LAB — LIPID PANEL
Cholesterol: 122 mg/dL (ref 0–200)
HDL: 49 mg/dL (ref >=46)
LDL Cholesterol: 55 mg/dL (ref 0–99)
Total CHOL/HDL Ratio: 2.5 Ratio
Triglycerides: 91 mg/dL (ref <150)
VLDL: 18 mg/dL (ref 0–40)

## 2015-06-16 LAB — TSH: TSH: 1.145 u[IU]/mL (ref 0.350–4.500)

## 2015-06-17 LAB — VITAMIN D 25 HYDROXY (VIT D DEFICIENCY, FRACTURES): Vit D, 25-Hydroxy: 48 ng/mL (ref 30–100)

## 2015-06-17 LAB — CBC WITH DIFFERENTIAL/PLATELET
Basophils Absolute: 0 10*3/uL (ref 0.0–0.1)
Basophils Relative: 0 % (ref 0–1)
Eosinophils Absolute: 0.2 10*3/uL (ref 0.0–0.7)
Eosinophils Relative: 3 % (ref 0–5)
HCT: 46.2 % — ABNORMAL HIGH (ref 36.0–46.0)
Hemoglobin: 15.1 g/dL — ABNORMAL HIGH (ref 12.0–15.0)
Lymphocytes Relative: 31 % (ref 12–46)
Lymphs Abs: 2.2 10*3/uL (ref 0.7–4.0)
MCH: 30.9 pg (ref 26.0–34.0)
MCHC: 32.7 g/dL (ref 30.0–36.0)
MCV: 94.7 fL (ref 78.0–100.0)
MPV: 11.7 fL (ref 8.6–12.4)
Monocytes Absolute: 0.5 10*3/uL (ref 0.1–1.0)
Monocytes Relative: 7 % (ref 3–12)
Neutro Abs: 4.2 10*3/uL (ref 1.7–7.7)
Neutrophils Relative %: 59 % (ref 43–77)
Platelets: 192 10*3/uL (ref 150–400)
RBC: 4.88 MIL/uL (ref 3.87–5.11)
RDW: 13.1 % (ref 11.5–15.5)
WBC: 7.2 10*3/uL (ref 4.0–10.5)

## 2015-06-20 ENCOUNTER — Ambulatory Visit (INDEPENDENT_AMBULATORY_CARE_PROVIDER_SITE_OTHER): Payer: 59 | Admitting: Internal Medicine

## 2015-06-20 ENCOUNTER — Encounter: Payer: Self-pay | Admitting: Internal Medicine

## 2015-06-20 VITALS — BP 124/80 | HR 72 | Temp 98.0°F | Ht 65.0 in | Wt 204.0 lb

## 2015-06-20 DIAGNOSIS — K219 Gastro-esophageal reflux disease without esophagitis: Secondary | ICD-10-CM

## 2015-06-20 DIAGNOSIS — E669 Obesity, unspecified: Secondary | ICD-10-CM

## 2015-06-20 DIAGNOSIS — I10 Essential (primary) hypertension: Secondary | ICD-10-CM | POA: Diagnosis not present

## 2015-06-20 DIAGNOSIS — M791 Myalgia: Secondary | ICD-10-CM | POA: Diagnosis not present

## 2015-06-20 DIAGNOSIS — R7302 Impaired glucose tolerance (oral): Secondary | ICD-10-CM | POA: Diagnosis not present

## 2015-06-20 DIAGNOSIS — Z720 Tobacco use: Secondary | ICD-10-CM | POA: Diagnosis not present

## 2015-06-20 DIAGNOSIS — I519 Heart disease, unspecified: Secondary | ICD-10-CM

## 2015-06-20 DIAGNOSIS — F17200 Nicotine dependence, unspecified, uncomplicated: Secondary | ICD-10-CM

## 2015-06-20 DIAGNOSIS — E8881 Metabolic syndrome: Secondary | ICD-10-CM

## 2015-06-20 DIAGNOSIS — Z0000 Encounter for general adult medical examination without abnormal findings: Secondary | ICD-10-CM

## 2015-06-20 DIAGNOSIS — G4733 Obstructive sleep apnea (adult) (pediatric): Secondary | ICD-10-CM | POA: Diagnosis not present

## 2015-06-20 DIAGNOSIS — G8929 Other chronic pain: Secondary | ICD-10-CM

## 2015-06-20 DIAGNOSIS — Z8709 Personal history of other diseases of the respiratory system: Secondary | ICD-10-CM

## 2015-06-20 DIAGNOSIS — E039 Hypothyroidism, unspecified: Secondary | ICD-10-CM

## 2015-06-20 DIAGNOSIS — M7918 Myalgia, other site: Secondary | ICD-10-CM

## 2015-06-20 DIAGNOSIS — E785 Hyperlipidemia, unspecified: Secondary | ICD-10-CM | POA: Diagnosis not present

## 2015-06-20 LAB — POCT URINALYSIS DIPSTICK
Bilirubin, UA: NEGATIVE
Blood, UA: NEGATIVE
Glucose, UA: NEGATIVE
Ketones, UA: NEGATIVE
Leukocytes, UA: NEGATIVE
Nitrite, UA: NEGATIVE
Protein, UA: NEGATIVE
Spec Grav, UA: 1.01
Urobilinogen, UA: NEGATIVE
pH, UA: 6.5

## 2015-06-20 MED ORDER — LEVOTHYROXINE SODIUM 75 MCG PO TABS: 75.0000 ug | ORAL_TABLET | Freq: Every day | ORAL | Status: DC

## 2015-06-20 NOTE — Patient Instructions (Addendum)
Continue same meds and return in 6 months. Please stop smoking. Please try to diet and exercise.

## 2015-07-06 NOTE — Progress Notes (Signed)
Subjective:    Patient ID: [name], female    DOB: 1966/09/29, 49 y.o.   MRN: [id number]  HPI 49 year old Female in today for health maintenance exam and evaluation of multiple medical issues. Unfortunately continues to smoke despite having had an MI. We've had multiple conversations about smoking cessation. 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 Had a subtotal occlusion of a very small branch of the diagonal and no other significant disease. She underwent urgent cardiac catheterization and did well.  Had very serious case of at bedtime purpura October 2006. Hemoglobin dropped to 9.4 g. She developed purpuric lesions on her lower extremities and maroon-colored stools. CT scan showed diffuse colitis and she developed ascites. Was transferred to [facility] after developing T-colored urine with hematuria. 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 Was treated with high-dose steroids which she remained on for several months. Was treated by Dr. Marlana Salvage at [facility] in the rheumatology department.  In September 2009 she was hospitalized for gastroenteritis secondary to Clostridium difficile.  During hospitalization in 2006 she had a 2-D echocardiogram that was normal. In 2009 she had a normal cardiac nuclear study.  History of sleep apnea. She is not on C Pap. Weight loss has been recommended for sleep apnea. Sleep study showed desaturation to 87% with snoring. History of migraine headaches. History of cervical radiculopathy 2012. History of an osteophyte causing some mild foraminal stenosis and impingement on MRI which has been evaluated by Dr. Vertell Limber.  For sometime she was maintained on Adderall for attention deficit disorder but this was discontinued after MI.  History of endometrial polyp causing menorrhagia removed by Dr. Sander Radon November 2003. Has a searing sections 1995, [place]. Appendectomy in the early 1970s. No history of fractures.  Social  history: Married with 3 sons. Does not consume alcohol. He smoked half pack cigarettes daily for well over 20 years.  Family history: Both parents with hypertension. Father with history of stroke. Mother passed away with an acute respiratory arrest. 2 brothers in good health. No sisters.     Review of Systems  Constitutional: Positive for fatigue.  Musculoskeletal: Positive for arthralgias.       Objective:   Physical Exam  Constitutional: She is oriented to person, place, and time. No distress.  HENT:  Head: Normocephalic.  Right Ear: External ear normal.  Left Ear: External ear normal.  Mouth/Throat: No oropharyngeal exudate.  Eyes: Conjunctivae and EOM are normal. Right eye exhibits no discharge. No scleral icterus.  Neck: Neck supple. No JVD present. No thyromegaly present.  Cardiovascular: Normal rate, regular rhythm, normal heart sounds and intact distal pulses.   No murmur heard. Pulmonary/Chest: Effort normal and breath sounds normal. She has no wheezes. She has no rales.  Abdominal: Soft. Bowel sounds are normal. There is no rebound.  Genitourinary:  Deferred to Dr. Nori Riis  Musculoskeletal: She exhibits no edema.  Neurological: She is alert and oriented to person, place, and time. She has normal reflexes. No cranial nerve deficit. Coordination normal.  Skin: Skin is dry. No rash noted. She is not diaphoretic.  Psychiatric: She has a normal mood and affect. Her behavior is normal. Judgment and thought content normal.  Vitals reviewed.         Assessment & Plan:  Coronary artery disease followed by cardiology status post anterior MI December 2013  Essential hypertension  Hypothyroidism  History of smoking-reminded once again to quit  History of sleep apnea  Anxiety  depression  Musculoskeletal pain tree sparingly but hydrocodone/APAP  History of asthma/reactive airways disease related to acute upper respiratory infections  GE reflux  Obesity  Plan:  Anxiety depression treated with SSRI. Continue follow-up with cardiology. Please quit smoking. Recommend annual mammogram. Received flu vaccine through employment. Continue on statin medication status post MI. Return in 6 months. Hypertension stable on current regimen. TSH is within normal limits. Please try to diet and exercise.

## 2015-07-16 ENCOUNTER — Other Ambulatory Visit: Payer: Self-pay | Admitting: Obstetrics & Gynecology

## 2015-07-17 LAB — CYTOLOGY - PAP

## 2015-08-18 ENCOUNTER — Telehealth: Payer: Self-pay | Admitting: Internal Medicine

## 2015-08-18 DIAGNOSIS — M7918 Myalgia, other site: Secondary | ICD-10-CM

## 2015-08-18 MED ORDER — HYDROCODONE-ACETAMINOPHEN 5-325 MG PO TABS: 1.0000 | ORAL_TABLET | Freq: Two times a day (BID) | ORAL | Status: DC | PRN

## 2015-08-18 NOTE — Telephone Encounter (Signed)
Patient calling for refill on her Norco 5-325 please.  Last filled in June.   Please call when ready to pick up.   Thanks.

## 2015-08-18 NOTE — Telephone Encounter (Signed)
Refill once 

## 2015-08-22 NOTE — Telephone Encounter (Signed)
Called patient on 08/21/15 to advise that her script is ready to be picked up.

## 2015-08-27 ENCOUNTER — Other Ambulatory Visit: Payer: Self-pay | Admitting: *Deleted

## 2015-08-27 MED ORDER — DESVENLAFAXINE SUCCINATE ER 50 MG PO TB24: 50.0000 mg | ORAL_TABLET | Freq: Every day | ORAL | Status: DC

## 2015-10-10 ENCOUNTER — Ambulatory Visit (INDEPENDENT_AMBULATORY_CARE_PROVIDER_SITE_OTHER): Payer: 59 | Admitting: Internal Medicine

## 2015-10-10 ENCOUNTER — Encounter: Payer: Self-pay | Admitting: Internal Medicine

## 2015-10-10 VITALS — BP 136/88 | HR 75 | Temp 97.6°F | Resp 18 | Ht 64.0 in | Wt 206.0 lb

## 2015-10-10 DIAGNOSIS — J0120 Acute ethmoidal sinusitis, unspecified: Secondary | ICD-10-CM

## 2015-10-10 MED ORDER — METHYLPREDNISOLONE ACETATE 80 MG/ML IJ SUSP
80.0000 mg | Freq: Once | INTRAMUSCULAR | Status: AC
  Administered 2015-10-10: 80 mg via INTRAMUSCULAR

## 2015-10-10 MED ORDER — HYDROCODONE-HOMATROPINE 5-1.5 MG/5ML PO SYRP: 5.0000 mL | ORAL_SOLUTION | Freq: Three times a day (TID) | ORAL | Status: DC | PRN

## 2015-10-10 MED ORDER — CLARITHROMYCIN 500 MG PO TABS: 500.0000 mg | ORAL_TABLET | Freq: Two times a day (BID) | ORAL | Status: DC

## 2015-10-10 NOTE — Progress Notes (Signed)
   Subjective:    Patient ID: [name], female    DOB: 1966-09-04, 49 y.o.   MRN: [id number]  HPI Slight scratchy throat, cough and congestion. Continues to smoke. No fever or chills. No wheezing. No ear pain.    Review of Systems as above. No new complaints.     Objective:   Physical Exam  TMs obscured by cerumen. Pharynx very slightly injected. Neck is supple without significant adenopathy. Chest clear to auscultation without rales or wheezing.      Assessment & Plan:  Acute URI  Plan: Biaxin 500 mg twice daily for 10 days. Do not take statin while on Biaxin. Hycodan 1 teaspoon by mouth every 8 hours when necessary cough. Depo-Medrol 80 mg IM.

## 2015-10-29 ENCOUNTER — Other Ambulatory Visit: Payer: Self-pay | Admitting: Internal Medicine

## 2015-11-18 ENCOUNTER — Other Ambulatory Visit: Payer: Self-pay | Admitting: Gastroenterology

## 2015-11-19 ENCOUNTER — Telehealth: Payer: Self-pay | Admitting: Internal Medicine

## 2015-11-19 DIAGNOSIS — M7918 Myalgia, other site: Secondary | ICD-10-CM

## 2015-11-19 MED ORDER — HYDROCODONE-ACETAMINOPHEN 5-325 MG PO TABS: 1.0000 | ORAL_TABLET | Freq: Two times a day (BID) | ORAL | Status: DC | PRN

## 2015-11-19 NOTE — Telephone Encounter (Signed)
Refill entered and patient notified

## 2015-11-19 NOTE — Telephone Encounter (Signed)
Refill once 

## 2015-11-19 NOTE — Telephone Encounter (Signed)
Calling for a refill on her Norco/Vicodin 5-325 mg.  Last filled 08/18/15.    Advised we would call her when ready for pick-up.  Patient would like [place] to call her on her cell phone please.  Thanks.

## 2015-12-19 ENCOUNTER — Other Ambulatory Visit: Payer: Self-pay | Admitting: Internal Medicine

## 2015-12-23 ENCOUNTER — Other Ambulatory Visit: Payer: 59 | Admitting: Internal Medicine

## 2015-12-23 DIAGNOSIS — E785 Hyperlipidemia, unspecified: Secondary | ICD-10-CM

## 2015-12-23 DIAGNOSIS — R7309 Other abnormal glucose: Secondary | ICD-10-CM

## 2015-12-23 DIAGNOSIS — E8881 Metabolic syndrome: Secondary | ICD-10-CM

## 2015-12-23 DIAGNOSIS — E039 Hypothyroidism, unspecified: Secondary | ICD-10-CM | POA: Diagnosis not present

## 2015-12-23 DIAGNOSIS — Z79899 Other long term (current) drug therapy: Secondary | ICD-10-CM

## 2015-12-23 DIAGNOSIS — I10 Essential (primary) hypertension: Secondary | ICD-10-CM

## 2015-12-23 DIAGNOSIS — R7302 Impaired glucose tolerance (oral): Secondary | ICD-10-CM | POA: Diagnosis not present

## 2015-12-23 LAB — LIPID PANEL
Cholesterol: 130 mg/dL (ref 125–200)
HDL: 50 mg/dL (ref >=46)
LDL Cholesterol: 59 mg/dL (ref <130)
Total CHOL/HDL Ratio: 2.6 Ratio (ref <=5.0)
Triglycerides: 106 mg/dL (ref <150)
VLDL: 21 mg/dL (ref <30)

## 2015-12-23 LAB — HEPATIC FUNCTION PANEL
ALT: 29 U/L (ref 6–29)
AST: 23 U/L (ref 10–35)
Albumin: 3.7 g/dL (ref 3.6–5.1)
Alkaline Phosphatase: 65 U/L (ref 33–130)
Bilirubin, Direct: 0.1 mg/dL (ref <=0.2)
Indirect Bilirubin: 0.5 mg/dL (ref 0.2–1.2)
Total Bilirubin: 0.6 mg/dL (ref 0.2–1.2)
Total Protein: 5.7 g/dL — ABNORMAL LOW (ref 6.1–8.1)

## 2015-12-23 LAB — TSH: TSH: 1.421 u[IU]/mL (ref 0.350–4.500)

## 2015-12-24 LAB — HEMOGLOBIN A1C
Hgb A1c MFr Bld: 5.9 % — ABNORMAL HIGH (ref <5.7)
Mean Plasma Glucose: 123 mg/dL — ABNORMAL HIGH (ref <117)

## 2015-12-25 ENCOUNTER — Encounter: Payer: Self-pay | Admitting: Internal Medicine

## 2015-12-25 ENCOUNTER — Ambulatory Visit (INDEPENDENT_AMBULATORY_CARE_PROVIDER_SITE_OTHER): Payer: 59 | Admitting: Internal Medicine

## 2015-12-25 ENCOUNTER — Other Ambulatory Visit: Payer: 59 | Admitting: Internal Medicine

## 2015-12-25 VITALS — BP 104/70 | HR 77 | Temp 97.5°F | Resp 20 | Ht 64.0 in | Wt 204.0 lb

## 2015-12-25 DIAGNOSIS — Z720 Tobacco use: Secondary | ICD-10-CM | POA: Diagnosis not present

## 2015-12-25 DIAGNOSIS — R7302 Impaired glucose tolerance (oral): Secondary | ICD-10-CM | POA: Diagnosis not present

## 2015-12-25 DIAGNOSIS — E8881 Metabolic syndrome: Secondary | ICD-10-CM

## 2015-12-25 DIAGNOSIS — K760 Fatty (change of) liver, not elsewhere classified: Secondary | ICD-10-CM | POA: Diagnosis not present

## 2015-12-25 DIAGNOSIS — F418 Other specified anxiety disorders: Secondary | ICD-10-CM

## 2015-12-25 DIAGNOSIS — K635 Polyp of colon: Secondary | ICD-10-CM

## 2015-12-25 DIAGNOSIS — I519 Heart disease, unspecified: Secondary | ICD-10-CM | POA: Diagnosis not present

## 2015-12-25 DIAGNOSIS — I252 Old myocardial infarction: Secondary | ICD-10-CM | POA: Diagnosis not present

## 2015-12-25 DIAGNOSIS — M791 Myalgia: Secondary | ICD-10-CM | POA: Diagnosis not present

## 2015-12-25 DIAGNOSIS — E669 Obesity, unspecified: Secondary | ICD-10-CM | POA: Diagnosis not present

## 2015-12-25 DIAGNOSIS — F32A Depression, unspecified: Secondary | ICD-10-CM

## 2015-12-25 DIAGNOSIS — F329 Major depressive disorder, single episode, unspecified: Secondary | ICD-10-CM

## 2015-12-25 DIAGNOSIS — F419 Anxiety disorder, unspecified: Secondary | ICD-10-CM

## 2015-12-25 DIAGNOSIS — G8929 Other chronic pain: Secondary | ICD-10-CM

## 2015-12-25 DIAGNOSIS — I10 Essential (primary) hypertension: Secondary | ICD-10-CM

## 2015-12-25 DIAGNOSIS — E785 Hyperlipidemia, unspecified: Secondary | ICD-10-CM

## 2015-12-25 DIAGNOSIS — M7918 Myalgia, other site: Secondary | ICD-10-CM

## 2015-12-25 DIAGNOSIS — Z87891 Personal history of nicotine dependence: Secondary | ICD-10-CM

## 2015-12-25 MED ORDER — ALPRAZOLAM 0.25 MG PO TABS: 0.2500 mg | ORAL_TABLET | Freq: Every day | ORAL | Status: DC | PRN

## 2015-12-26 ENCOUNTER — Ambulatory Visit: Payer: 59 | Admitting: Internal Medicine

## 2015-12-31 ENCOUNTER — Other Ambulatory Visit: Payer: Self-pay

## 2015-12-31 DIAGNOSIS — Z1231 Encounter for screening mammogram for malignant neoplasm of breast: Secondary | ICD-10-CM

## 2016-01-12 ENCOUNTER — Other Ambulatory Visit: Payer: Self-pay | Admitting: Cardiovascular Disease

## 2016-01-23 ENCOUNTER — Telehealth: Payer: Self-pay | Admitting: Internal Medicine

## 2016-01-23 DIAGNOSIS — M7918 Myalgia, other site: Secondary | ICD-10-CM

## 2016-01-23 MED ORDER — HYDROCODONE-ACETAMINOPHEN 5-325 MG PO TABS: 1.0000 | ORAL_TABLET | Freq: Two times a day (BID) | ORAL | Status: DC | PRN

## 2016-01-23 NOTE — Telephone Encounter (Signed)
Refill once 

## 2016-01-23 NOTE — Telephone Encounter (Signed)
This was printed for signature

## 2016-01-23 NOTE — Telephone Encounter (Signed)
Calling for a refill on her Hydrocodone 5-325.  She is prescribed take 1 tablet twice daily as needed.  Last filled 11/19/15.    Advised patient we will call her when ready for her to pick up Rx.

## 2016-02-03 NOTE — Patient Instructions (Signed)
Please work on diet exercise and weight loss. Please quit smoking. Continue same medications and return in 6 months for physical exam.

## 2016-02-03 NOTE — Progress Notes (Signed)
   Subjective:    Patient ID: [name], female    DOB: [birth year], 50 y.o.   MRN: [id number]  HPI 50 year old Female in today to follow-up on essential hypertension, hyperlipidemia, metabolic syndrome, history of coronary artery disease status post MI, history of smoking, obesity, anxiety depression, chronic musculoskeletal pain. Takes hydrocodone/APAP sparingly for musculoskeletal pain. 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 Children are doing well. Has not been able to quit smoking. History of hypothyroidism. History of GE reflux. Remote history of severe case of HS purpura requiring treatment at [facility]. Hx of fatty liver infiltration and elevated liver enzymes due to fatty liver. History of impaired glucose tolerance with recent hemoglobin A1c 5.9%.  Had colonoscopy by Dr. Cristina Gong December 2016 showing hyperplastic polyps.    Review of Systems     Objective:   Physical Exam  Neck is supple without JVD thyromegaly or carotid bruits. Chest clear to auscultation. Cardiac exam regular rate and rhythm normal S1 and S2. Extremities without edema.      Assessment & Plan:  Essential hypertension  Hyperlipidemia  Metabolic syndrome  Obesity  History of coronary artery disease status post MI  History of smoking  Anxiety depression  Chronic musculoskeletal pain  History of fatty liver infiltration and elevated liver enzymes  Impaired glucose tolerance  Hyperplastic polyps  Plan: Continue same medications. Please work on diet exercise and weight loss. Please quit smoking. Return in 6 months.

## 2016-02-04 ENCOUNTER — Ambulatory Visit
Admission: RE | Admit: 2016-02-04 | Discharge: 2016-02-04 | Disposition: A | Discharge status: Home / Self Care | Payer: 59 | Source: Ambulatory Visit

## 2016-02-04 DIAGNOSIS — Z1231 Encounter for screening mammogram for malignant neoplasm of breast: Secondary | ICD-10-CM

## 2016-03-01 ENCOUNTER — Ambulatory Visit (INDEPENDENT_AMBULATORY_CARE_PROVIDER_SITE_OTHER): Payer: 59 | Admitting: Cardiovascular Disease

## 2016-03-01 ENCOUNTER — Encounter: Payer: Self-pay | Admitting: Cardiovascular Disease

## 2016-03-01 VITALS — BP 112/70 | HR 76 | Ht 65.0 in | Wt 202.4 lb

## 2016-03-01 DIAGNOSIS — I10 Essential (primary) hypertension: Secondary | ICD-10-CM | POA: Diagnosis not present

## 2016-03-01 DIAGNOSIS — I2510 Atherosclerotic heart disease of native coronary artery without angina pectoris: Secondary | ICD-10-CM | POA: Diagnosis not present

## 2016-03-01 MED ORDER — NITROGLYCERIN 0.4 MG SL SUBL: 0.4000 mg | SUBLINGUAL_TABLET | SUBLINGUAL | Status: DC | PRN

## 2016-03-01 MED ORDER — AMLODIPINE BESYLATE 5 MG PO TABS: 5.0000 mg | ORAL_TABLET | Freq: Every day | ORAL | Status: DC

## 2016-03-01 MED ORDER — ATORVASTATIN CALCIUM 40 MG PO TABS: ORAL_TABLET | ORAL | Status: DC

## 2016-03-01 MED ORDER — ENALAPRIL MALEATE 10 MG PO TABS: 10.0000 mg | ORAL_TABLET | Freq: Every day | ORAL | Status: DC

## 2016-03-01 NOTE — Patient Instructions (Signed)

## 2016-03-01 NOTE — Progress Notes (Signed)
Cardiology Office Note Date:  03/01/2016   ID:  Meghin, Fontenot 1966-01-23, MRN [id number]  PCP:  Elby Showers, MD  Cardiologist:  Sherren Mocha, MD    Chief Complaint  Patient presents with  . Hypertension    denies any chest pain, sob, le edema, or claudication since last visit   History of Present Illness: [name] is a 50 y.o. female who presents for follow-up of coronary artery disease. She presented in December 2013 with an acute anterior wall MI. Cardiac catheterization demonstrated patency of the LAD with subtotal occlusion of a very small diagonal branch. She had a significant cardiac enzyme rise, but her LV function was within normal limits. Presumably this was a vasospastic event. She returns today for followup.  Overall she is doing fairly well. She's having some issues with her work hours. 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 This is been difficult for her because she has a high school son at home and her husband also works similar hours. She feels well and has no chest pain, chest pressure, shortness of breath, edema, or lightheadedness. She does have occasional palpitations.  Past Medical History  Diagnosis Date  . Hypertension     Controlled - she states she is on BP meds for HSP  . Anxiety   . Hypothyroidism   . GERD (gastroesophageal reflux disease)   . ADHD (attention deficit hyperactivity disorder)   . 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 GI bleed     2006: secondary to leukoclastic vasculitis  per chart  . Henoch-Schonlein purpura ([place])     HSP tx at [facility] in 2006 (also h/o leukocytoclastic vasculitis per chart 2006)  . Asthma   . Sleep apnea     "lost 50#; no problems w/it now" (11/22/2012)  . Migraines   . Depression   . Chronic kidney disease     in setting of HSP 2006  . CAD (coronary artery disease)     a. 11/2012  Acute Anterior STEMI/Cath: Nonobs dzs except subtotal occlusion of small, 98mm D2, EF 55-65%;  b. 11/2012  Echo: EF 60-65%, nl wall motion, Gr 2 DD.  . Tobacco abuse   . Coronary vasospasm ([place])     a. presumed    Past Surgical History  Procedure Laterality Date  . Appendectomy  1970's  . Cesarean section  1995; 1996; 1999  . Dilation and curettage of uterus  1990's  . Cardiac catheterization  11/22/2012    "first one" (11/22/2012)  . Left heart catheterization with coronary angiogram N/A 11/22/2012    Procedure: LEFT HEART CATHETERIZATION WITH CORONARY ANGIOGRAM;  Surgeon: Sherren Mocha, MD;  Location: [facility] CATH LAB;  Service: Cardiovascular;  Laterality: N/A;    Current Outpatient Prescriptions  Medication Sig Dispense Refill  . ALPRAZolam (XANAX) 0.25 MG tablet Take 0.25 mg by mouth daily as needed for anxiety.    Marland Kitchen amLODipine (NORVASC) 5 MG tablet TAKE 1 TABLET BY MOUTH DAILY. 90 tablet 0  . aspirin EC 81 MG EC tablet Take 1 tablet (81 mg total) by mouth daily.    Marland Kitchen atorvastatin (LIPITOR) 40 MG tablet TAKE 1 TABLET BY MOUTH ONCE DAILY AT 6 PM 30 tablet 11  . calcium-vitamin D (OSCAL WITH D) 500-200 MG-UNIT per tablet Take 1 tablet by mouth daily.    Marland Kitchen desvenlafaxine (PRISTIQ) 50 MG 24 hr tablet  Take 1 tablet (50 mg total) by mouth daily. 90 tablet 3  . enalapril (VASOTEC) 10 MG tablet Take 1 tablet (10 mg total) by mouth daily. 90 tablet 3  . HYDROcodone-acetaminophen (NORCO/VICODIN) 5-325 MG tablet Take 1 tablet by mouth every 6 (six) hours as needed for moderate pain.    Marland Kitchen levothyroxine (SYNTHROID, LEVOTHROID) 75 MCG tablet TAKE 1 TABLET BY MOUTH DAILY. 90 tablet 0  . nitroGLYCERIN (NITROSTAT) 0.4 MG SL tablet Place 1 tablet (0.4 mg total) under the tongue every 5 (five) minutes as needed for chest pain. 25 tablet 3  . ondansetron (ZOFRAN ODT) 4 MG disintegrating tablet 4mg  ODT q4 hours prn nausea/vomit 4 tablet 0  . pantoprazole (PROTONIX) 40 MG tablet TAKE 1 TABLET BY MOUTH TWICE  DAILY BEFORE MEALS 60 tablet 11   No current facility-administered medications for this visit.    Allergies:   Levaquin and Nsaids   Social History:  The patient  reports that she has been smoking Cigarettes.  She has a 12.5 pack-year smoking history. She has never used smokeless tobacco. She reports that she does not drink alcohol or use illicit drugs.   Family History:  The patient's  family history includes Hypertension in her mother; Stroke in her father. There is no history of Heart disease.   ROS:  Please see the history of present illness. All other systems are reviewed and negative.   PHYSICAL EXAM: VS:  BP 112/70 mmHg  Pulse 76  Ht 5\' 5"  (1.651 m)  Wt 202 lb 6.4 oz (91.808 kg)  BMI 33.68 kg/m2 , BMI Body mass index is 33.68 kg/(m^2). GEN: Well nourished, well developed, in no acute distress HEENT: normal Neck: no JVD, no masses. No carotid bruits Cardiac: RRR without murmur or gallop                Respiratory:  clear to auscultation bilaterally, normal work of breathing GI: soft, nontender, nondistended, + BS MS: no deformity or atrophy Ext: no pretibial edema, pedal pulses 2+= bilaterally Skin: warm and dry, no rash Neuro:  Strength and sensation are intact Psych: euthymic mood, full affect  EKG:  EKG is ordered today. The ekg ordered today shows NSR 75 bpm, within normal limits  Recent Labs: 06/16/2015: BUN 11; Creat 0.74; Hemoglobin 15.1*; Platelets 192; Potassium 4.5; Sodium 142 12/23/2015: ALT 29; TSH 1.421   Lipid Panel     Component Value Date/Time   CHOL 130 12/23/2015 0933   TRIG 106 12/23/2015 0933   HDL 50 12/23/2015 0933   CHOLHDL 2.6 12/23/2015 0933   VLDL 21 12/23/2015 0933   LDLCALC 59 12/23/2015 0933      Wt Readings from Last 3 Encounters:  03/01/16 202 lb 6.4 oz (91.808 kg)  12/25/15 204 lb (92.534 kg)  10/10/15 206 lb (93.441 kg)    ASSESSMENT AND PLAN: 1. Coronary artery disease, native vessel, without symptoms of angina. The  patient is stable current medical program. She is treated with a calcium channel blocker because of history of coronary vasospasm. Overall she is doing quite well.  2. Hyperlipidemia. Lipids are followed by her PCP. Last LDL cholesterol was 59 mg/dL. She will continue on atorvastatin 40 mg.  3. Essential hypertension. Enalapril was increased at the time of her last office visit. Her blood pressure is now well controlled. Refills were done today.  Current medicines are reviewed with the patient today.  The patient does not have concerns regarding medicines.  Labs/ tests ordered today include:  No orders of the defined types were placed in this encounter.   Disposition:   FU one year  Signed, Sherren Mocha, MD  03/01/2016 9:05 AM    [address], [place], [place]  [address] Phone: [phone]; Fax: [phone]

## 2016-03-15 ENCOUNTER — Other Ambulatory Visit: Payer: Self-pay | Admitting: Internal Medicine

## 2016-03-15 DIAGNOSIS — D225 Melanocytic nevi of trunk: Secondary | ICD-10-CM | POA: Diagnosis not present

## 2016-03-15 DIAGNOSIS — L84 Corns and callosities: Secondary | ICD-10-CM | POA: Diagnosis not present

## 2016-03-15 DIAGNOSIS — L570 Actinic keratosis: Secondary | ICD-10-CM | POA: Diagnosis not present

## 2016-03-15 DIAGNOSIS — L820 Inflamed seborrheic keratosis: Secondary | ICD-10-CM | POA: Diagnosis not present

## 2016-03-15 DIAGNOSIS — L821 Other seborrheic keratosis: Secondary | ICD-10-CM | POA: Diagnosis not present

## 2016-04-19 ENCOUNTER — Other Ambulatory Visit: Payer: Self-pay | Admitting: *Deleted

## 2016-04-19 MED ORDER — HYDROCODONE-ACETAMINOPHEN 5-325 MG PO TABS: 1.0000 | ORAL_TABLET | Freq: Four times a day (QID) | ORAL | Status: DC | PRN

## 2016-04-19 NOTE — Telephone Encounter (Signed)
Pt called and requested a refill of norco, pt has CPE scheduled for 06/22/16, please advise

## 2016-04-19 NOTE — Telephone Encounter (Signed)
Left voicemail letting pt know Rx ready for pick up 

## 2016-06-14 ENCOUNTER — Other Ambulatory Visit: Payer: Self-pay | Admitting: Internal Medicine

## 2016-06-21 ENCOUNTER — Other Ambulatory Visit: Payer: 59 | Admitting: Internal Medicine

## 2016-06-21 ENCOUNTER — Other Ambulatory Visit: Payer: Self-pay | Admitting: Internal Medicine

## 2016-06-21 DIAGNOSIS — E039 Hypothyroidism, unspecified: Secondary | ICD-10-CM

## 2016-06-21 DIAGNOSIS — I10 Essential (primary) hypertension: Secondary | ICD-10-CM | POA: Diagnosis not present

## 2016-06-21 DIAGNOSIS — E785 Hyperlipidemia, unspecified: Secondary | ICD-10-CM

## 2016-06-21 DIAGNOSIS — R7302 Impaired glucose tolerance (oral): Secondary | ICD-10-CM | POA: Diagnosis not present

## 2016-06-21 DIAGNOSIS — Z0000 Encounter for general adult medical examination without abnormal findings: Secondary | ICD-10-CM | POA: Diagnosis not present

## 2016-06-21 LAB — COMPLETE METABOLIC PANEL WITH GFR
ALT: 23 U/L (ref 6–29)
AST: 22 U/L (ref 10–35)
Albumin: 3.9 g/dL (ref 3.6–5.1)
Alkaline Phosphatase: 100 U/L (ref 33–130)
BUN: 11 mg/dL (ref 7–25)
CO2: 26 mmol/L (ref 20–31)
Calcium: 8.9 mg/dL (ref 8.6–10.4)
Chloride: 105 mmol/L (ref 98–110)
Creat: 0.91 mg/dL (ref 0.50–1.05)
GFR, Est African American: 84 mL/min (ref >=60)
GFR, Est Non African American: 73 mL/min (ref >=60)
Glucose, Bld: 110 mg/dL — ABNORMAL HIGH (ref 65–99)
Potassium: 4.3 mmol/L (ref 3.5–5.3)
Sodium: 143 mmol/L (ref 135–146)
Total Bilirubin: 0.5 mg/dL (ref 0.2–1.2)
Total Protein: 6.1 g/dL (ref 6.1–8.1)

## 2016-06-21 LAB — LIPID PANEL
Cholesterol: 123 mg/dL — ABNORMAL LOW (ref 125–200)
HDL: 51 mg/dL (ref >=46)
LDL Cholesterol: 49 mg/dL (ref <130)
Total CHOL/HDL Ratio: 2.4 Ratio (ref <=5.0)
Triglycerides: 114 mg/dL (ref <150)
VLDL: 23 mg/dL (ref <30)

## 2016-06-21 LAB — CBC WITH DIFFERENTIAL/PLATELET
Basophils Absolute: 0 cells/uL (ref 0–200)
Basophils Relative: 0 %
Eosinophils Absolute: 284 cells/uL (ref 15–500)
Eosinophils Relative: 4 %
HCT: 44.8 % (ref 35.0–45.0)
Hemoglobin: 14.9 g/dL (ref 11.7–15.5)
Lymphocytes Relative: 33 %
Lymphs Abs: 2343 cells/uL (ref 850–3900)
MCH: 31.1 pg (ref 27.0–33.0)
MCHC: 33.3 g/dL (ref 32.0–36.0)
MCV: 93.5 fL (ref 80.0–100.0)
MPV: 11.8 fL (ref 7.5–12.5)
Monocytes Absolute: 426 cells/uL (ref 200–950)
Monocytes Relative: 6 %
Neutro Abs: 4047 cells/uL (ref 1500–7800)
Neutrophils Relative %: 57 %
Platelets: 197 10*3/uL (ref 140–400)
RBC: 4.79 MIL/uL (ref 3.80–5.10)
RDW: 12.8 % (ref 11.0–15.0)
WBC: 7.1 10*3/uL (ref 3.8–10.8)

## 2016-06-21 LAB — TSH: TSH: 0.84 mIU/L

## 2016-06-22 ENCOUNTER — Other Ambulatory Visit: Payer: Self-pay

## 2016-06-22 ENCOUNTER — Ambulatory Visit (INDEPENDENT_AMBULATORY_CARE_PROVIDER_SITE_OTHER): Payer: 59 | Admitting: Internal Medicine

## 2016-06-22 ENCOUNTER — Encounter: Payer: Self-pay | Admitting: Internal Medicine

## 2016-06-22 VITALS — BP 114/84 | HR 77 | Temp 97.6°F | Ht 65.0 in | Wt 201.0 lb

## 2016-06-22 DIAGNOSIS — K219 Gastro-esophageal reflux disease without esophagitis: Secondary | ICD-10-CM | POA: Diagnosis not present

## 2016-06-22 DIAGNOSIS — E669 Obesity, unspecified: Secondary | ICD-10-CM

## 2016-06-22 DIAGNOSIS — E8881 Metabolic syndrome: Secondary | ICD-10-CM

## 2016-06-22 DIAGNOSIS — F411 Generalized anxiety disorder: Secondary | ICD-10-CM | POA: Diagnosis not present

## 2016-06-22 DIAGNOSIS — Z720 Tobacco use: Secondary | ICD-10-CM | POA: Diagnosis not present

## 2016-06-22 DIAGNOSIS — Z0000 Encounter for general adult medical examination without abnormal findings: Secondary | ICD-10-CM

## 2016-06-22 DIAGNOSIS — R7302 Impaired glucose tolerance (oral): Secondary | ICD-10-CM | POA: Diagnosis not present

## 2016-06-22 DIAGNOSIS — E039 Hypothyroidism, unspecified: Secondary | ICD-10-CM

## 2016-06-22 DIAGNOSIS — M7918 Myalgia, other site: Secondary | ICD-10-CM

## 2016-06-22 DIAGNOSIS — Z87891 Personal history of nicotine dependence: Secondary | ICD-10-CM

## 2016-06-22 DIAGNOSIS — G4733 Obstructive sleep apnea (adult) (pediatric): Secondary | ICD-10-CM | POA: Diagnosis not present

## 2016-06-22 DIAGNOSIS — I252 Old myocardial infarction: Secondary | ICD-10-CM

## 2016-06-22 DIAGNOSIS — E785 Hyperlipidemia, unspecified: Secondary | ICD-10-CM | POA: Diagnosis not present

## 2016-06-22 DIAGNOSIS — M791 Myalgia: Secondary | ICD-10-CM

## 2016-06-22 DIAGNOSIS — I10 Essential (primary) hypertension: Secondary | ICD-10-CM | POA: Diagnosis not present

## 2016-06-22 DIAGNOSIS — R079 Chest pain, unspecified: Secondary | ICD-10-CM

## 2016-06-22 LAB — POCT URINALYSIS DIPSTICK
Bilirubin, UA: NEGATIVE
Blood, UA: NEGATIVE
Glucose, UA: NEGATIVE
Ketones, UA: NEGATIVE
Leukocytes, UA: NEGATIVE
Nitrite, UA: NEGATIVE
Protein, UA: NEGATIVE
Spec Grav, UA: 1.01
Urobilinogen, UA: 0.2
pH, UA: 6.5

## 2016-06-22 LAB — VITAMIN D 25 HYDROXY (VIT D DEFICIENCY, FRACTURES): Vit D, 25-Hydroxy: 37 ng/mL (ref 30–100)

## 2016-06-22 LAB — HEMOGLOBIN A1C
Hgb A1c MFr Bld: 5.9 % — ABNORMAL HIGH (ref <5.7)
Mean Plasma Glucose: 123 mg/dL

## 2016-06-24 ENCOUNTER — Telehealth: Payer: Self-pay

## 2016-06-24 NOTE — Telephone Encounter (Signed)
Confirmed CT order received by [facility]. Still in process for scheduling.

## 2016-07-02 ENCOUNTER — Other Ambulatory Visit: Payer: Self-pay

## 2016-07-02 DIAGNOSIS — Z87891 Personal history of nicotine dependence: Secondary | ICD-10-CM

## 2016-07-05 ENCOUNTER — Encounter: Payer: Self-pay | Admitting: Internal Medicine

## 2016-07-05 MED ORDER — PANTOPRAZOLE SODIUM 40 MG PO TBEC: 40.0000 mg | DELAYED_RELEASE_TABLET | Freq: Two times a day (BID) | ORAL | 11 refills | Status: DC

## 2016-07-05 NOTE — Patient Instructions (Addendum)
Please concentrate on diet exercise weight loss and smoking cessation. Continue same medications and return in 6 months.

## 2016-07-05 NOTE — Progress Notes (Signed)
Subjective:    Patient ID: [name], female    DOB: 07-05-1966, 50 y.o.   MRN: [id number]  HPI 50 year old white female in today for health maintenance exam and evaluation of multiple medical issues. Unfortunately continues to smoke despite having had an MI. We continued to have conversations about that at every visit.  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 Had a subtotal occlusion of a very small branch of the diagonal and no other significant disease. She underwent cardiac catheterization and did well.  Had a very serious case of HS purpura October 2006. Hemoglobin dropped to 9.4 g she developed purpuric lesions on her lower extremities and maroon-colored stools. CT scan showed diffuse colitis and she developed ascites. Was transferred to [facility] after developing tea colored urine with hematuria. 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 Was treated with high-dose steroids which remained on for several months. Was treated by Dr. Marlana Salvage at [facility] in the Rheumatology Department and is never had recurrence.  In September 2009 she was hospitalized for gastroenteritis secondary to Clostridium difficile infection.  During hospitalization in 2006 she had a 2-D echocardiogram that was normal. In 2009 she had a normal cardiac nuclear study.  History of sleep apnea. She is not on C Pap. Weight loss has been recommended for sleep apnea. Sleep study showed desaturation to 87% with snoring.  History of migraine headaches  History of cervical radiculopathy 2012. History of an osteophyte causing some mild foraminal stenosis and impingement on MRI which is been evaluated by Dr. Vertell Limber.  For sometime she was maintained on Adderall for attention deficit disorder but this was discontinued after her MI.  History of endometrial polyp causing menorrhagia removed by Dr. Sander Radon November 2003. History of cesarean sections 1995, 1990 05/24/1998.  Appendectomy  in early 70s  No history of fractures.  Social history: Married with 3 sons. One son attends [employer]. Does not consume alcohol. He smoked half pack cigarettes daily for well over 20 years.  Family history: Both parents with history of hypertension. Mother passed away from an acute respiratory arrest. Father with history of stroke. 2 brothers in good health. No sisters.    Review of Systems chronic arthralgias and fatigue     Objective:   Physical Exam  Constitutional: She is oriented to person, place, and time. She appears well-developed and well-nourished. No distress.  HENT:  Head: Normocephalic and atraumatic.  Right Ear: External ear normal.  Left Ear: External ear normal.  Mouth/Throat: Oropharynx is clear and moist.  Eyes: Conjunctivae and EOM are normal. Pupils are equal, round, and reactive to light. Right eye exhibits no discharge. Left eye exhibits no discharge.  Neck: Neck supple. No JVD present. No thyromegaly present.  Cardiovascular: Normal rate, regular rhythm, normal heart sounds and intact distal pulses.   No murmur heard. Pulmonary/Chest: Effort normal and breath sounds normal. No respiratory distress. She has no wheezes. She has no rales.  Breasts normal female without masses  Abdominal: She exhibits no distension and no mass. There is no tenderness. There is no rebound and no guarding.  Genitourinary:  Genitourinary Comments: Deferred to Dr. Nori Riis  Musculoskeletal: She exhibits no edema.  Neurological: She is alert and oriented to person, place, and time. She has normal reflexes. No cranial nerve deficit.  Skin: Skin is warm and dry. No rash noted. She is not diaphoretic.  Psychiatric: She has a normal mood and affect. Her behavior is normal. Judgment  and thought content normal.  Vitals reviewed.         Assessment & Plan:  History of coronary artery disease followed by cardiology status post anterior MI December 2013  Essential  hypertension  Hypothyroidism  Obesity  Sleep apnea  History of smoking-once again counseling regarding smoking cessation  Anxiety depression  Musculoskeletal pain treated sparingly with hydrocodone/APAP  GE reflux  History of COPD/reactive airways disease related to acute upper respiratory infections  Obesity  Lipid panel normal on statin medication  Impaired glucose tolerance hemoglobin A1c 5.9%  Plan: Continue same medications and follow-up with cardiology. Once again please quit smoking. Recommend annual mammogram. Received flu vaccine through employment. Follow-up in 6 months or as needed. Counsel regarding diet exercise and weight loss.

## 2016-07-09 ENCOUNTER — Other Ambulatory Visit: Payer: Self-pay | Admitting: Internal Medicine

## 2016-07-09 DIAGNOSIS — J45909 Unspecified asthma, uncomplicated: Secondary | ICD-10-CM

## 2016-07-09 DIAGNOSIS — J441 Chronic obstructive pulmonary disease with (acute) exacerbation: Secondary | ICD-10-CM

## 2016-07-19 ENCOUNTER — Telehealth: Payer: Self-pay | Admitting: Internal Medicine

## 2016-07-19 MED ORDER — HYDROCODONE-ACETAMINOPHEN 5-325 MG PO TABS
1.0000 | ORAL_TABLET | Freq: Four times a day (QID) | ORAL | 0 refills | Status: DC | PRN
Start: 2016-07-19 — End: 2016-09-16

## 2016-07-19 NOTE — Telephone Encounter (Signed)
Patient calling for her refill on Norco 5-325mg .    Please call when Rx is ready for pick up.   Also, patient's Pulmonary referral is set up.  She will see Dr. Simonne Maffucci @ [facility] on 9/7 @ 4:15.  Arrive at 4:00 p.m.

## 2016-07-19 NOTE — Telephone Encounter (Signed)
Done

## 2016-07-19 NOTE — Telephone Encounter (Signed)
Refill once 

## 2016-07-29 DIAGNOSIS — Z6833 Body mass index (BMI) 33.0-33.9, adult: Secondary | ICD-10-CM | POA: Diagnosis not present

## 2016-07-29 DIAGNOSIS — Z01419 Encounter for gynecological examination (general) (routine) without abnormal findings: Secondary | ICD-10-CM | POA: Diagnosis not present

## 2016-08-10 ENCOUNTER — Ambulatory Visit (INDEPENDENT_AMBULATORY_CARE_PROVIDER_SITE_OTHER): Payer: 59 | Admitting: Internal Medicine

## 2016-08-10 ENCOUNTER — Encounter: Payer: Self-pay | Admitting: Internal Medicine

## 2016-08-10 VITALS — BP 118/78 | HR 85 | Temp 98.7°F | Ht 65.0 in | Wt 204.0 lb

## 2016-08-10 DIAGNOSIS — J209 Acute bronchitis, unspecified: Secondary | ICD-10-CM | POA: Diagnosis not present

## 2016-08-10 MED ORDER — CLARITHROMYCIN 500 MG PO TABS: 500.0000 mg | ORAL_TABLET | Freq: Two times a day (BID) | ORAL | 0 refills | Status: DC

## 2016-08-10 MED ORDER — HYDROCODONE-HOMATROPINE 5-1.5 MG/5ML PO SYRP: 5.0000 mL | ORAL_SOLUTION | Freq: Three times a day (TID) | ORAL | 0 refills | Status: DC | PRN

## 2016-08-10 MED ORDER — CEFTRIAXONE SODIUM 1 G IJ SOLR
1.0000 g | Freq: Once | INTRAMUSCULAR | Status: AC
  Administered 2016-08-10: 1 g via INTRAMUSCULAR

## 2016-08-10 MED ORDER — PREDNISONE 10 MG PO TABS: ORAL_TABLET | ORAL | 0 refills | Status: DC

## 2016-08-12 ENCOUNTER — Institutional Professional Consult (permissible substitution): Payer: 59 | Admitting: Pulmonary Disease

## 2016-08-20 ENCOUNTER — Telehealth: Payer: Self-pay | Admitting: Internal Medicine

## 2016-08-20 NOTE — Telephone Encounter (Signed)
Has stomatitis after taking Prednisone and antibiotics. Call in [place] 2 tsp swish do not swallow qid 16 oz to [place]

## 2016-08-31 ENCOUNTER — Ambulatory Visit (INDEPENDENT_AMBULATORY_CARE_PROVIDER_SITE_OTHER)
Admission: RE | Admit: 2016-08-31 | Discharge: 2016-08-31 | Disposition: A | Discharge status: Home / Self Care | Payer: 59 | Source: Ambulatory Visit | Attending: Pulmonary Disease | Admitting: Pulmonary Disease

## 2016-08-31 ENCOUNTER — Ambulatory Visit (INDEPENDENT_AMBULATORY_CARE_PROVIDER_SITE_OTHER): Payer: 59 | Admitting: Pulmonary Disease

## 2016-08-31 ENCOUNTER — Encounter: Payer: Self-pay | Admitting: Pulmonary Disease

## 2016-08-31 VITALS — BP 116/66 | HR 85 | Ht 65.0 in | Wt 205.0 lb

## 2016-08-31 DIAGNOSIS — R0602 Shortness of breath: Secondary | ICD-10-CM

## 2016-08-31 DIAGNOSIS — Z720 Tobacco use: Secondary | ICD-10-CM

## 2016-08-31 DIAGNOSIS — R05 Cough: Secondary | ICD-10-CM | POA: Diagnosis not present

## 2016-08-31 DIAGNOSIS — J984 Other disorders of lung: Secondary | ICD-10-CM | POA: Diagnosis not present

## 2016-08-31 MED ORDER — ALBUTEROL SULFATE HFA 108 (90 BASE) MCG/ACT IN AERS: 2.0000 | INHALATION_SPRAY | Freq: Four times a day (QID) | RESPIRATORY_TRACT | 5 refills | Status: DC | PRN

## 2016-08-31 NOTE — Patient Instructions (Signed)
Use albuterol as needed for chest tightness and shortness of breath Get a flu shot Exercise as much as possible We will call you with the results of the chest x-ray I will see you back in 6 months or sooner if needed

## 2016-08-31 NOTE — Assessment & Plan Note (Signed)
Counseled at length to quit smoking today.  Provided with smoking cessation materials.

## 2016-08-31 NOTE — Progress Notes (Signed)
Subjective:    Patient ID: [name], female    DOB: 1966-02-06, 50 y.o.   MRN: [id number]  HPI Chief Complaint  Patient presents with  . Advice Only    Referred for COPD by Dr. Renold Genta.      [name] is here to see me today at the request of her [facility] for possible COPD.  She has smoked cigarettes for years.  She smokes 1ppd now.  She has smoked since age 78.    She has been experiencing chest pain in the left chest for years. > she last had a chest x-ray several years ago > she has felt a mild discomfort in the left chest off an on   She has also noted dyspnea > occurred this year with cutting the grass, panting more than usual. > started in June 2017 > She hasn't noticed the dyspnea with other activity > she has more dyspnea with climbing stairs > she experienced wheezing when sick with a respiratory > she has used albuterol in the past > better now that she is not sick  Mucus production > produced some chest congestion with bronchitis, but not typically when well > no hemoptysis  Around the beginning of September she had a head cold that started to move into her chest (which she reports is a recurrent problems) and she needed antibiotics and prednisone.    Her father was recently diagnosis with COPD, he smoked at a young age.  She works as a Marine scientist in Urgent care.    Her childhood was normal without respiratory problems.  She was born full term.    Past Medical History:  Diagnosis Date  . ADHD (attention deficit hyperactivity disorder)   . Anxiety   . Asthma   . CAD (coronary artery disease)    a. 11/2012 Acute Anterior STEMI/Cath: Nonobs dzs except subtotal occlusion of small, 58mm D2, EF 55-65%;  b. 11/2012  Echo: EF 60-65%, nl wall motion, Gr 2 DD.  Marland Kitchen Chronic kidney disease    in setting of HSP 2006  . Coronary vasospasm (HCC)    a. presumed  . Depression   . GERD (gastroesophageal reflux disease)   . GI bleed    2006: secondary to leukoclastic  vasculitis  per chart  . 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 Hypertension    Controlled - she states she is on BP meds for HSP  . Hypothyroidism   . Migraines   . Sleep apnea    "lost 50#; no problems w/it now" (11/22/2012)  . 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 Tobacco abuse      Family History  Problem Relation Age of Onset  . Stroke Father   . Hypertension Mother   . Heart disease Neg Hx      Social History   Social History  . Marital status: Married    Spouse name: N/A  . Number of children: N/A  . Years of education: N/A   Occupational History  . 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 Smoking status: Current Every Day Smoker    Packs/day: 0.50    Years: 25.00    Types: Cigarettes  . Smokeless tobacco: Never Used  . Alcohol use No  . Drug use: No  .  Sexual activity: Yes   Other Topics Concern  . Not on file   Social History Narrative  . No narrative on file     Allergies  Allergen Reactions  . Levaquin [Levofloxacin] Hives  . Nsaids Other (See Comments)    Able to take aspirin "Had HSP; since then they treat my kidneys very nicely" (11/22/2012)     Outpatient Medications Prior to Visit  Medication Sig Dispense Refill  . ALPRAZolam (XANAX) 0.25 MG tablet Take 0.25 mg by mouth daily as needed for anxiety.    Marland Kitchen amLODipine (NORVASC) 5 MG tablet Take 1 tablet (5 mg total) by mouth daily. 90 tablet 3  . aspirin EC 81 MG EC tablet Take 1 tablet (81 mg total) by mouth daily.    Marland Kitchen atorvastatin (LIPITOR) 40 MG tablet TAKE 1 TABLET BY MOUTH ONCE DAILY AT 6 PM 90 tablet 3  . calcium-vitamin D (OSCAL WITH D) 500-200 MG-UNIT per tablet Take 1 tablet by mouth daily.    Marland Kitchen desvenlafaxine (PRISTIQ) 50 MG 24 hr tablet Take 1 tablet (50 mg total) by mouth daily. 90 tablet 3  .  enalapril (VASOTEC) 10 MG tablet Take 1 tablet (10 mg total) by mouth daily. 90 tablet 3  . HYDROcodone-acetaminophen (NORCO/VICODIN) 5-325 MG tablet Take 1 tablet by mouth every 6 (six) hours as needed for moderate pain. 60 tablet 0  . HYDROcodone-homatropine (HYCODAN) 5-1.5 MG/5ML syrup Take 5 mLs by mouth every 8 (eight) hours as needed for cough. 120 mL 0  . levothyroxine (SYNTHROID, LEVOTHROID) 75 MCG tablet TAKE 1 TABLET BY MOUTH DAILY. 90 tablet 0  . nitroGLYCERIN (NITROSTAT) 0.4 MG SL tablet Place 1 tablet (0.4 mg total) under the tongue every 5 (five) minutes as needed for chest pain. 25 tablet 3  . ondansetron (ZOFRAN ODT) 4 MG disintegrating tablet 4mg  ODT q4 hours prn nausea/vomit 4 tablet 0  . pantoprazole (PROTONIX) 40 MG tablet Take 1 tablet (40 mg total) by mouth 2 (two) times daily before a meal. 60 tablet 11  . clarithromycin (BIAXIN) 500 MG tablet Take 1 tablet (500 mg total) by mouth 2 (two) times daily. 20 tablet 0  . predniSONE (DELTASONE) 10 MG tablet Take in tapering course as directed. 6-5-4-3-2-1 taper 21 tablet 0   No facility-administered medications prior to visit.       Review of Systems  Constitutional: Negative for fever and unexpected weight change.  HENT: Negative for congestion, dental problem, ear pain, nosebleeds, postnasal drip, rhinorrhea, sinus pressure, sneezing, sore throat and trouble swallowing.   Eyes: Negative for redness and itching.  Respiratory: Positive for chest tightness and shortness of breath. Negative for cough and wheezing.   Cardiovascular: Positive for chest pain. Negative for palpitations and leg swelling.  Gastrointestinal: Negative for nausea and vomiting.  Genitourinary: Negative for dysuria.  Musculoskeletal: Negative for joint swelling.  Skin: Negative for rash.  Neurological: Negative for headaches.  Hematological: Does not bruise/bleed easily.  Psychiatric/Behavioral: Negative for dysphoric mood. The patient is not  nervous/anxious.        Objective:   Physical Exam Vitals:   08/31/16 1551  BP: 116/66  Pulse: 85  SpO2: 98%  Weight: 205 lb (93 kg)  Height: 5\' 5"  (1.651 m)    Gen: well appearing, no acute distress HENT: NCAT, OP clear, neck supple without masses Eyes: PERRL, EOMi Lymph: no cervical lymphadenopathy PULM: CTA B CV: RRR, no mgr, no JVD GI: BS+, soft, nontender, no hsm Derm: no rash or skin breakdown  MSK: normal bulk and tone Neuro: A&Ox4, CN II-XII intact, strength 5/5 in all 4 extremities Psyche: normal mood and affect        Assessment & Plan:  Small airways disease Today's lung function test did not show findings consistent with COPD but it did show small airways obstruction which many COPD experts feel represent the initial stages of COPD. Small airways disease is clearly due to cigarette use. Can be associated with shortness of breath and cough as she has experienced in the last year.  I told her now that this is the best time for her to stop smoking as she has not yet developed COPD. However, she should consider herself "on the way" to developing COPD if she continues to smoke.  Plan: Smoking cessation encouraged Use albuterol as needed Get a flu shot every year Follow-up with me in 6 months, if she has had recurrent episodes of bronchitis or worsening dyspnea that we could consider a long-acting beta agonist or antimuscarinic but don't think there is any role for it this time.  Tobacco abuse Counseled at length to quit smoking today.  Provided with smoking cessation materials.    Current Outpatient Prescriptions:  .  ALPRAZolam (XANAX) 0.25 MG tablet, Take 0.25 mg by mouth daily as needed for anxiety., Disp: , Rfl:  .  amLODipine (NORVASC) 5 MG tablet, Take 1 tablet (5 mg total) by mouth daily., Disp: 90 tablet, Rfl: 3 .  aspirin EC 81 MG EC tablet, Take 1 tablet (81 mg total) by mouth daily., Disp: , Rfl:  .  atorvastatin (LIPITOR) 40 MG tablet, TAKE 1  TABLET BY MOUTH ONCE DAILY AT 6 PM, Disp: 90 tablet, Rfl: 3 .  calcium-vitamin D (OSCAL WITH D) 500-200 MG-UNIT per tablet, Take 1 tablet by mouth daily., Disp: , Rfl:  .  desvenlafaxine (PRISTIQ) 50 MG 24 hr tablet, Take 1 tablet (50 mg total) by mouth daily., Disp: 90 tablet, Rfl: 3 .  enalapril (VASOTEC) 10 MG tablet, Take 1 tablet (10 mg total) by mouth daily., Disp: 90 tablet, Rfl: 3 .  HYDROcodone-acetaminophen (NORCO/VICODIN) 5-325 MG tablet, Take 1 tablet by mouth every 6 (six) hours as needed for moderate pain., Disp: 60 tablet, Rfl: 0 .  HYDROcodone-homatropine (HYCODAN) 5-1.5 MG/5ML syrup, Take 5 mLs by mouth every 8 (eight) hours as needed for cough., Disp: 120 mL, Rfl: 0 .  levothyroxine (SYNTHROID, LEVOTHROID) 75 MCG tablet, TAKE 1 TABLET BY MOUTH DAILY., Disp: 90 tablet, Rfl: 0 .  nitroGLYCERIN (NITROSTAT) 0.4 MG SL tablet, Place 1 tablet (0.4 mg total) under the tongue every 5 (five) minutes as needed for chest pain., Disp: 25 tablet, Rfl: 3 .  ondansetron (ZOFRAN ODT) 4 MG disintegrating tablet, 4mg  ODT q4 hours prn nausea/vomit, Disp: 4 tablet, Rfl: 0 .  pantoprazole (PROTONIX) 40 MG tablet, Take 1 tablet (40 mg total) by mouth 2 (two) times daily before a meal., Disp: 60 tablet, Rfl: 11 .  albuterol (PROVENTIL HFA;VENTOLIN HFA) 108 (90 Base) MCG/ACT inhaler, Inhale 2 puffs into the lungs every 6 (six) hours as needed for wheezing or shortness of breath., Disp: 1 Inhaler, Rfl: 5

## 2016-08-31 NOTE — Assessment & Plan Note (Signed)
Today's lung function test did not show findings consistent with COPD but it did show small airways obstruction which many COPD experts feel represent the initial stages of COPD. Small airways disease is clearly due to cigarette use. Can be associated with shortness of breath and cough as she has experienced in the last year.  I told her now that this is the best time for her to stop smoking as she has not yet developed COPD. However, she should consider herself "on the way" to developing COPD if she continues to smoke.  Plan: Smoking cessation encouraged Use albuterol as needed Get a flu shot every year Follow-up with me in 6 months, if she has had recurrent episodes of bronchitis or worsening dyspnea that we could consider a long-acting beta agonist or antimuscarinic but don't think there is any role for it this time.

## 2016-08-31 NOTE — Progress Notes (Deleted)
Subjective:    Patient ID: [name], female    DOB: 15-Oct-1966, 50 y.o.   MRN: [id number]  HPI Chief Complaint  Patient presents with  . Advice Only    Referred for COPD by Dr. Renold Genta.     [name] is here to see me today at the request of her [facility] for possible COPD.  She has smoked cigarettes for years.  She smokes 1ppd now.  She has smoked since age 6.    She has been experiencing chest pain in the left chest for years. > she last had a chest x-ray several years ago > she has felt a mild discomfort in the left chest off an on   She has also noted dyspnea > occurred this year with cutting the grass, panting more than usual. > started in June 2017 > She hasn't noticed the dyspnea with other activity > she has more dyspnea with climbing stairs > she experienced wheezing when sick with a respiratory > she has used albuterol in the past > better now that she is not sick  Mucus production > produced some chest congestion with bronchitis, but not typically when well > no hemoptysis  Around the beginning of September she had a head cold that started to move into her chest (which she reports is a recurrent problems) and she needed antibiotics and prednisone.    Her father was recently diagnosis with COPD, he smoked at a young age.  She works as a Marine scientist in Urgent care.    Her childhood was normal without respiratory problems.  She was born full term.     Past Medical History:  Diagnosis Date  . ADHD (attention deficit hyperactivity disorder)   . Anxiety   . Asthma   . CAD (coronary artery disease)    a. 11/2012 Acute Anterior STEMI/Cath: Nonobs dzs except subtotal occlusion of small, 29mm D2, EF 55-65%;  b. 11/2012  Echo: EF 60-65%, nl wall motion, Gr 2 DD.  Marland Kitchen Chronic kidney disease    in setting of HSP 2006  . Coronary vasospasm (HCC)    a. presumed  . Depression   . GERD (gastroesophageal reflux disease)   . GI bleed    2006: secondary to leukoclastic  vasculitis  per chart  . 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 Hypertension    Controlled - she states she is on BP meds for HSP  . Hypothyroidism   . Migraines   . Sleep apnea    "lost 50#; no problems w/it now" (11/22/2012)  . 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 Tobacco abuse      Family History  Problem Relation Age of Onset  . Stroke Father   . Hypertension Mother   . Heart disease Neg Hx      Social History   Social History  . Marital status: Married    Spouse name: N/A  . Number of children: N/A  . Years of education: N/A   Occupational History  . 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 Smoking status: Current Every Day Smoker    Packs/day: 0.50    Years: 25.00    Types: Cigarettes  . Smokeless tobacco: Never Used  . Alcohol use No  . Drug use: No  .  Sexual activity: Yes   Other Topics Concern  . Not on file   Social History Narrative  . No narrative on file     Allergies  Allergen Reactions  . Levaquin [Levofloxacin] Hives  . Nsaids Other (See Comments)    Able to take aspirin "Had HSP; since then they treat my kidneys very nicely" (11/22/2012)     Outpatient Medications Prior to Visit  Medication Sig Dispense Refill  . ALPRAZolam (XANAX) 0.25 MG tablet Take 0.25 mg by mouth daily as needed for anxiety.    Marland Kitchen amLODipine (NORVASC) 5 MG tablet Take 1 tablet (5 mg total) by mouth daily. 90 tablet 3  . aspirin EC 81 MG EC tablet Take 1 tablet (81 mg total) by mouth daily.    Marland Kitchen atorvastatin (LIPITOR) 40 MG tablet TAKE 1 TABLET BY MOUTH ONCE DAILY AT 6 PM 90 tablet 3  . calcium-vitamin D (OSCAL WITH D) 500-200 MG-UNIT per tablet Take 1 tablet by mouth daily.    Marland Kitchen desvenlafaxine (PRISTIQ) 50 MG 24 hr tablet Take 1 tablet (50 mg total) by mouth daily. 90 tablet 3  .  enalapril (VASOTEC) 10 MG tablet Take 1 tablet (10 mg total) by mouth daily. 90 tablet 3  . HYDROcodone-acetaminophen (NORCO/VICODIN) 5-325 MG tablet Take 1 tablet by mouth every 6 (six) hours as needed for moderate pain. 60 tablet 0  . HYDROcodone-homatropine (HYCODAN) 5-1.5 MG/5ML syrup Take 5 mLs by mouth every 8 (eight) hours as needed for cough. 120 mL 0  . levothyroxine (SYNTHROID, LEVOTHROID) 75 MCG tablet TAKE 1 TABLET BY MOUTH DAILY. 90 tablet 0  . nitroGLYCERIN (NITROSTAT) 0.4 MG SL tablet Place 1 tablet (0.4 mg total) under the tongue every 5 (five) minutes as needed for chest pain. 25 tablet 3  . ondansetron (ZOFRAN ODT) 4 MG disintegrating tablet 4mg  ODT q4 hours prn nausea/vomit 4 tablet 0  . pantoprazole (PROTONIX) 40 MG tablet Take 1 tablet (40 mg total) by mouth 2 (two) times daily before a meal. 60 tablet 11  . clarithromycin (BIAXIN) 500 MG tablet Take 1 tablet (500 mg total) by mouth 2 (two) times daily. 20 tablet 0  . predniSONE (DELTASONE) 10 MG tablet Take in tapering course as directed. 6-5-4-3-2-1 taper 21 tablet 0   No facility-administered medications prior to visit.       Review of Systems  Constitutional: Negative for fever and unexpected weight change.  HENT: Negative for congestion, dental problem, ear pain, nosebleeds, postnasal drip, rhinorrhea, sinus pressure, sneezing, sore throat and trouble swallowing.   Eyes: Negative for redness and itching.  Respiratory: Positive for chest tightness and shortness of breath. Negative for cough and wheezing.   Cardiovascular: Positive for chest pain. Negative for palpitations and leg swelling.  Gastrointestinal: Negative for nausea and vomiting.  Genitourinary: Negative for dysuria.  Musculoskeletal: Negative for joint swelling.  Skin: Negative for rash.  Neurological: Negative for headaches.  Hematological: Does not bruise/bleed easily.  Psychiatric/Behavioral: Negative for dysphoric mood. The patient is not  nervous/anxious.        Objective:   Physical Exam  Vitals:   08/31/16 1551  BP: 116/66  Pulse: 85  SpO2: 98%  Weight: 205 lb (93 kg)  Height: 5\' 5"  (1.651 m)         Assessment & Plan:  Small airways disease Today's lung function test did not show findings consistent with COPD but it did show small airways obstruction which many COPD experts feel represent the initial stages of COPD.  Small airways disease is clearly due to cigarette use. Can be associated with shortness of breath and cough as she has experienced in the last year.  I told her now that this is the best time for her to stop smoking as she has not yet developed COPD. However, she should consider herself "on the way" to developing COPD if she continues to smoke.  Plan: Smoking cessation encouraged Use albuterol as needed Get a flu shot every year Follow-up with me in 6 months, if she has had recurrent episodes of bronchitis or worsening dyspnea that we could consider a long-acting beta agonist or antimuscarinic but don't think there is any role for it this time.  Tobacco abuse Counseled at length to quit smoking today.  Provided with smoking cessation materials.    Current Outpatient Prescriptions:  .  ALPRAZolam (XANAX) 0.25 MG tablet, Take 0.25 mg by mouth daily as needed for anxiety., Disp: , Rfl:  .  amLODipine (NORVASC) 5 MG tablet, Take 1 tablet (5 mg total) by mouth daily., Disp: 90 tablet, Rfl: 3 .  aspirin EC 81 MG EC tablet, Take 1 tablet (81 mg total) by mouth daily., Disp: , Rfl:  .  atorvastatin (LIPITOR) 40 MG tablet, TAKE 1 TABLET BY MOUTH ONCE DAILY AT 6 PM, Disp: 90 tablet, Rfl: 3 .  calcium-vitamin D (OSCAL WITH D) 500-200 MG-UNIT per tablet, Take 1 tablet by mouth daily., Disp: , Rfl:  .  desvenlafaxine (PRISTIQ) 50 MG 24 hr tablet, Take 1 tablet (50 mg total) by mouth daily., Disp: 90 tablet, Rfl: 3 .  enalapril (VASOTEC) 10 MG tablet, Take 1 tablet (10 mg total) by mouth daily., Disp: 90  tablet, Rfl: 3 .  HYDROcodone-acetaminophen (NORCO/VICODIN) 5-325 MG tablet, Take 1 tablet by mouth every 6 (six) hours as needed for moderate pain., Disp: 60 tablet, Rfl: 0 .  HYDROcodone-homatropine (HYCODAN) 5-1.5 MG/5ML syrup, Take 5 mLs by mouth every 8 (eight) hours as needed for cough., Disp: 120 mL, Rfl: 0 .  levothyroxine (SYNTHROID, LEVOTHROID) 75 MCG tablet, TAKE 1 TABLET BY MOUTH DAILY., Disp: 90 tablet, Rfl: 0 .  nitroGLYCERIN (NITROSTAT) 0.4 MG SL tablet, Place 1 tablet (0.4 mg total) under the tongue every 5 (five) minutes as needed for chest pain., Disp: 25 tablet, Rfl: 3 .  ondansetron (ZOFRAN ODT) 4 MG disintegrating tablet, 4mg  ODT q4 hours prn nausea/vomit, Disp: 4 tablet, Rfl: 0 .  pantoprazole (PROTONIX) 40 MG tablet, Take 1 tablet (40 mg total) by mouth 2 (two) times daily before a meal., Disp: 60 tablet, Rfl: 11 .  albuterol (PROVENTIL HFA;VENTOLIN HFA) 108 (90 Base) MCG/ACT inhaler, Inhale 2 puffs into the lungs every 6 (six) hours as needed for wheezing or shortness of breath., Disp: 1 Inhaler, Rfl: 5

## 2016-09-01 NOTE — Patient Instructions (Signed)
Biaxin 500 mg twice daily for 10 days. Sterapred DS 10 mg 6 day dosepak take as directed. Hycodan 1 teens been PA every 6 hours when necessary cough. Rest and drink plenty of fluids. Albuterol inhaler 2 sprays by mouth 4 times daily.

## 2016-09-01 NOTE — Progress Notes (Signed)
   Subjective:    Patient ID: [name], female    DOB: September 11, 1966, 50 y.o.   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 Has had malaise fatigue chills, teeth hurting and headache. Is not able to be out of work due to other coworkers being on vacation.  Unfortunately continues to smoke.    Review of Systems see above     Objective:   Physical Exam Skin warm and dry. Nodes none. TMs are slightly full bilaterally. Neck supple. Chest clear to auscultation without rales or wheezing. Has congested cough.       Assessment & Plan:  Acute bronchitis  Plan: Hycodan 1 teaspoon by mouth every 8 hours when necessary cough. Albuterol inhaler 2 sprays by mouth 4 times daily. Biaxin 500 mg twice daily for 10 days. Sterapred DS 10 mg 6 day dosepak take as directed. Rocephin 1 g IM.

## 2016-09-16 ENCOUNTER — Other Ambulatory Visit: Payer: Self-pay | Admitting: Internal Medicine

## 2016-09-16 ENCOUNTER — Encounter: Payer: Self-pay | Admitting: Internal Medicine

## 2016-09-16 MED ORDER — HYDROCODONE-ACETAMINOPHEN 5-325 MG PO TABS: 1.0000 | ORAL_TABLET | Freq: Four times a day (QID) | ORAL | 0 refills | Status: DC | PRN

## 2016-09-16 NOTE — Telephone Encounter (Signed)
Written Rx Norco 5/325 #60 with no refill

## 2016-09-16 NOTE — Telephone Encounter (Signed)
Calling to request her refill on Norco 5-325 mg.  Hasn't heard back from Pain Management yet with appointment date/time.  It was just sent about a month ago.    Thank you.

## 2016-09-20 ENCOUNTER — Other Ambulatory Visit: Payer: Self-pay | Admitting: *Deleted

## 2016-09-20 MED ORDER — LEVOTHYROXINE SODIUM 75 MCG PO TABS: 75.0000 ug | ORAL_TABLET | Freq: Every day | ORAL | 1 refills | Status: DC

## 2016-11-11 ENCOUNTER — Ambulatory Visit (INDEPENDENT_AMBULATORY_CARE_PROVIDER_SITE_OTHER): Payer: 59 | Admitting: Internal Medicine

## 2016-11-11 ENCOUNTER — Encounter: Payer: Self-pay | Admitting: Internal Medicine

## 2016-11-11 VITALS — BP 114/60 | HR 73 | Temp 98.1°F | Ht 65.0 in | Wt 202.0 lb

## 2016-11-11 DIAGNOSIS — J040 Acute laryngitis: Secondary | ICD-10-CM | POA: Diagnosis not present

## 2016-11-11 MED ORDER — CLARITHROMYCIN 500 MG PO TABS: 500.0000 mg | ORAL_TABLET | Freq: Two times a day (BID) | ORAL | 0 refills | Status: DC

## 2016-11-11 MED ORDER — CEFTRIAXONE SODIUM 1 G IJ SOLR
1.0000 g | Freq: Once | INTRAMUSCULAR | Status: AC
  Administered 2016-11-11: 1 g via INTRAMUSCULAR

## 2016-11-11 NOTE — Patient Instructions (Signed)
Biaxin 500 mg twice a day for 10 days. Rocephin one gram IM. Hycodan for cough.

## 2016-11-11 NOTE — Progress Notes (Signed)
   Subjective:    Patient ID: [name], female    DOB: 10/14/1966, 50 y.o.   MRN: [id number]  HPI  50 year old FemaleWith URI symptoms. Says that her working environment has been quite cold recently. Encounters with lots of patients with respiratory infections. Had flu vaccine at work. She continues to smoke. Has has laryngitis.    Review of Systems see above     Objective:   Physical Exam Skin warm and dry. Nodes none. Drinks very slightly injected. TMs not red. Neck is supple. No significant adenopathy. Chest clear to auscultation       Assessment & Plan:  Acute URI  Acute laryngitis  History smoking  History of coronary disease  Plan: Biaxin 500 mg twice daily for 10 days. Rocephin 1 g IM. Hycodan 1 teaspoon by mouth every 8 hours when necessary cough.

## 2016-11-15 ENCOUNTER — Telehealth: Payer: Self-pay | Admitting: Internal Medicine

## 2016-11-15 DIAGNOSIS — M7918 Myalgia, other site: Secondary | ICD-10-CM

## 2016-11-15 MED ORDER — HYDROCODONE-ACETAMINOPHEN 5-325 MG PO TABS: 1.0000 | ORAL_TABLET | Freq: Four times a day (QID) | ORAL | 0 refills | Status: DC | PRN

## 2016-11-15 NOTE — Telephone Encounter (Signed)
Patient is calling for refill on her Hydrocodone 5-325mg  please.    Advised we will call her when it is ready for pick up.    Best number for contact:  [phone].

## 2016-11-15 NOTE — Telephone Encounter (Signed)
Hydrocodone printed.  Referral to pain management followed up on.

## 2016-11-15 NOTE — Telephone Encounter (Signed)
Refill once 

## 2016-11-18 ENCOUNTER — Other Ambulatory Visit: Payer: Self-pay | Admitting: Internal Medicine

## 2016-12-21 ENCOUNTER — Other Ambulatory Visit: Payer: 59 | Admitting: Internal Medicine

## 2016-12-23 ENCOUNTER — Ambulatory Visit: Payer: 59 | Admitting: Internal Medicine

## 2017-01-03 ENCOUNTER — Other Ambulatory Visit: Payer: 59 | Admitting: Internal Medicine

## 2017-01-03 DIAGNOSIS — I10 Essential (primary) hypertension: Secondary | ICD-10-CM

## 2017-01-03 DIAGNOSIS — E039 Hypothyroidism, unspecified: Secondary | ICD-10-CM | POA: Diagnosis not present

## 2017-01-03 DIAGNOSIS — I25119 Atherosclerotic heart disease of native coronary artery with unspecified angina pectoris: Secondary | ICD-10-CM | POA: Diagnosis not present

## 2017-01-03 DIAGNOSIS — R7302 Impaired glucose tolerance (oral): Secondary | ICD-10-CM | POA: Diagnosis not present

## 2017-01-03 LAB — BASIC METABOLIC PANEL
BUN: 9 mg/dL (ref 7–25)
CO2: 30 mmol/L (ref 20–31)
Calcium: 9.2 mg/dL (ref 8.6–10.4)
Chloride: 106 mmol/L (ref 98–110)
Creat: 0.75 mg/dL (ref 0.50–1.05)
Glucose, Bld: 109 mg/dL — ABNORMAL HIGH (ref 65–99)
Potassium: 4.3 mmol/L (ref 3.5–5.3)
Sodium: 141 mmol/L (ref 135–146)

## 2017-01-03 LAB — HEMOGLOBIN A1C
Hgb A1c MFr Bld: 5.6 % (ref <5.7)
Mean Plasma Glucose: 114 mg/dL

## 2017-01-03 LAB — LIPID PANEL
Cholesterol: 121 mg/dL (ref <200)
HDL: 49 mg/dL — ABNORMAL LOW (ref >50)
LDL Cholesterol: 55 mg/dL (ref <100)
Total CHOL/HDL Ratio: 2.5 Ratio (ref <5.0)
Triglycerides: 86 mg/dL (ref <150)
VLDL: 17 mg/dL (ref <30)

## 2017-01-03 LAB — TSH: TSH: 0.67 mIU/L

## 2017-01-04 ENCOUNTER — Ambulatory Visit (INDEPENDENT_AMBULATORY_CARE_PROVIDER_SITE_OTHER): Payer: 59 | Admitting: Internal Medicine

## 2017-01-04 ENCOUNTER — Encounter: Payer: Self-pay | Admitting: Internal Medicine

## 2017-01-04 VITALS — BP 108/70 | HR 92 | Temp 97.8°F | Wt 200.8 lb

## 2017-01-04 DIAGNOSIS — M7918 Myalgia, other site: Secondary | ICD-10-CM

## 2017-01-04 DIAGNOSIS — K219 Gastro-esophageal reflux disease without esophagitis: Secondary | ICD-10-CM

## 2017-01-04 DIAGNOSIS — F418 Other specified anxiety disorders: Secondary | ICD-10-CM | POA: Diagnosis not present

## 2017-01-04 DIAGNOSIS — I519 Heart disease, unspecified: Secondary | ICD-10-CM

## 2017-01-04 DIAGNOSIS — M791 Myalgia: Secondary | ICD-10-CM | POA: Diagnosis not present

## 2017-01-04 DIAGNOSIS — Z87891 Personal history of nicotine dependence: Secondary | ICD-10-CM

## 2017-01-04 DIAGNOSIS — F419 Anxiety disorder, unspecified: Secondary | ICD-10-CM

## 2017-01-04 DIAGNOSIS — Z8679 Personal history of other diseases of the circulatory system: Secondary | ICD-10-CM | POA: Diagnosis not present

## 2017-01-04 DIAGNOSIS — R7302 Impaired glucose tolerance (oral): Secondary | ICD-10-CM

## 2017-01-04 DIAGNOSIS — J069 Acute upper respiratory infection, unspecified: Secondary | ICD-10-CM

## 2017-01-04 DIAGNOSIS — F329 Major depressive disorder, single episode, unspecified: Secondary | ICD-10-CM

## 2017-01-04 DIAGNOSIS — F32A Depression, unspecified: Secondary | ICD-10-CM

## 2017-01-04 DIAGNOSIS — I10 Essential (primary) hypertension: Secondary | ICD-10-CM | POA: Diagnosis not present

## 2017-01-04 LAB — MICROALBUMIN / CREATININE URINE RATIO
Creatinine, Urine: 20 mg/dL (ref 20–320)
Microalb Creat Ratio: 25 mcg/mg creat (ref <30)
Microalb, Ur: 0.5 mg/dL

## 2017-01-04 MED ORDER — CLARITHROMYCIN 500 MG PO TABS: 500.0000 mg | ORAL_TABLET | Freq: Two times a day (BID) | ORAL | 0 refills | Status: DC

## 2017-01-04 NOTE — Patient Instructions (Signed)
Take Biaxin twice daily as directed for 10 days. Do not take Lipitor while taking Biaxin. Please stop smoking. Continue same medications. Watch diet and try to get some exercise. Return in 6 months.

## 2017-01-04 NOTE — Progress Notes (Signed)
   Subjective:    Patient ID: [name], female    DOB: 05-19-1966, 51 y.o.   MRN: [id number]  HPI For six-month follow-up on essential hypertension, hypothyroidism, impaired glucose tolerance, obstructive sleep apnea, musculoskeletal pain, history of coronary artery disease status post MI 4 years ago. She has a history of anxiety depression. History of smoking and has not been able to quit.  Has not had flu. Did receive flu vaccine through employment. Has had some cough recently but no fever or chills. Small amount of sputum production.  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    Review of Systems no new complaints except for cough     Objective:   Physical Exam  Skin: warm and dry. Nodes none. Neck is supple and without  thyromegaly. Chest clear to auscultation. Cardiac exam regular rate and rhythm normal S1 and S2. Extremities without edema.      Assessment & Plan:  Essential hypertension  Impaired glucose tolerance  History of heart disease-on statin therapy  Musculoskeletal pain/fibromyalgia type syndrome  Acute URI-treat with Biaxin 500 mg twice daily for 10 days. Do not take Lipitor while taking Biaxin.Marland Kitchen History smoking-1 scan counseled to quit  Hypothyroidism-continue same dose of thyroid replacement therapy  Plan: Return in 6 months for physical examination. Continue same medications. Encouraged diet exercise and weight loss. Encouraged smoking cessation.

## 2017-01-17 ENCOUNTER — Telehealth: Payer: Self-pay | Admitting: Internal Medicine

## 2017-01-17 MED ORDER — HYDROCODONE-ACETAMINOPHEN 5-325 MG PO TABS: 1.0000 | ORAL_TABLET | Freq: Four times a day (QID) | ORAL | 0 refills | Status: DC | PRN

## 2017-01-17 NOTE — Telephone Encounter (Signed)
Patient calling for a refill on her Norco.  She received a call from [place] at [facility] and she's VERY upset and says that you and she have NEVER discussed her going to Pain Management before and she's extremely upset.  States that this "has hit her like a ton of bricks" when this guy called her this morning.  I advised that I had sent the referral back the latter part of October.  I explained the reasoning for the referral; due to the change in the laws for prescribing pain pills and therefore we couldn't prescribe this without seeing patient every 90 days and that medication had to be prescribed for acute pain, etc.  She was really upset that she was being referred out.  Wanted a call back from you.  I asked her twice to confirm that she hadn't had any conversation with you about going to pain management because my notes in the past make reference to her being referred to pain management back in October.

## 2017-01-17 NOTE — Telephone Encounter (Signed)
Refill Norco 5/325 #60 once.

## 2017-01-17 NOTE — Telephone Encounter (Signed)
Printed

## 2017-01-18 NOTE — Telephone Encounter (Signed)
Patient picked up Rx today and Dr. Renold Genta spoke with patient to clear up any miscommunication regarding the Pain Management referral.

## 2017-01-26 ENCOUNTER — Other Ambulatory Visit: Payer: Self-pay | Admitting: Internal Medicine

## 2017-01-26 DIAGNOSIS — Z1231 Encounter for screening mammogram for malignant neoplasm of breast: Secondary | ICD-10-CM

## 2017-02-11 ENCOUNTER — Encounter: Payer: Self-pay | Admitting: Radiology

## 2017-02-11 ENCOUNTER — Ambulatory Visit
Admission: RE | Admit: 2017-02-11 | Discharge: 2017-02-11 | Disposition: A | Discharge status: Home / Self Care | Payer: 59 | Source: Ambulatory Visit | Attending: Internal Medicine | Admitting: Internal Medicine

## 2017-02-11 DIAGNOSIS — Z1231 Encounter for screening mammogram for malignant neoplasm of breast: Secondary | ICD-10-CM | POA: Diagnosis not present

## 2017-03-07 ENCOUNTER — Other Ambulatory Visit: Payer: Self-pay | Admitting: Cardiovascular Disease

## 2017-03-07 ENCOUNTER — Other Ambulatory Visit: Payer: Self-pay | Admitting: Internal Medicine

## 2017-03-07 DIAGNOSIS — I2510 Atherosclerotic heart disease of native coronary artery without angina pectoris: Secondary | ICD-10-CM

## 2017-03-07 DIAGNOSIS — I10 Essential (primary) hypertension: Secondary | ICD-10-CM

## 2017-03-07 NOTE — Telephone Encounter (Signed)
Verbal order by Dr. Renold Genta to refill Levothyroxine 16mcg #90, 1 refill.  Left voicemail for [facility].

## 2017-03-10 ENCOUNTER — Encounter: Payer: Self-pay | Admitting: Cardiovascular Disease

## 2017-03-10 DIAGNOSIS — M542 Cervicalgia: Secondary | ICD-10-CM | POA: Diagnosis not present

## 2017-03-10 DIAGNOSIS — M2550 Pain in unspecified joint: Secondary | ICD-10-CM | POA: Diagnosis not present

## 2017-03-10 DIAGNOSIS — G894 Chronic pain syndrome: Secondary | ICD-10-CM | POA: Diagnosis not present

## 2017-03-10 DIAGNOSIS — Z79891 Long term (current) use of opiate analgesic: Secondary | ICD-10-CM | POA: Diagnosis not present

## 2017-03-10 DIAGNOSIS — M47817 Spondylosis without myelopathy or radiculopathy, lumbosacral region: Secondary | ICD-10-CM | POA: Diagnosis not present

## 2017-03-25 ENCOUNTER — Other Ambulatory Visit: Payer: Self-pay | Admitting: *Deleted

## 2017-03-25 DIAGNOSIS — I2510 Atherosclerotic heart disease of native coronary artery without angina pectoris: Secondary | ICD-10-CM

## 2017-03-25 DIAGNOSIS — I10 Essential (primary) hypertension: Secondary | ICD-10-CM

## 2017-03-25 MED ORDER — ATORVASTATIN CALCIUM 40 MG PO TABS: ORAL_TABLET | ORAL | 0 refills | Status: DC

## 2017-03-28 ENCOUNTER — Ambulatory Visit (INDEPENDENT_AMBULATORY_CARE_PROVIDER_SITE_OTHER): Payer: 59 | Admitting: Cardiovascular Disease

## 2017-03-28 ENCOUNTER — Encounter: Payer: Self-pay | Admitting: Cardiovascular Disease

## 2017-03-28 VITALS — BP 142/74 | HR 68 | Ht 65.0 in | Wt 203.0 lb

## 2017-03-28 DIAGNOSIS — Z720 Tobacco use: Secondary | ICD-10-CM | POA: Diagnosis not present

## 2017-03-28 DIAGNOSIS — I25810 Atherosclerosis of coronary artery bypass graft(s) without angina pectoris: Secondary | ICD-10-CM | POA: Diagnosis not present

## 2017-03-28 DIAGNOSIS — I10 Essential (primary) hypertension: Secondary | ICD-10-CM | POA: Diagnosis not present

## 2017-03-28 MED ORDER — NITROGLYCERIN 0.4 MG SL SUBL: 0.4000 mg | SUBLINGUAL_TABLET | SUBLINGUAL | 3 refills | Status: DC | PRN

## 2017-03-28 NOTE — Progress Notes (Signed)
Cardiology Office Note Date:  03/28/2017   ID:  [name], [name] 13-Jun-1966, MRN [id number]  PCP:  Amy Showers, MD  Cardiologist:  Sherren Mocha, MD    Chief Complaint  Patient presents with  . essential hypertension     History of Present Illness: [name] is a 51 y.o. female who presents for presents for follow-up of coronary artery disease. She presented in 2013 with an acute anterior wall MI. Cardiac catheterization demonstrated patency of the LAD with subtotal occlusion of a very small diagonal branch. She had a significant cardiac enzyme rise, but her LV function was within normal limits. Presumably this was a vasospastic event. She returns today for followup.  The patient reports no changes in symptoms since her last evaluation here. She continues to have heart palpitations. These occur at rest. She relates them to stress. She has not had any sustained tachycardia-palpitations. She denies lightheadedness or dizziness. No exertional symptoms. No chest pain or shortness of breath. No leg swelling.  Past Medical History:  Diagnosis Date  . ADHD (attention deficit hyperactivity disorder)   . Anxiety   . Asthma   . CAD (coronary artery disease)    a. 11/2012 Acute Anterior STEMI/Cath: Nonobs dzs except subtotal occlusion of small, 47mm D2, EF 55-65%;  b. 11/2012  Echo: EF 60-65%, nl wall motion, Gr 2 DD.  Marland Kitchen Chronic kidney disease    in setting of HSP 2006  . Coronary vasospasm (HCC)    a. presumed  . Depression   . GERD (gastroesophageal reflux disease)   . 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 Hypertension    Controlled - she states she is on BP meds for HSP  . Hypothyroidism   . Migraines   . Sleep apnea    "lost 50#; no problems w/it now" (11/22/2012)  . 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 Tobacco abuse     Past Surgical History:  Procedure Laterality Date  . APPENDECTOMY  1970's  . CARDIAC CATHETERIZATION  11/22/2012   "first one" (11/22/2012)  . [place]; 1996; 1999  . DILATION AND CURETTAGE OF UTERUS  1990's  . LEFT HEART CATHETERIZATION WITH CORONARY ANGIOGRAM N/A 11/22/2012   Procedure: LEFT HEART CATHETERIZATION WITH CORONARY ANGIOGRAM;  Surgeon: Sherren Mocha, MD;  Location: [facility] CATH LAB;  Service: Cardiovascular;  Laterality: N/A;    Current Outpatient Prescriptions  Medication Sig Dispense Refill  . albuterol (PROVENTIL HFA;VENTOLIN HFA) 108 (90 Base) MCG/ACT inhaler Inhale 2 puffs into the lungs every 6 (six) hours as needed for wheezing or shortness of breath. 1 Inhaler 5  . ALPRAZolam (XANAX) 0.25 MG tablet Take 0.25 mg by mouth daily as needed for anxiety.    Marland Kitchen amLODipine (NORVASC) 5 MG tablet TAKE 1 TABLET BY MOUTH DAILY. 90 tablet 0  . aspirin EC 81 MG EC tablet Take 1 tablet (81 mg total) by mouth daily.    Marland Kitchen atorvastatin (LIPITOR) 40 MG tablet TAKE 1 TABLET BY MOUTH ONCE DAILY AT 6 PM 90 tablet 0  . calcium-vitamin D (OSCAL WITH D) 500-200 MG-UNIT per tablet Take 1 tablet by mouth daily.    Marland Kitchen desvenlafaxine (PRISTIQ) 50 MG 24 hr tablet TAKE 1 TABLET BY MOUTH ONCE DAILY  90 tablet 3  . enalapril (VASOTEC) 10 MG tablet TAKE 1 TABLET BY MOUTH DAILY. 90 tablet 0  . HYDROcodone-acetaminophen (NORCO/VICODIN) 5-325 MG tablet Take 1 tablet by mouth every 6 (six) hours as needed for moderate pain. 60 tablet 0  . levothyroxine (SYNTHROID, LEVOTHROID) 75 MCG tablet TAKE 1 TABLET BY MOUTH ONCE DAILY 90 tablet 0  . nitroGLYCERIN (NITROSTAT) 0.4 MG SL tablet Place 1 tablet (0.4 mg total) under the tongue every 5 (five) minutes as needed for chest pain. 25 tablet 3  . pantoprazole (PROTONIX) 40 MG tablet Take 1 tablet (40 mg total) by mouth 2 (two) times daily before a meal. 60  tablet 11   No current facility-administered medications for this visit.     Allergies:   Levaquin [levofloxacin] and Nsaids   Social History:  The patient  reports that she has been smoking Cigarettes.  She has a 12.50 pack-year smoking history. She has never used smokeless tobacco. She reports that she does not drink alcohol or use drugs.   Family History:  The patient's  family history includes Hypertension in her mother; Stroke in her father.    ROS:  Please see the history of present illness.  Otherwise, review of systems is positive for palpitations.  All other systems are reviewed and negative.    PHYSICAL EXAM: VS:  BP (!) 142/74   Pulse 68   Ht 5\' 5"  (1.651 m)   Wt 203 lb (92.1 kg)   BMI 33.78 kg/m  , BMI Body mass index is 33.78 kg/m. GEN: Well nourished, well developed, in no acute distress  HEENT: normal  Neck: no JVD, no masses. No carotid bruits Cardiac: RRR without murmur or gallop                Respiratory:  clear to auscultation bilaterally, normal work of breathing GI: soft, nontender, nondistended, + BS MS: no deformity or atrophy  Ext: no pretibial edema, pedal pulses 2+= bilaterally Skin: warm and dry, no rash Neuro:  Strength and sensation are intact Psych: euthymic mood, full affect  EKG:  EKG is ordered today. The ekg ordered today shows NSR 69 bpm, within normal limits  Recent Labs: 06/21/2016: ALT 23; Hemoglobin 14.9; Platelets 197 01/03/2017: BUN 9; Creat 0.75; Potassium 4.3; Sodium 141; TSH 0.67   Lipid Panel     Component Value Date/Time   CHOL 121 01/03/2017 1142   TRIG 86 01/03/2017 1142   HDL 49 (L) 01/03/2017 1142   CHOLHDL 2.5 01/03/2017 1142   VLDL 17 01/03/2017 1142   LDLCALC 55 01/03/2017 1142      Wt Readings from Last 3 Encounters:  03/28/17 203 lb (92.1 kg)  01/04/17 200 lb 12 oz (91.1 kg)  11/11/16 202 lb (91.6 kg)     ASSESSMENT AND PLAN: 1.  CAD, native vessel, without angina: Patient is stable on her current  medical program. She denies anginal symptoms. No changes are made today.  2. Hypertension: Blood pressure mildly elevated today. She hasn't taken her medicines yet this morning. She will continue on amlodipine, enalapril  3. Hyperlipidemia: Most recent lipids were reviewed from January 2018 with an LDL cholesterol less than 70 mg/dL. She continues on atorvastatin. Lifestyle modification counseling done. Discussed the importance of regular exercise.  4. Tobacco abuse: Smoking cessation counseling done. The patient is not ready to quit. Advised that I would be happy to help with the prescription for Chantix if she desires this down the road.   Current medicines  are reviewed with the patient today.  The patient does not have concerns regarding medicines.  Labs/ tests ordered today include:   Orders Placed This Encounter  Procedures  . EKG 12-Lead    Disposition:   FU one year  Signed, Sherren Mocha, MD  03/28/2017 5:48 PM    [address], [address], [place]  [address] Phone: [phone]; Fax: [phone]

## 2017-03-28 NOTE — Patient Instructions (Signed)

## 2017-03-28 NOTE — Progress Notes (Signed)
Not happy with BP reading. Please come by for OV and BP check when she has taken her meds at least 2 hours before

## 2017-03-29 NOTE — Progress Notes (Signed)
Spoke with patient and advised that Dr. Renold Genta wants to see her for a BP check in the office.  Scheduled a BP check for 5/3 @ 10:45.

## 2017-04-07 ENCOUNTER — Encounter: Payer: Self-pay | Admitting: Internal Medicine

## 2017-04-07 ENCOUNTER — Ambulatory Visit
Admission: RE | Admit: 2017-04-07 | Discharge: 2017-04-07 | Disposition: A | Discharge status: Home / Self Care | Payer: 59 | Source: Ambulatory Visit | Attending: Internal Medicine | Admitting: Internal Medicine

## 2017-04-07 ENCOUNTER — Ambulatory Visit (INDEPENDENT_AMBULATORY_CARE_PROVIDER_SITE_OTHER): Payer: 59 | Admitting: Internal Medicine

## 2017-04-07 VITALS — BP 128/62 | HR 73 | Temp 97.7°F | Ht 65.0 in | Wt 203.0 lb

## 2017-04-07 DIAGNOSIS — I10 Essential (primary) hypertension: Secondary | ICD-10-CM | POA: Diagnosis not present

## 2017-04-07 DIAGNOSIS — J22 Unspecified acute lower respiratory infection: Secondary | ICD-10-CM

## 2017-04-07 DIAGNOSIS — G894 Chronic pain syndrome: Secondary | ICD-10-CM | POA: Diagnosis not present

## 2017-04-07 DIAGNOSIS — M542 Cervicalgia: Secondary | ICD-10-CM | POA: Diagnosis not present

## 2017-04-07 DIAGNOSIS — Z87891 Personal history of nicotine dependence: Secondary | ICD-10-CM

## 2017-04-07 DIAGNOSIS — J9801 Acute bronchospasm: Secondary | ICD-10-CM | POA: Diagnosis not present

## 2017-04-07 DIAGNOSIS — R062 Wheezing: Secondary | ICD-10-CM

## 2017-04-07 DIAGNOSIS — F439 Reaction to severe stress, unspecified: Secondary | ICD-10-CM

## 2017-04-07 DIAGNOSIS — R05 Cough: Secondary | ICD-10-CM | POA: Diagnosis not present

## 2017-04-07 DIAGNOSIS — I519 Heart disease, unspecified: Secondary | ICD-10-CM | POA: Diagnosis not present

## 2017-04-07 DIAGNOSIS — M2550 Pain in unspecified joint: Secondary | ICD-10-CM | POA: Diagnosis not present

## 2017-04-07 DIAGNOSIS — M47817 Spondylosis without myelopathy or radiculopathy, lumbosacral region: Secondary | ICD-10-CM | POA: Diagnosis not present

## 2017-04-07 MED ORDER — PREDNISONE 10 MG PO TABS: ORAL_TABLET | ORAL | 0 refills | Status: DC

## 2017-04-07 NOTE — Patient Instructions (Signed)
Continue course of Biaxin until completed. Prednisone has been called in should she need it for wheezing. Symbicort inhaler given and continue to use albuterol inhaler. Please stop smoking. Have chest x-ray. Continue same antihypertensive medications.

## 2017-04-07 NOTE — Progress Notes (Signed)
   Subjective:    Patient ID: [name], female    DOB: 19-Jun-1966, 51 y.o.   MRN: [id number]  HPI 51 year old Female with CAD and HTN  Came down with URI recently.Had refill on Biaxin and started that.  Work is stressful. Stress at home. One son found with marijuana. Another son moving to [place] for the summer and she has to help him move out of the home. One son got struck in the mouth and lost 2 teeth and that will be expensive.  Has noticed some cough and wheezing.  Dr. Burt Knack wanted her blood pressure rechecked since it was elevated at her last visit with him. She had not taken her meds very long before seeing him.  Is now going to pain management and has an appointment today.  Continues to smoke.    Review of Systems see above     Objective:   Physical Exam Neck is supple without JVD thyromegaly or carotid bruits.  Cardiac exam regular rate and rhythm normal S1 and S2. She has  wheezing in right chest. Chest x-ray ordered.       Assessment & Plan:  Acute bronchitis  Acute bronchospasm  Situational stress  Blood pressure stable at present time  History of coronary artery disease  Plan: To have chest x-ray and finish course of Biaxin. Given samples of Symbicort inhaler 160/4.51 spray by mouth every 12 hours in addition to albuterol inhaler. Should she need it, called in a course of prednisone 10 mg #21 tablets: From 60 mg to 0 mg over 7 days. Continue same antihypertensive medication his blood pressure is stable today.

## 2017-04-19 ENCOUNTER — Encounter (HOSPITAL_BASED_OUTPATIENT_CLINIC_OR_DEPARTMENT_OTHER): Payer: Self-pay

## 2017-04-19 DIAGNOSIS — G4710 Hypersomnia, unspecified: Secondary | ICD-10-CM

## 2017-04-19 DIAGNOSIS — R0683 Snoring: Secondary | ICD-10-CM

## 2017-04-19 DIAGNOSIS — R5383 Other fatigue: Secondary | ICD-10-CM

## 2017-05-05 DIAGNOSIS — M2550 Pain in unspecified joint: Secondary | ICD-10-CM | POA: Diagnosis not present

## 2017-05-05 DIAGNOSIS — G894 Chronic pain syndrome: Secondary | ICD-10-CM | POA: Diagnosis not present

## 2017-05-05 DIAGNOSIS — M542 Cervicalgia: Secondary | ICD-10-CM | POA: Diagnosis not present

## 2017-05-05 DIAGNOSIS — M47817 Spondylosis without myelopathy or radiculopathy, lumbosacral region: Secondary | ICD-10-CM | POA: Diagnosis not present

## 2017-05-10 DIAGNOSIS — H524 Presbyopia: Secondary | ICD-10-CM | POA: Diagnosis not present

## 2017-05-10 DIAGNOSIS — H5213 Myopia, bilateral: Secondary | ICD-10-CM | POA: Diagnosis not present

## 2017-05-24 DIAGNOSIS — L820 Inflamed seborrheic keratosis: Secondary | ICD-10-CM | POA: Diagnosis not present

## 2017-05-24 DIAGNOSIS — D2261 Melanocytic nevi of right upper limb, including shoulder: Secondary | ICD-10-CM | POA: Diagnosis not present

## 2017-05-24 DIAGNOSIS — D225 Melanocytic nevi of trunk: Secondary | ICD-10-CM | POA: Diagnosis not present

## 2017-05-24 DIAGNOSIS — L570 Actinic keratosis: Secondary | ICD-10-CM | POA: Diagnosis not present

## 2017-05-24 DIAGNOSIS — D1801 Hemangioma of skin and subcutaneous tissue: Secondary | ICD-10-CM | POA: Diagnosis not present

## 2017-05-24 DIAGNOSIS — L821 Other seborrheic keratosis: Secondary | ICD-10-CM | POA: Diagnosis not present

## 2017-06-09 ENCOUNTER — Other Ambulatory Visit: Payer: Self-pay | Admitting: Cardiovascular Disease

## 2017-06-09 DIAGNOSIS — I10 Essential (primary) hypertension: Secondary | ICD-10-CM

## 2017-06-09 DIAGNOSIS — I2510 Atherosclerotic heart disease of native coronary artery without angina pectoris: Secondary | ICD-10-CM

## 2017-06-19 ENCOUNTER — Ambulatory Visit (HOSPITAL_BASED_OUTPATIENT_CLINIC_OR_DEPARTMENT_OTHER): Payer: 59 | Attending: Physical Medicine and Rehabilitation | Admitting: Internal Medicine

## 2017-06-19 VITALS — Ht 65.0 in | Wt 203.0 lb

## 2017-06-19 DIAGNOSIS — R5383 Other fatigue: Secondary | ICD-10-CM

## 2017-06-19 DIAGNOSIS — G4710 Hypersomnia, unspecified: Secondary | ICD-10-CM

## 2017-06-19 DIAGNOSIS — R0683 Snoring: Secondary | ICD-10-CM

## 2017-06-19 DIAGNOSIS — Z79899 Other long term (current) drug therapy: Secondary | ICD-10-CM | POA: Diagnosis not present

## 2017-06-19 DIAGNOSIS — G4736 Sleep related hypoventilation in conditions classified elsewhere: Secondary | ICD-10-CM | POA: Diagnosis not present

## 2017-06-19 DIAGNOSIS — G4733 Obstructive sleep apnea (adult) (pediatric): Secondary | ICD-10-CM | POA: Diagnosis not present

## 2017-06-19 DIAGNOSIS — Z6833 Body mass index (BMI) 33.0-33.9, adult: Secondary | ICD-10-CM | POA: Diagnosis not present

## 2017-06-19 DIAGNOSIS — E669 Obesity, unspecified: Secondary | ICD-10-CM | POA: Diagnosis not present

## 2017-06-21 ENCOUNTER — Other Ambulatory Visit: Payer: 59 | Admitting: Internal Medicine

## 2017-06-21 DIAGNOSIS — Z0000 Encounter for general adult medical examination without abnormal findings: Secondary | ICD-10-CM | POA: Diagnosis not present

## 2017-06-21 DIAGNOSIS — Z1321 Encounter for screening for nutritional disorder: Secondary | ICD-10-CM | POA: Diagnosis not present

## 2017-06-21 DIAGNOSIS — Z13220 Encounter for screening for lipoid disorders: Secondary | ICD-10-CM

## 2017-06-21 DIAGNOSIS — R7302 Impaired glucose tolerance (oral): Secondary | ICD-10-CM | POA: Diagnosis not present

## 2017-06-21 DIAGNOSIS — I10 Essential (primary) hypertension: Secondary | ICD-10-CM

## 2017-06-21 DIAGNOSIS — E039 Hypothyroidism, unspecified: Secondary | ICD-10-CM

## 2017-06-21 LAB — CBC WITH DIFFERENTIAL/PLATELET
Basophils Absolute: 0 cells/uL (ref 0–200)
Basophils Relative: 0 %
Eosinophils Absolute: 222 cells/uL (ref 15–500)
Eosinophils Relative: 3 %
HCT: 45.9 % — ABNORMAL HIGH (ref 35.0–45.0)
Hemoglobin: 15.2 g/dL (ref 11.7–15.5)
Lymphocytes Relative: 32 %
Lymphs Abs: 2368 cells/uL (ref 850–3900)
MCH: 31.5 pg (ref 27.0–33.0)
MCHC: 33.1 g/dL (ref 32.0–36.0)
MCV: 95.2 fL (ref 80.0–100.0)
MPV: 11.5 fL (ref 7.5–12.5)
Monocytes Absolute: 518 cells/uL (ref 200–950)
Monocytes Relative: 7 %
Neutro Abs: 4292 cells/uL (ref 1500–7800)
Neutrophils Relative %: 58 %
Platelets: 194 10*3/uL (ref 140–400)
RBC: 4.82 MIL/uL (ref 3.80–5.10)
RDW: 12.8 % (ref 11.0–15.0)
WBC: 7.4 10*3/uL (ref 3.8–10.8)

## 2017-06-21 LAB — COMPLETE METABOLIC PANEL WITH GFR
ALT: 22 U/L (ref 6–29)
AST: 21 U/L (ref 10–35)
Albumin: 3.9 g/dL (ref 3.6–5.1)
Alkaline Phosphatase: 91 U/L (ref 33–130)
BUN: 7 mg/dL (ref 7–25)
CO2: 24 mmol/L (ref 20–31)
Calcium: 8.9 mg/dL (ref 8.6–10.4)
Chloride: 104 mmol/L (ref 98–110)
Creat: 0.82 mg/dL (ref 0.50–1.05)
GFR, Est African American: >89 mL/min (ref >=60)
GFR, Est Non African American: 83 mL/min (ref >=60)
Glucose, Bld: 101 mg/dL — ABNORMAL HIGH (ref 65–99)
Potassium: 4.4 mmol/L (ref 3.5–5.3)
Sodium: 140 mmol/L (ref 135–146)
Total Bilirubin: 0.5 mg/dL (ref 0.2–1.2)
Total Protein: 5.9 g/dL — ABNORMAL LOW (ref 6.1–8.1)

## 2017-06-21 LAB — TSH: TSH: 1.08 mIU/L

## 2017-06-21 LAB — LIPID PANEL
Cholesterol: 120 mg/dL (ref <200)
HDL: 47 mg/dL — ABNORMAL LOW (ref >50)
LDL Cholesterol: 50 mg/dL (ref <100)
Total CHOL/HDL Ratio: 2.6 Ratio (ref <5.0)
Triglycerides: 115 mg/dL (ref <150)
VLDL: 23 mg/dL (ref <30)

## 2017-06-22 LAB — HEMOGLOBIN A1C
Hgb A1c MFr Bld: 5.6 % (ref <5.7)
Mean Plasma Glucose: 114 mg/dL

## 2017-06-22 LAB — MICROALBUMIN / CREATININE URINE RATIO
Creatinine, Urine: 53 mg/dL (ref 20–320)
Microalb Creat Ratio: 15 mcg/mg creat (ref <30)
Microalb, Ur: 0.8 mg/dL

## 2017-06-22 LAB — VITAMIN D 25 HYDROXY (VIT D DEFICIENCY, FRACTURES): Vit D, 25-Hydroxy: 29 ng/mL — ABNORMAL LOW (ref 30–100)

## 2017-06-23 ENCOUNTER — Encounter: Payer: Self-pay | Admitting: Internal Medicine

## 2017-06-23 ENCOUNTER — Ambulatory Visit (INDEPENDENT_AMBULATORY_CARE_PROVIDER_SITE_OTHER): Payer: 59 | Admitting: Internal Medicine

## 2017-06-23 VITALS — BP 104/70 | HR 80 | Temp 98.4°F | Ht 64.25 in | Wt 199.0 lb

## 2017-06-23 DIAGNOSIS — R002 Palpitations: Secondary | ICD-10-CM

## 2017-06-23 DIAGNOSIS — G4733 Obstructive sleep apnea (adult) (pediatric): Secondary | ICD-10-CM | POA: Diagnosis not present

## 2017-06-23 DIAGNOSIS — Z87891 Personal history of nicotine dependence: Secondary | ICD-10-CM | POA: Diagnosis not present

## 2017-06-23 DIAGNOSIS — M791 Myalgia: Secondary | ICD-10-CM

## 2017-06-23 DIAGNOSIS — E784 Other hyperlipidemia: Secondary | ICD-10-CM | POA: Diagnosis not present

## 2017-06-23 DIAGNOSIS — Z0000 Encounter for general adult medical examination without abnormal findings: Secondary | ICD-10-CM

## 2017-06-23 DIAGNOSIS — F32A Depression, unspecified: Secondary | ICD-10-CM

## 2017-06-23 DIAGNOSIS — Z8679 Personal history of other diseases of the circulatory system: Secondary | ICD-10-CM | POA: Diagnosis not present

## 2017-06-23 DIAGNOSIS — E8881 Metabolic syndrome: Secondary | ICD-10-CM | POA: Diagnosis not present

## 2017-06-23 DIAGNOSIS — F329 Major depressive disorder, single episode, unspecified: Secondary | ICD-10-CM

## 2017-06-23 DIAGNOSIS — K219 Gastro-esophageal reflux disease without esophagitis: Secondary | ICD-10-CM

## 2017-06-23 DIAGNOSIS — F439 Reaction to severe stress, unspecified: Secondary | ICD-10-CM | POA: Diagnosis not present

## 2017-06-23 DIAGNOSIS — Z8709 Personal history of other diseases of the respiratory system: Secondary | ICD-10-CM

## 2017-06-23 DIAGNOSIS — F419 Anxiety disorder, unspecified: Secondary | ICD-10-CM

## 2017-06-23 DIAGNOSIS — G8929 Other chronic pain: Secondary | ICD-10-CM

## 2017-06-23 DIAGNOSIS — R7302 Impaired glucose tolerance (oral): Secondary | ICD-10-CM | POA: Diagnosis not present

## 2017-06-23 DIAGNOSIS — M7918 Myalgia, other site: Secondary | ICD-10-CM

## 2017-06-23 DIAGNOSIS — E7849 Other hyperlipidemia: Secondary | ICD-10-CM

## 2017-06-23 DIAGNOSIS — I10 Essential (primary) hypertension: Secondary | ICD-10-CM

## 2017-06-23 DIAGNOSIS — I252 Old myocardial infarction: Secondary | ICD-10-CM

## 2017-06-23 LAB — POCT URINALYSIS DIPSTICK
Bilirubin, UA: NEGATIVE
Blood, UA: NEGATIVE
Glucose, UA: NEGATIVE
Ketones, UA: NEGATIVE
Leukocytes, UA: NEGATIVE
Nitrite, UA: NEGATIVE
Protein, UA: NEGATIVE
Spec Grav, UA: 1.015 (ref 1.010–1.025)
Urobilinogen, UA: 0.2 E.U./dL
pH, UA: 6.5 (ref 5.0–8.0)

## 2017-06-25 DIAGNOSIS — G4733 Obstructive sleep apnea (adult) (pediatric): Secondary | ICD-10-CM

## 2017-06-25 NOTE — Patient Instructions (Signed)
It was a pleasure to see you today. Recommend diet exercise and weight loss. 24-hour Holter monitor recommended for palpitations. Return in 6 months.

## 2017-06-25 NOTE — Procedures (Signed)
Patient Name: [name], [name] Date: 06/19/2017 Gender: Female D.O.B: 1966-04-07 Age (years): 51 Referring Provider: Margaretha Sheffield Height (inches): 7 Interpreting Physician: Baird Lyons MD, ABSM Weight (lbs): 200 RPSGT: Jonna Coup BMI: 33 MRN: [id number] Neck Size: 15.00 CLINICAL INFORMATION Sleep Study Type: NPSG  Indication for sleep study: Excessive Daytime Sleepiness, Fatigue, Obesity, OSA, Re-Evaluation, Snoring, Witnessed Apneas  Epworth Sleepiness Score: 9  SLEEP STUDY TECHNIQUE As per the AASM Manual for the Scoring of Sleep and Associated Events v2.3 (April 2016) with a hypopnea requiring 4% desaturations.  The channels recorded and monitored were frontal, central and occipital EEG, electrooculogram (EOG), submentalis EMG (chin), nasal and oral airflow, thoracic and abdominal wall motion, anterior tibialis EMG, snore microphone, electrocardiogram, and pulse oximetry.  MEDICATIONS Medications self-administered by patient taken the night of the study : atorvastatin, Wellbutrin, enalapril  SLEEP ARCHITECTURE The study was initiated at 9:37:09 PM and ended at 4:14:05 AM.  Sleep onset time was 111.8 minutes and the sleep efficiency was 48.2%. The total sleep time was 191.1 minutes.  Stage REM latency was 227.0 minutes.  The patient spent 4.71% of the night in stage N1 sleep, 84.04% in stage N2 sleep, 0.00% in stage N3 and 11.25% in REM.  Alpha intrusion was absent.  Supine sleep was 18.39%.  RESPIRATORY PARAMETERS The overall apnea/hypopnea index (AHI) was 6.3 per hour. There were 0 total apneas, including 0 obstructive, 0 central and 0 mixed apneas. There were 20 hypopneas and 0 RERAs.  The AHI during Stage REM sleep was 8.4 per hour.  AHI while supine was 0.0 per hour.  The mean oxygen saturation was 90.29%. The minimum SpO2 during sleep was 84.00%.  Moderate snoring was noted during this study.  CARDIAC DATA The 2 lead EKG  demonstrated sinus rhythm. The mean heart rate was 24.20 beats per minute. Other EKG findings include: None.  LEG MOVEMENT DATA The total PLMS were 258 with a resulting PLMS index of 80.99. Associated arousal with leg movement index was 27.3 .  IMPRESSIONS - Mild obstructive sleep apnea occurred during this study (AHI = 6.3/h). - No significant central sleep apnea occurred during this study (CAI = 0.0/h). - Mild oxygen desaturation was noted during this study (Min O2 = 84.00%, Mean 90,29%). Time with saturation </= 88% was 35.2 minutes. - The patient snored with Moderate snoring volume. - No cardiac abnormalities were noted during this study. - Severe periodic limb movements of sleep occurred during the study. Associated arousals were significant.  DIAGNOSIS - Obstructive Sleep Apnea (327.23 [G47.33 ICD-10]) - Nocturnal Hypoxemia (327.26 [G47.36 ICD-10]  RECOMMENDATIONS - Very mild obstructive sleep apnea. Conservative options such as weight loss and sleep off flat of back might be sufficient, based on clinical judgment. - Hypoxemia with 35.2 minutes recorded  on room air with saturation </= 88%, and a history of heart disease, may justify a trial of CPAP in spite of the relatively low AHI. Otherwise consider supplemental O2 during sleep.- Avoid alcohol, sedatives and other CNS depressants that may worsen sleep apnea and disrupt normal sleep architecture. - Sleep hygiene should be reviewed to assess factors that may improve sleep quality. - Weight management and regular exercise should be initiated or continued if appropriate.  [Electronically signed] 06/25/2017 12:41 PM  Baird Lyons MD, [place], American Board of Sleep Medicine   NPI: [id number]  [place], American Board of Sleep Medicine  ELECTRONICALLY SIGNED ON:  06/25/2017, 12:35 PM [place] PH: [phone]   FX: [phone] [id number] ACCREDITED BY THE AMERICAN ACADEMY OF SLEEP  MEDICINE

## 2017-06-25 NOTE — Progress Notes (Signed)
Subjective:    Patient ID: [name], female    DOB: 03-18-1966, 51 y.o.   MRN: [id number]  HPI 51 year old White Female in today for health maintenance exam and evaluation of medical issues. Continues to smoke about a half pack of cigarettes daily.  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 She had subtotal occlusion of a very small branch of the diagonal and no other significant disease. She underwent cardiac catheterization and did well.  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 Hemoglobin dropped to 9.4 g. She developed purpuric lesions on her lower extremities and maroon-colored stools. CT scan showed diffuse colitis and she developed ascites. Was transferred to [facility] after developing take-colored urine with hematuria. 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 She was treated with high-dose steroids which she remained on for several months. Was treated by Dr. Marlana Salvage at [facility] in the Rheumatology department. She has never had a recurrence.  In September 2009, she was hospitalized for gastroenteritis secondary to C. difficile infection.  In 2009 she had a normal cardiac nuclear study. In 2006 she had a 2-D echocardiogram that was normal.  History of sleep apnea recently evaluated by Dr. Annamaria Boots.Recent sleep study showed mild obstructive sleep apnea with moderate snoring. Weight loss recommended. Overall apnea/hypoxia index was 6.3 per hour. There were 20 hyponea episodes. No total apneas. Trial of  CPAP suggested at night or at least supplemental oxygen.  History of migraine headaches. History of cervical radiculopathy 2012. History of an osteophyte causing some mild foraminal stenosis and impingement on MRI which has been evaluated by Dr. Vertell Limber.  For some time, she was maintained on Adderall for attention deficit disorder but this was discontinued after her MI.  History of endometrial polyp causing menorrhagia removed by Dr. Sander Radon November 2003. History of [place].  Appendectomy in the early 1970s.  No history of fractures.  Social history: Married with 3 sons. Does not consume alcohol. Has smoked for well over 20 years. 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 Both parents with history of hypertension. Father with history of stroke and has been falling recently. 2 brothers in good health. No sisters.    Review of Systems chronic arthralgias and fatigue. Is treated at pain management clinic for musculoskeletal pain. New issue today is palpitations.. Recommend 24-hour Holter monitor    Objective:   Physical Exam  Constitutional: She is oriented to person, place, and time. She appears well-developed and well-nourished. No distress.  HENT:  Head: Normocephalic and atraumatic.  Right Ear: External ear normal.  Left Ear: External ear normal.  Mouth/Throat: Oropharynx is clear and moist.  Eyes: Pupils are equal, round, and reactive to light. Conjunctivae and EOM are normal. Right eye exhibits no discharge. Left eye exhibits no discharge. No scleral icterus.  Neck: Neck supple. No JVD present. No thyromegaly present.  Cardiovascular: Normal rate, regular rhythm, normal heart sounds and intact distal pulses.   No murmur heard. Pulmonary/Chest: Effort normal and breath sounds normal. No respiratory distress. She has no wheezes. She has no rales. She exhibits no tenderness.  Breasts normal female without masses  Abdominal: Soft. Bowel sounds are normal. She exhibits no distension and no mass. There is no tenderness. There is no rebound and no guarding.  Genitourinary:  Genitourinary Comments: Deferred to GYN  Musculoskeletal: She exhibits no  edema.  Lymphadenopathy:    She has no cervical adenopathy.  Neurological: She is alert and oriented to person, place, and time. She has normal reflexes. No cranial nerve  deficit. Coordination normal.  Skin: Skin is warm and dry. No rash noted. She is not diaphoretic.  Psychiatric: She has a normal mood and affect. Her behavior is normal. Judgment and thought content normal.  Vitals reviewed.         Assessment & Plan:  History of coronary artery disease status post anterior MI December 2013-followed by Dr. Burt Knack  Essential hypertension-stable on current regimen  Hypothyroidism-stable on thyroid replacement  Obesity-needs to lose weight  Mild sleep apnea-trial of either supplemental oxygen or C Pap recommended by Pulmonologist. Snoring.  History of smoking  Anxiety depression  Musculoskeletal pain treated by pain management clinic  GE reflux  New issue today is palpitations-recommend 24-hour Holter monitor. Smoking aggravates palpitations.   History of COPD/reactive airways disease related to acute upper respiratory infections  Impaired glucose tolerance-hemoglobin A1c excellent at 5.6%. Fasting glucose 101  Situational stress at work and with children  Vitamin D deficiency. Recommend 2000 units vitamin D 3 daily  Plan: Continue to recommend diet exercise weight loss and quitting smoking. Return in 6 months. Take vitamin D supplement. Have annual mammogram.

## 2017-06-27 ENCOUNTER — Other Ambulatory Visit: Payer: Self-pay

## 2017-06-27 DIAGNOSIS — I10 Essential (primary) hypertension: Secondary | ICD-10-CM

## 2017-06-27 DIAGNOSIS — I2510 Atherosclerotic heart disease of native coronary artery without angina pectoris: Secondary | ICD-10-CM

## 2017-06-27 MED ORDER — AMLODIPINE BESYLATE 5 MG PO TABS: 5.0000 mg | ORAL_TABLET | Freq: Every day | ORAL | 0 refills | Status: DC

## 2017-06-29 ENCOUNTER — Telehealth: Payer: Self-pay | Admitting: Cardiovascular Disease

## 2017-06-29 DIAGNOSIS — R002 Palpitations: Secondary | ICD-10-CM

## 2017-06-29 NOTE — Telephone Encounter (Signed)
I spoke with the pt and she states that she saw her PCP last week and due to an increased frequency of palpitations the PCP recommended a 24 hour holter monitor.  The pt states that she discussed her palpitations with Dr Burt Knack at her last visit but they were not bothersome.  Now they are increasing in frequency and occurring almost daily.  The pt would like to know if she can get an order for a monitor.  I made her aware that due to timing of events a 48 hour holter monitor may be a better option. I will forward this message to Dr Burt Knack for review.

## 2017-06-29 NOTE — Telephone Encounter (Signed)
New message    Patient c/o Palpitations:  High priority if patient c/o lightheadedness and shortness of breath.  1. How long have you been having palpitations? She said she mentioned them last time she saw Dr. Burt Knack, but have gotten worse over the last month. She states she has felt them every day for the last 3 weeks.  2. Are you currently experiencing lightheadedness and shortness of breath? No    3. Have you checked your BP and heart rate? (document readings) No  4. Are you experiencing any other symptoms? No

## 2017-06-29 NOTE — Telephone Encounter (Signed)
Agree - please do 48 hour monitor to evaluate heart palpitations.

## 2017-06-30 NOTE — Telephone Encounter (Signed)
Order placed for 48 hour holter monitor.

## 2017-07-06 DIAGNOSIS — G894 Chronic pain syndrome: Secondary | ICD-10-CM | POA: Diagnosis not present

## 2017-07-06 DIAGNOSIS — M2550 Pain in unspecified joint: Secondary | ICD-10-CM | POA: Diagnosis not present

## 2017-07-06 DIAGNOSIS — M47817 Spondylosis without myelopathy or radiculopathy, lumbosacral region: Secondary | ICD-10-CM | POA: Diagnosis not present

## 2017-07-06 DIAGNOSIS — M542 Cervicalgia: Secondary | ICD-10-CM | POA: Diagnosis not present

## 2017-07-06 NOTE — Telephone Encounter (Signed)
Holter monitor appointment scheduled on 07/19/2017.

## 2017-07-11 ENCOUNTER — Other Ambulatory Visit: Payer: Self-pay | Admitting: Cardiovascular Disease

## 2017-07-11 ENCOUNTER — Other Ambulatory Visit: Payer: Self-pay | Admitting: Internal Medicine

## 2017-07-11 DIAGNOSIS — I2510 Atherosclerotic heart disease of native coronary artery without angina pectoris: Secondary | ICD-10-CM

## 2017-07-11 DIAGNOSIS — I10 Essential (primary) hypertension: Secondary | ICD-10-CM

## 2017-07-19 ENCOUNTER — Ambulatory Visit (INDEPENDENT_AMBULATORY_CARE_PROVIDER_SITE_OTHER): Payer: 59

## 2017-07-19 DIAGNOSIS — R002 Palpitations: Secondary | ICD-10-CM | POA: Diagnosis not present

## 2017-07-21 ENCOUNTER — Telehealth: Payer: Self-pay | Admitting: Pulmonary Disease

## 2017-07-21 NOTE — Telephone Encounter (Signed)
lmtcb x1 for pt. 

## 2017-07-21 NOTE — Telephone Encounter (Signed)
Patient returned call, CB is [phone].

## 2017-07-21 NOTE — Telephone Encounter (Signed)
Spoke with pt. We did not order the test, her pain management doctor did. They will need to be the ones to give her the results. Nothing further was needed.

## 2017-07-25 ENCOUNTER — Ambulatory Visit: Payer: 59 | Admitting: Physician Assistant

## 2017-08-02 DIAGNOSIS — Z01419 Encounter for gynecological examination (general) (routine) without abnormal findings: Secondary | ICD-10-CM | POA: Diagnosis not present

## 2017-08-02 DIAGNOSIS — Z6833 Body mass index (BMI) 33.0-33.9, adult: Secondary | ICD-10-CM | POA: Diagnosis not present

## 2017-08-15 NOTE — Progress Notes (Signed)
Cardiology Office Note:    Date:  08/16/2017   ID:  [name], DOB [birth year], MRN [id number]  PCP:  Elby Showers, MD  Cardiologist:  Dr Sherren Mocha  Referring MD: Elby Showers, MD   Chief Complaint  Patient presents with  . Follow-up    Palpitations    History of Present Illness:    [name] is a 51 y.o. female with a past medical history significant for acute anterior MI in 2013 with subtotal occlusion of a very small diagonal branch. This was presumably a vasospastic event. Her LV function was normal. She also has history of hypertension, ongoing tobacco use, recent diagnosis of sleep apnea, hypothyroidism, GERD, GI bleed in 2006, asthma, and anxiety.  She has had heart palpitations which occur at rest and the patient relates to stress. She has not had any sustained tachycardia.  She had a 48-hour Holter monitor done that was finalized on 08/08/17 and showed Sinus rhythm with periods of sinus bradycardia and sinus tachycardia, single PVC's and PAC's, no sustained arrhythmia or pathologic pauses. The patient brings in a cardiac strip that she had done at work which shows occasional PVCs. The patient has tried to reduce her triggering factors. She continues to smoke but she has cut out caffeine and worked on stress reduction. She recognizes that her palpitations mostly occur at work where she is under stressful condition related to one of her coworkers. She has participated in physical activities like push mowing her yard out in the heat to see if this would induce PVCs, however she does not notice PVCs with physical activity. She has no lightheadedness, shortness of breath, chest discomfort, syncope/near syncope, orthopnea or edema. Since she has been controlling her triggers she has noted a decrease in her palpitations.  Her blood pressure had been elevated on her last cardiology appointment. She had not taken her blood pressure medicines on that day.  She had a recheck of her blood pressure at her PCP after taking her blood pressure medications and her blood pressure was acceptable at 128/62 and today is 108/60.  Past Medical History:  Diagnosis Date  . ADHD (attention deficit hyperactivity disorder)   . Anxiety   . Asthma   . CAD (coronary artery disease)    a. 11/2012 Acute Anterior STEMI/Cath: Nonobs dzs except subtotal occlusion of small, 38mm D2, EF 55-65%;  b. 11/2012  Echo: EF 60-65%, nl wall motion, Gr 2 DD.  Marland Kitchen Chronic kidney disease    in setting of HSP 2006  . Coronary vasospasm (HCC)    a. presumed  . Depression   . GERD (gastroesophageal reflux disease)   . 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 Hypertension    Controlled - she states she is on BP meds for HSP  . Hypothyroidism   . Migraines   . Sleep apnea    "lost 50#; no problems w/it now" (11/22/2012)  . 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 Tobacco abuse     Past Surgical History:  Procedure Laterality Date  . APPENDECTOMY  1970's  . CARDIAC CATHETERIZATION  11/22/2012   "first one" (11/22/2012)  . [place]; 1996; 1999  . DILATION AND CURETTAGE OF UTERUS  1990's  .  LEFT HEART CATHETERIZATION WITH CORONARY ANGIOGRAM N/A 11/22/2012   Procedure: LEFT HEART CATHETERIZATION WITH CORONARY ANGIOGRAM;  Surgeon: Sherren Mocha, MD;  Location: [facility] CATH LAB;  Service: Cardiovascular;  Laterality: N/A;    Current Medications: Current Meds  Medication Sig  . albuterol (PROVENTIL HFA;VENTOLIN HFA) 108 (90 Base) MCG/ACT inhaler Inhale 2 puffs into the lungs every 6 (six) hours as needed for wheezing or shortness of breath.  . ALPRAZolam (XANAX) 0.25 MG tablet Take 0.25 mg by mouth daily as needed for anxiety.  Marland Kitchen amLODipine (NORVASC) 5 MG  tablet Take 1 tablet (5 mg total) by mouth daily.  Marland Kitchen aspirin EC 81 MG EC tablet Take 1 tablet (81 mg total) by mouth daily.  Marland Kitchen atorvastatin (LIPITOR) 40 MG tablet TAKE 1 TABLET BY MOUTH ONCE DAILY AT 6 PM  . calcium-vitamin D (OSCAL WITH D) 500-200 MG-UNIT per tablet Take 1 tablet by mouth daily.  . Cholecalciferol (VITAMIN D3) 2000 units TABS Take 2,000 Units by mouth daily.  Marland Kitchen desvenlafaxine (PRISTIQ) 50 MG 24 hr tablet TAKE 1 TABLET BY MOUTH ONCE DAILY  . enalapril (VASOTEC) 10 MG tablet TAKE 1 TABLET BY MOUTH DAILY.  Marland Kitchen HYDROcodone-acetaminophen (NORCO/VICODIN) 5-325 MG tablet Take 1 tablet by mouth every 6 (six) hours as needed for moderate pain.  Marland Kitchen levothyroxine (SYNTHROID, LEVOTHROID) 75 MCG tablet TAKE 1 TABLET BY MOUTH ONCE DAILY  . nitroGLYCERIN (NITROSTAT) 0.4 MG SL tablet Place 1 tablet (0.4 mg total) under the tongue every 5 (five) minutes as needed for chest pain.  . pantoprazole (PROTONIX) 40 MG tablet TAKE 1 TABLET BY MOUTH 2 TIMES DAILY BEFORE A MEAL.     Allergies:   Levaquin [levofloxacin] and Nsaids   Social History   Social History  . Marital status: Married    Spouse name: N/A  . Number of children: N/A  . Years of education: N/A   Occupational History  . 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 Smoking status: Current Every Day Smoker    Packs/day: 0.50    Years: 25.00    Types: Cigarettes  . Smokeless tobacco: Never Used  . Alcohol use No  . Drug use: No  . Sexual activity: Yes   Other Topics Concern  . None   Social History Narrative  . None     Family History: The patient's family history includes Hypertension in her mother; Stroke in her father. There is no history of Heart disease. ROS:   Please see the history of present illness.     All other systems reviewed and are negative.  EKGs/Labs/Other Studies Reviewed:    The following studies were reviewed today:  Echocardiogram 11/23/2012: EF 60-65%, grade  2 diastolic dysfunction, no valvular abnormalities  EKG:  EKG is  ordered today.  The ekg ordered today demonstrates Normal sinus rhythm at 66 bpm. QTC 404 ms  Recent Labs: 06/21/2017: ALT 22; BUN 7; Creat 0.82; Hemoglobin 15.2; Platelets 194; Potassium 4.4; Sodium 140; TSH 1.08   Recent Lipid Panel    Component Value Date/Time   CHOL 120 06/21/2017 1014   TRIG 115 06/21/2017 1014   HDL 47 (L) 06/21/2017 1014   CHOLHDL 2.6 06/21/2017 1014   VLDL 23 06/21/2017 1014   LDLCALC 50 06/21/2017 1014    Physical Exam:    VS:  BP 108/60   Pulse 66   Ht 5\' 5"  (1.651 m)   Wt 200 lb (90.7 kg)  BMI 33.28 kg/m     Wt Readings from Last 3 Encounters:  08/16/17 200 lb (90.7 kg)  06/23/17 199 lb (90.3 kg)  06/20/17 203 lb (92.1 kg)     Physical Exam  Constitutional: She is oriented to person, place, and time. She appears well-developed and well-nourished. No distress.  HENT:  Head: Normocephalic and atraumatic.  Neck: Normal range of motion. Neck supple. No JVD present.  Cardiovascular: Normal rate, regular rhythm and normal heart sounds.  Exam reveals no gallop and no friction rub.   No murmur heard. Pulmonary/Chest: Effort normal and breath sounds normal. No respiratory distress. She has no wheezes. She has no rales. She exhibits no tenderness.  Abdominal: Soft. Bowel sounds are normal. There is no tenderness.  Musculoskeletal: Normal range of motion. She exhibits no edema or deformity.  Neurological: She is alert and oriented to person, place, and time.  Skin: Skin is warm and dry.  Psychiatric: She has a normal mood and affect. Her behavior is normal. Thought content normal.     ASSESSMENT:    1. Palpitations   2. Essential (primary) hypertension   3. Coronary artery disease involving native coronary artery of native heart without angina pectoris   4. Obstructive sleep apnea    PLAN:    In order of problems listed above:  1. Palpitations -48 hr holter monitor results  08/08/17: Sinus rhythm with periods of sinus bradycardia and sinus tachycardia Single PVC's and PAC's. No sustained arrhythmia or pathologic pauses.   -Patient relates these symptoms to stress, especially at work. She has made attempts at reducing triggers with elimination of caffeine and control of her stress. Her PVCs have decreased in frequency. -She is concerned about underlying cardiac issues and we discussed doing an echocardiogram and/or an exercise stress test, but she feels that since her symptoms have improved with the changes she's made she declines this testing at this time. No murmurs noted. -We also discussed the use of beta blocker if needed for increase in her symptoms. Her heart rate is in the 60s and blood pressure is on the low side so we will avoid this at present. -She does continue to smoke and is advised on cessation -She will work on continued stress management and explore how she can improve her work situation.  2. Hypertension -Blood pressure is well controlled on current regimen  3. Hx CAD with  acute anterior MI in 2013 with subtotal occlusion of a very small diagonal branch, normal LV function and presumed vasospasm -Continue current therapy with aspirin, amlodipine 5 mg, Lipitor 40 mg, Ace inhibitor. Her recent LDL was 50 on 06/21/17 -She will have her routine follow-up with Dr. Burt Knack approximately one year from her last appointment with him   3. OSA -Mild by sleep study 06/19/17 Ordered by pain management clinic. The patient is treating with improved sleep hygiene. This can contribute to the presence of PVC's, but is likely not severe enough.  Medication Adjustments/Labs and Tests Ordered: Current medicines are reviewed at length with the patient today.  Concerns regarding medicines are outlined above. Labs and tests ordered and medication changes are outlined in the patient instructions below:  Patient Instructions  Medication Instructions:  Your physician  recommends that you continue on your current medications as directed. Please refer to the Current Medication list given to you today.   Labwork: NONE ORDERED TODAY  Testing/Procedures: NONE ORDERED TODAY  Follow-Up: Your physician wants you to follow-up in: [place] DR. Burt Knack You  will receive a reminder letter in the mail two months in advance. If you don't receive a letter, please call our office to schedule the follow-up appointment.   Any Other Special Instructions Will Be Listed Below (If Applicable).     If you need a refill on your cardiac medications before your next appointment, please call your pharmacy.      Signed, Daune Perch, NP  08/16/2017 10:27 AM    [place]

## 2017-08-16 ENCOUNTER — Encounter: Payer: Self-pay | Admitting: Physician Assistant

## 2017-08-16 ENCOUNTER — Ambulatory Visit (INDEPENDENT_AMBULATORY_CARE_PROVIDER_SITE_OTHER): Payer: 59 | Admitting: Cardiology

## 2017-08-16 VITALS — BP 108/60 | HR 66 | Ht 65.0 in | Wt 200.0 lb

## 2017-08-16 DIAGNOSIS — R002 Palpitations: Secondary | ICD-10-CM

## 2017-08-16 DIAGNOSIS — I2510 Atherosclerotic heart disease of native coronary artery without angina pectoris: Secondary | ICD-10-CM | POA: Diagnosis not present

## 2017-08-16 DIAGNOSIS — G4733 Obstructive sleep apnea (adult) (pediatric): Secondary | ICD-10-CM

## 2017-08-16 DIAGNOSIS — I10 Essential (primary) hypertension: Secondary | ICD-10-CM

## 2017-08-16 NOTE — Patient Instructions (Signed)
Medication Instructions:  Your physician recommends that you continue on your current medications as directed. Please refer to the Current Medication list given to you today.   Labwork: NONE ORDERED TODAY  Testing/Procedures: NONE ORDERED TODAY  Follow-Up: Your physician wants you to follow-up in: 6 MONTHS WITH DR. Emelda Fear will receive a reminder letter in the mail two months in advance. If you don't receive a letter, please call our office to schedule the follow-up appointment.   Any Other Special Instructions Will Be Listed Below (If Applicable).     If you need a refill on your cardiac medications before your next appointment, please call your pharmacy.

## 2017-08-31 DIAGNOSIS — M47817 Spondylosis without myelopathy or radiculopathy, lumbosacral region: Secondary | ICD-10-CM | POA: Diagnosis not present

## 2017-08-31 DIAGNOSIS — G894 Chronic pain syndrome: Secondary | ICD-10-CM | POA: Diagnosis not present

## 2017-08-31 DIAGNOSIS — M542 Cervicalgia: Secondary | ICD-10-CM | POA: Diagnosis not present

## 2017-08-31 DIAGNOSIS — M2550 Pain in unspecified joint: Secondary | ICD-10-CM | POA: Diagnosis not present

## 2017-09-30 ENCOUNTER — Other Ambulatory Visit: Payer: Self-pay | Admitting: Cardiovascular Disease

## 2017-09-30 DIAGNOSIS — I2510 Atherosclerotic heart disease of native coronary artery without angina pectoris: Secondary | ICD-10-CM

## 2017-09-30 DIAGNOSIS — I10 Essential (primary) hypertension: Secondary | ICD-10-CM

## 2017-10-10 ENCOUNTER — Institutional Professional Consult (permissible substitution): Payer: 59 | Admitting: Internal Medicine

## 2017-10-31 DIAGNOSIS — G894 Chronic pain syndrome: Secondary | ICD-10-CM | POA: Diagnosis not present

## 2017-10-31 DIAGNOSIS — M2550 Pain in unspecified joint: Secondary | ICD-10-CM | POA: Diagnosis not present

## 2017-10-31 DIAGNOSIS — M542 Cervicalgia: Secondary | ICD-10-CM | POA: Diagnosis not present

## 2017-10-31 DIAGNOSIS — M47817 Spondylosis without myelopathy or radiculopathy, lumbosacral region: Secondary | ICD-10-CM | POA: Diagnosis not present

## 2017-11-17 ENCOUNTER — Other Ambulatory Visit: Payer: Self-pay | Admitting: Internal Medicine

## 2017-12-07 ENCOUNTER — Other Ambulatory Visit: Payer: Self-pay | Admitting: Internal Medicine

## 2017-12-13 ENCOUNTER — Other Ambulatory Visit: Payer: Self-pay | Admitting: Internal Medicine

## 2017-12-13 DIAGNOSIS — I10 Essential (primary) hypertension: Secondary | ICD-10-CM

## 2017-12-13 DIAGNOSIS — E785 Hyperlipidemia, unspecified: Secondary | ICD-10-CM

## 2017-12-13 DIAGNOSIS — R7302 Impaired glucose tolerance (oral): Secondary | ICD-10-CM

## 2017-12-20 ENCOUNTER — Other Ambulatory Visit: Payer: 59 | Admitting: Internal Medicine

## 2017-12-20 DIAGNOSIS — I10 Essential (primary) hypertension: Secondary | ICD-10-CM

## 2017-12-20 DIAGNOSIS — E785 Hyperlipidemia, unspecified: Secondary | ICD-10-CM

## 2017-12-20 DIAGNOSIS — R7302 Impaired glucose tolerance (oral): Secondary | ICD-10-CM

## 2017-12-21 LAB — LIPID PANEL
Cholesterol: 136 mg/dL (ref <200)
HDL: 52 mg/dL (ref >50)
LDL Cholesterol (Calc): 64 mg/dL (calc)
Non-HDL Cholesterol (Calc): 84 mg/dL (calc) (ref <130)
Total CHOL/HDL Ratio: 2.6 (calc) (ref <5.0)
Triglycerides: 114 mg/dL (ref <150)

## 2017-12-21 LAB — HEPATIC FUNCTION PANEL
AG Ratio: 1.9 (calc) (ref 1.0–2.5)
ALT: 21 U/L (ref 6–29)
AST: 22 U/L (ref 10–35)
Albumin: 4.2 g/dL (ref 3.6–5.1)
Alkaline phosphatase (APISO): 87 U/L (ref 33–130)
Bilirubin, Direct: 0.1 mg/dL (ref 0.0–0.2)
Globulin: 2.2 g/dL (calc) (ref 1.9–3.7)
Indirect Bilirubin: 0.3 mg/dL (calc) (ref 0.2–1.2)
Total Bilirubin: 0.4 mg/dL (ref 0.2–1.2)
Total Protein: 6.4 g/dL (ref 6.1–8.1)

## 2017-12-21 LAB — MICROALBUMIN / CREATININE URINE RATIO
Creatinine, Urine: 88 mg/dL (ref 20–275)
Microalb Creat Ratio: 28 mcg/mg creat (ref <30)
Microalb, Ur: 2.5 mg/dL

## 2017-12-21 LAB — BASIC METABOLIC PANEL
BUN: 10 mg/dL (ref 7–25)
CO2: 31 mmol/L (ref 20–32)
Calcium: 9.4 mg/dL (ref 8.6–10.4)
Chloride: 105 mmol/L (ref 98–110)
Creat: 0.83 mg/dL (ref 0.50–1.05)
Glucose, Bld: 96 mg/dL (ref 65–99)
Potassium: 3.9 mmol/L (ref 3.5–5.3)
Sodium: 143 mmol/L (ref 135–146)

## 2017-12-21 LAB — HEMOGLOBIN A1C
Hgb A1c MFr Bld: 5.7 % of total Hgb — ABNORMAL HIGH (ref <5.7)
Mean Plasma Glucose: 117 (calc)
eAG (mmol/L): 6.5 (calc)

## 2017-12-22 ENCOUNTER — Encounter: Payer: Self-pay | Admitting: Internal Medicine

## 2017-12-22 ENCOUNTER — Ambulatory Visit: Payer: 59 | Admitting: Internal Medicine

## 2017-12-22 VITALS — BP 120/80 | HR 78 | Wt 195.0 lb

## 2017-12-22 DIAGNOSIS — G4733 Obstructive sleep apnea (adult) (pediatric): Secondary | ICD-10-CM | POA: Diagnosis not present

## 2017-12-22 DIAGNOSIS — M5431 Sciatica, right side: Secondary | ICD-10-CM

## 2017-12-22 DIAGNOSIS — R7302 Impaired glucose tolerance (oral): Secondary | ICD-10-CM

## 2017-12-22 DIAGNOSIS — I2510 Atherosclerotic heart disease of native coronary artery without angina pectoris: Secondary | ICD-10-CM | POA: Diagnosis not present

## 2017-12-22 DIAGNOSIS — F439 Reaction to severe stress, unspecified: Secondary | ICD-10-CM

## 2017-12-22 DIAGNOSIS — I10 Essential (primary) hypertension: Secondary | ICD-10-CM

## 2017-12-22 DIAGNOSIS — I252 Old myocardial infarction: Secondary | ICD-10-CM

## 2017-12-22 DIAGNOSIS — Z87891 Personal history of nicotine dependence: Secondary | ICD-10-CM

## 2017-12-22 MED ORDER — ALPRAZOLAM 0.5 MG PO TABS: 0.5000 mg | ORAL_TABLET | Freq: Two times a day (BID) | ORAL | 1 refills | Status: DC | PRN

## 2017-12-22 MED ORDER — ENALAPRIL MALEATE 10 MG PO TABS
10.0000 mg | ORAL_TABLET | Freq: Every day | ORAL | 1 refills | Status: DC
Start: 2017-12-22 — End: 2018-09-04

## 2017-12-22 MED ORDER — LEVOTHYROXINE SODIUM 75 MCG PO TABS: 75.0000 ug | ORAL_TABLET | Freq: Every day | ORAL | 1 refills | Status: DC

## 2017-12-22 MED ORDER — PREDNISONE 10 MG PO TABS: ORAL_TABLET | ORAL | 0 refills | Status: DC

## 2017-12-22 NOTE — Progress Notes (Signed)
   Subjective:    Patient ID: [name], female    DOB: 1966/03/17, 52 y.o.   MRN: [id number]  HPI 52 year old Female for 6 month recheck. Has had right sided sciatica for at least one month. Sees chronic pain management Physician.  Says she was supposed to be seen by physical therapist and pain management physician was supposed to order that she says she has not her back.  Pain is in the right but does not really spread down her right leg.  She has no weakness or numbness in her right leg.  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  Is on her feet a lot but does not do a lot  but does not do a lot of heavy lifting.  She continues to smoke.  We had another conversation briefly about this today.  Unfortunately, her husband recently lost his job.  This has  been stressful.  Her recent lab work shows normal hemoglobin A1c, lipid panel and liver functions.  She would like a prescription for Xanax for anxiety surrounding situational stress and this was provided.  Continues to be followed by Dr. Burt Knack for coronary artery disease with no recurrence of chest pain.  Children are doing well.  Has noticed a faint paresthesia left arm at times.  It does not persist.  It may be perhaps positional as she notices it while working on the computer at work.     Review of Systems see above     Objective:   Physical Exam  Skin warm and dry.  Nodes none.  Neck is supple.  No carotid bruits.  Chest clear.  Cardiac exam regular rate and rhythm normal S1 and S2.  Extremities without edema.  Straight leg raising is negative ultrasound of the right.  Normal muscle strength in the lower extremities.      Assessment & Plan:  Right-sided sciatica  Coronary artery disease  History of smoking  Hyperlipidemia-stable on statin therapy  Situational stress  History of sleep apnea  Impaired glucose tolerance and hemoglobin A1c normal  Essential hypertension-stable on current  regimen  Plan: Take prednisone and tapering course for 12 days as directed.  Referral put in for physical therapy at [facility] on [facility].  Take Xanax sparingly for anxiety.

## 2017-12-22 NOTE — Patient Instructions (Addendum)
Take 12-day tapering course of prednisone as directed.  Order written for physical therapy for right sciatica.  I am pleased with lab results.  Continue same medications.  Please stop smoking.  Return in 6 months for physical examination.  Take Xanax sparingly for anxiety.

## 2018-01-10 ENCOUNTER — Other Ambulatory Visit: Payer: Self-pay | Admitting: Internal Medicine

## 2018-01-10 DIAGNOSIS — G894 Chronic pain syndrome: Secondary | ICD-10-CM | POA: Diagnosis not present

## 2018-01-10 DIAGNOSIS — Z1231 Encounter for screening mammogram for malignant neoplasm of breast: Secondary | ICD-10-CM

## 2018-01-10 DIAGNOSIS — M47817 Spondylosis without myelopathy or radiculopathy, lumbosacral region: Secondary | ICD-10-CM | POA: Diagnosis not present

## 2018-01-10 DIAGNOSIS — M542 Cervicalgia: Secondary | ICD-10-CM | POA: Diagnosis not present

## 2018-01-10 DIAGNOSIS — M2550 Pain in unspecified joint: Secondary | ICD-10-CM | POA: Diagnosis not present

## 2018-01-17 DIAGNOSIS — R293 Abnormal posture: Secondary | ICD-10-CM | POA: Diagnosis not present

## 2018-01-17 DIAGNOSIS — M2560 Stiffness of unspecified joint, not elsewhere classified: Secondary | ICD-10-CM | POA: Diagnosis not present

## 2018-01-17 DIAGNOSIS — M545 Low back pain: Secondary | ICD-10-CM | POA: Diagnosis not present

## 2018-01-17 DIAGNOSIS — M542 Cervicalgia: Secondary | ICD-10-CM | POA: Diagnosis not present

## 2018-01-18 DIAGNOSIS — M542 Cervicalgia: Secondary | ICD-10-CM | POA: Diagnosis not present

## 2018-01-18 DIAGNOSIS — M2560 Stiffness of unspecified joint, not elsewhere classified: Secondary | ICD-10-CM | POA: Diagnosis not present

## 2018-01-18 DIAGNOSIS — R293 Abnormal posture: Secondary | ICD-10-CM | POA: Diagnosis not present

## 2018-01-18 DIAGNOSIS — M545 Low back pain: Secondary | ICD-10-CM | POA: Diagnosis not present

## 2018-01-23 DIAGNOSIS — M542 Cervicalgia: Secondary | ICD-10-CM | POA: Diagnosis not present

## 2018-01-23 DIAGNOSIS — M2560 Stiffness of unspecified joint, not elsewhere classified: Secondary | ICD-10-CM | POA: Diagnosis not present

## 2018-01-23 DIAGNOSIS — R293 Abnormal posture: Secondary | ICD-10-CM | POA: Diagnosis not present

## 2018-01-23 DIAGNOSIS — M545 Low back pain: Secondary | ICD-10-CM | POA: Diagnosis not present

## 2018-01-27 DIAGNOSIS — R293 Abnormal posture: Secondary | ICD-10-CM | POA: Diagnosis not present

## 2018-01-27 DIAGNOSIS — M545 Low back pain: Secondary | ICD-10-CM | POA: Diagnosis not present

## 2018-01-27 DIAGNOSIS — M542 Cervicalgia: Secondary | ICD-10-CM | POA: Diagnosis not present

## 2018-01-27 DIAGNOSIS — M2560 Stiffness of unspecified joint, not elsewhere classified: Secondary | ICD-10-CM | POA: Diagnosis not present

## 2018-01-30 DIAGNOSIS — M542 Cervicalgia: Secondary | ICD-10-CM | POA: Diagnosis not present

## 2018-01-30 DIAGNOSIS — M2560 Stiffness of unspecified joint, not elsewhere classified: Secondary | ICD-10-CM | POA: Diagnosis not present

## 2018-01-30 DIAGNOSIS — R293 Abnormal posture: Secondary | ICD-10-CM | POA: Diagnosis not present

## 2018-01-30 DIAGNOSIS — M545 Low back pain: Secondary | ICD-10-CM | POA: Diagnosis not present

## 2018-02-01 DIAGNOSIS — M542 Cervicalgia: Secondary | ICD-10-CM | POA: Diagnosis not present

## 2018-02-01 DIAGNOSIS — M2560 Stiffness of unspecified joint, not elsewhere classified: Secondary | ICD-10-CM | POA: Diagnosis not present

## 2018-02-01 DIAGNOSIS — R293 Abnormal posture: Secondary | ICD-10-CM | POA: Diagnosis not present

## 2018-02-01 DIAGNOSIS — M545 Low back pain: Secondary | ICD-10-CM | POA: Diagnosis not present

## 2018-02-02 DIAGNOSIS — R293 Abnormal posture: Secondary | ICD-10-CM | POA: Diagnosis not present

## 2018-02-02 DIAGNOSIS — M2560 Stiffness of unspecified joint, not elsewhere classified: Secondary | ICD-10-CM | POA: Diagnosis not present

## 2018-02-02 DIAGNOSIS — M542 Cervicalgia: Secondary | ICD-10-CM | POA: Diagnosis not present

## 2018-02-02 DIAGNOSIS — M545 Low back pain: Secondary | ICD-10-CM | POA: Diagnosis not present

## 2018-02-07 DIAGNOSIS — L570 Actinic keratosis: Secondary | ICD-10-CM | POA: Diagnosis not present

## 2018-02-07 DIAGNOSIS — L820 Inflamed seborrheic keratosis: Secondary | ICD-10-CM | POA: Diagnosis not present

## 2018-02-07 DIAGNOSIS — L853 Xerosis cutis: Secondary | ICD-10-CM | POA: Diagnosis not present

## 2018-02-07 DIAGNOSIS — M2560 Stiffness of unspecified joint, not elsewhere classified: Secondary | ICD-10-CM | POA: Diagnosis not present

## 2018-02-07 DIAGNOSIS — R293 Abnormal posture: Secondary | ICD-10-CM | POA: Diagnosis not present

## 2018-02-07 DIAGNOSIS — M545 Low back pain: Secondary | ICD-10-CM | POA: Diagnosis not present

## 2018-02-07 DIAGNOSIS — M542 Cervicalgia: Secondary | ICD-10-CM | POA: Diagnosis not present

## 2018-02-14 DIAGNOSIS — M2560 Stiffness of unspecified joint, not elsewhere classified: Secondary | ICD-10-CM | POA: Diagnosis not present

## 2018-02-14 DIAGNOSIS — M545 Low back pain: Secondary | ICD-10-CM | POA: Diagnosis not present

## 2018-02-14 DIAGNOSIS — R293 Abnormal posture: Secondary | ICD-10-CM | POA: Diagnosis not present

## 2018-02-14 DIAGNOSIS — M542 Cervicalgia: Secondary | ICD-10-CM | POA: Diagnosis not present

## 2018-02-20 ENCOUNTER — Ambulatory Visit
Admission: RE | Admit: 2018-02-20 | Discharge: 2018-02-20 | Disposition: A | Discharge status: Home / Self Care | Payer: 59 | Source: Ambulatory Visit | Attending: Internal Medicine | Admitting: Internal Medicine

## 2018-02-20 DIAGNOSIS — Z1231 Encounter for screening mammogram for malignant neoplasm of breast: Secondary | ICD-10-CM | POA: Diagnosis not present

## 2018-02-21 DIAGNOSIS — M542 Cervicalgia: Secondary | ICD-10-CM | POA: Diagnosis not present

## 2018-02-21 DIAGNOSIS — R293 Abnormal posture: Secondary | ICD-10-CM | POA: Diagnosis not present

## 2018-02-21 DIAGNOSIS — M545 Low back pain: Secondary | ICD-10-CM | POA: Diagnosis not present

## 2018-02-21 DIAGNOSIS — M2560 Stiffness of unspecified joint, not elsewhere classified: Secondary | ICD-10-CM | POA: Diagnosis not present

## 2018-02-23 DIAGNOSIS — M542 Cervicalgia: Secondary | ICD-10-CM | POA: Diagnosis not present

## 2018-02-23 DIAGNOSIS — M545 Low back pain: Secondary | ICD-10-CM | POA: Diagnosis not present

## 2018-02-23 DIAGNOSIS — R293 Abnormal posture: Secondary | ICD-10-CM | POA: Diagnosis not present

## 2018-02-23 DIAGNOSIS — M2560 Stiffness of unspecified joint, not elsewhere classified: Secondary | ICD-10-CM | POA: Diagnosis not present

## 2018-02-28 DIAGNOSIS — M545 Low back pain: Secondary | ICD-10-CM | POA: Diagnosis not present

## 2018-02-28 DIAGNOSIS — M542 Cervicalgia: Secondary | ICD-10-CM | POA: Diagnosis not present

## 2018-02-28 DIAGNOSIS — M2560 Stiffness of unspecified joint, not elsewhere classified: Secondary | ICD-10-CM | POA: Diagnosis not present

## 2018-02-28 DIAGNOSIS — R293 Abnormal posture: Secondary | ICD-10-CM | POA: Diagnosis not present

## 2018-03-07 DIAGNOSIS — M542 Cervicalgia: Secondary | ICD-10-CM | POA: Diagnosis not present

## 2018-03-07 DIAGNOSIS — M47817 Spondylosis without myelopathy or radiculopathy, lumbosacral region: Secondary | ICD-10-CM | POA: Diagnosis not present

## 2018-03-07 DIAGNOSIS — Z79891 Long term (current) use of opiate analgesic: Secondary | ICD-10-CM | POA: Diagnosis not present

## 2018-03-07 DIAGNOSIS — G894 Chronic pain syndrome: Secondary | ICD-10-CM | POA: Diagnosis not present

## 2018-03-07 DIAGNOSIS — M2550 Pain in unspecified joint: Secondary | ICD-10-CM | POA: Diagnosis not present

## 2018-04-10 ENCOUNTER — Other Ambulatory Visit: Payer: Self-pay | Admitting: Cardiovascular Disease

## 2018-04-10 DIAGNOSIS — I2510 Atherosclerotic heart disease of native coronary artery without angina pectoris: Secondary | ICD-10-CM

## 2018-04-10 DIAGNOSIS — I10 Essential (primary) hypertension: Secondary | ICD-10-CM

## 2018-05-03 DIAGNOSIS — G894 Chronic pain syndrome: Secondary | ICD-10-CM | POA: Diagnosis not present

## 2018-05-03 DIAGNOSIS — M47817 Spondylosis without myelopathy or radiculopathy, lumbosacral region: Secondary | ICD-10-CM | POA: Diagnosis not present

## 2018-05-03 DIAGNOSIS — M542 Cervicalgia: Secondary | ICD-10-CM | POA: Diagnosis not present

## 2018-05-03 DIAGNOSIS — M2550 Pain in unspecified joint: Secondary | ICD-10-CM | POA: Diagnosis not present

## 2018-05-04 ENCOUNTER — Encounter: Payer: Self-pay | Admitting: Internal Medicine

## 2018-05-04 ENCOUNTER — Ambulatory Visit (INDEPENDENT_AMBULATORY_CARE_PROVIDER_SITE_OTHER): Payer: 59 | Admitting: Internal Medicine

## 2018-05-04 VITALS — BP 140/90 | HR 102 | Temp 102.3°F | Ht 65.0 in | Wt 198.0 lb

## 2018-05-04 DIAGNOSIS — R509 Fever, unspecified: Secondary | ICD-10-CM

## 2018-05-04 DIAGNOSIS — B349 Viral infection, unspecified: Secondary | ICD-10-CM | POA: Diagnosis not present

## 2018-05-04 DIAGNOSIS — J0100 Acute maxillary sinusitis, unspecified: Secondary | ICD-10-CM | POA: Diagnosis not present

## 2018-05-04 LAB — CBC WITH DIFFERENTIAL/PLATELET
Basophils Absolute: 29 {cells}/uL (ref 0–200)
Basophils Relative: 0.3 %
Eosinophils Absolute: 97 {cells}/uL (ref 15–500)
Eosinophils Relative: 1 %
HCT: 43.4 % (ref 35.0–45.0)
Hemoglobin: 15.1 g/dL (ref 11.7–15.5)
Lymphs Abs: 1174 {cells}/uL (ref 850–3900)
MCH: 31.7 pg (ref 27.0–33.0)
MCHC: 34.8 g/dL (ref 32.0–36.0)
MCV: 91 fL (ref 80.0–100.0)
MPV: 12 fL (ref 7.5–12.5)
Monocytes Relative: 7.8 %
Neutro Abs: 7644 {cells}/uL (ref 1500–7800)
Neutrophils Relative %: 78.8 %
Platelets: 173 Thousand/uL (ref 140–400)
RBC: 4.77 Million/uL (ref 3.80–5.10)
RDW: 11.6 % (ref 11.0–15.0)
Total Lymphocyte: 12.1 %
WBC mixed population: 757 {cells}/uL (ref 200–950)
WBC: 9.7 Thousand/uL (ref 3.8–10.8)

## 2018-05-04 MED ORDER — CEFTRIAXONE SODIUM 1 G IJ SOLR
1.0000 g | Freq: Once | INTRAMUSCULAR | Status: AC
  Administered 2018-05-04: 1 g via INTRAMUSCULAR

## 2018-05-04 MED ORDER — CLARITHROMYCIN 500 MG PO TABS: 500.0000 mg | ORAL_TABLET | Freq: Two times a day (BID) | ORAL | 0 refills | Status: DC

## 2018-05-04 NOTE — Patient Instructions (Addendum)
Rest and drink plenty of fluids.  Take Advil very sparingly to control fever.  Take hydrocodone APAP for headache and fever.  Biaxin 500 mg twice daily for 10 days.  Rocephin 1 g IM.  Note given to be out of work.  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

## 2018-05-04 NOTE — Progress Notes (Signed)
   Subjective:    Patient ID: [name], female    DOB: [birth year], 52 y.o.   MRN: [id number]  HPI 52 year old Female presents today with fever and headache.  Has had some nasal congestion.  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.  Had flu vaccine this past Fall.    Review of Systems no cough.  Malaise and fatigue present.  Right TM uncomfortable     Objective:   Physical Exam Sounds nasally congested.  Right TM is injected but not red.  Left TM dull but not red.  Pharynx is slightly injected.  Neck is supple.  Chest clear to auscultation.  No rales or wheezing.  She looks a bit pale and is sluggish.       Assessment & Plan:  Acute viral syndrome  Probable acute sinusitis  Plan: Rocephin 1 g IM.  CBC with differential drawn and pending.  Biaxin 500 mg twice daily for 10 days.  Rest and drink plenty of fluids.  May take Advil very sparingly along with hydrocodone APAP for headache and fever control.  Out of work note given.  Return after being afebrile for 24 hours and feeling better.

## 2018-05-09 ENCOUNTER — Telehealth: Payer: Self-pay | Admitting: Internal Medicine

## 2018-05-09 NOTE — Telephone Encounter (Signed)
She likely has bronchospasm from respiratory viral syndrome. Does she have inhaler?

## 2018-05-09 NOTE — Telephone Encounter (Signed)
Patient states that she has started wheezing now.  She is off today, but says that she has been sleeping most of the day.  States that if she walks to the mailbox, for example, she is short of breath.  Didn't know if you thought something should be changed or not.  She is just wondering why she is SOB and wheezing now.  She wanted to get the message to you.  She just isn't feeling too well today.    Phone:  [phone] or [phone]  Thank you.

## 2018-05-10 ENCOUNTER — Other Ambulatory Visit: Payer: Self-pay

## 2018-05-10 MED ORDER — PREDNISONE 10 MG PO TABS: ORAL_TABLET | ORAL | 0 refills | Status: DC

## 2018-05-10 NOTE — Telephone Encounter (Signed)
Patient called states she does have an albuterol inhaler at home she is using it QID it helps some but she has episodes of coughing and wheezing and she is SOB with exertion.

## 2018-05-11 ENCOUNTER — Ambulatory Visit (INDEPENDENT_AMBULATORY_CARE_PROVIDER_SITE_OTHER): Payer: 59 | Admitting: Internal Medicine

## 2018-05-11 ENCOUNTER — Encounter: Payer: Self-pay | Admitting: Internal Medicine

## 2018-05-11 ENCOUNTER — Ambulatory Visit
Admission: RE | Admit: 2018-05-11 | Discharge: 2018-05-11 | Disposition: A | Discharge status: Home / Self Care | Payer: 59 | Source: Ambulatory Visit | Attending: Internal Medicine | Admitting: Internal Medicine

## 2018-05-11 VITALS — BP 138/80 | HR 106 | Temp 98.1°F | Resp 19 | Ht 65.0 in | Wt 198.0 lb

## 2018-05-11 DIAGNOSIS — R062 Wheezing: Secondary | ICD-10-CM | POA: Diagnosis not present

## 2018-05-11 DIAGNOSIS — R0602 Shortness of breath: Secondary | ICD-10-CM | POA: Diagnosis not present

## 2018-05-11 DIAGNOSIS — J9801 Acute bronchospasm: Secondary | ICD-10-CM

## 2018-05-11 DIAGNOSIS — R059 Cough, unspecified: Secondary | ICD-10-CM

## 2018-05-11 DIAGNOSIS — R05 Cough: Secondary | ICD-10-CM

## 2018-05-11 MED ORDER — CEFTRIAXONE SODIUM 1 G IJ SOLR
1.0000 g | Freq: Once | INTRAMUSCULAR | Status: AC
  Administered 2018-05-11: 1 g via INTRAMUSCULAR

## 2018-05-11 MED ORDER — BUDESONIDE-FORMOTEROL FUMARATE 160-4.5 MCG/ACT IN AERO: 2.0000 | INHALATION_SPRAY | Freq: Two times a day (BID) | RESPIRATORY_TRACT | 3 refills | Status: DC

## 2018-05-11 NOTE — Patient Instructions (Addendum)
Fingers tapering course of prednisone as prescribed yesterday.  Symbicort inhaler added to albuterol inhaler.  Finish course of Biaxin.  If not improving, will need to see Dr. Lake Bells.  Chest x-ray is negative

## 2018-05-11 NOTE — Progress Notes (Signed)
   Subjective:    Patient ID: [name], female    DOB: 07/12/1966, 52 y.o.   MRN: [id number]  HPI 52 year old Female called complaining of wheezing.  She has an inhaler.  It was not helping.  Yesterday we called prednisone and for her.  She is still wheezing today.  We sent her for chest x-ray which was negative for pneumonia.  We gave her a nebulizer treatment in the office.  She was seen May 30 and treated for acute maxillary sinusitis with Biaxin.  She continues to smoke.  We have had many discussions about smoking cessation.  She did not rest over the weekend as advised.  She was working.    Review of Systems     Objective:   Physical Exam Skin warm and dry.  Nodes none.  Chest reveals bilateral inspiratory wheezing.  Chest x-ray is negative.       Assessment & Plan:  Acute bronchospasm  History of smoking  Sinusitis  Plan: She is to finish prescribed course of Biaxin.  Was given 1 g IM Rocephin.  Started yesterday on tapering course of prednisone.  Add Symbicort inhaler 160/4.5 to albuterol inhaler.  Chest x-ray is negative for pneumonia.    If she is not improving she will need to see pulmonologist.  Has seen Dr. Lake Bells in 2018

## 2018-05-11 NOTE — Telephone Encounter (Signed)
I told [name] yesterday to have her come at 9:30 this am and to call in Prednisone for her yesterday afternoon

## 2018-05-15 ENCOUNTER — Other Ambulatory Visit: Payer: Self-pay | Admitting: Pulmonary Disease

## 2018-05-29 ENCOUNTER — Encounter: Payer: Self-pay | Admitting: Cardiovascular Disease

## 2018-05-29 ENCOUNTER — Ambulatory Visit: Payer: 59 | Admitting: Cardiovascular Disease

## 2018-05-29 VITALS — BP 100/68 | HR 86 | Ht 65.0 in | Wt 198.5 lb

## 2018-05-29 DIAGNOSIS — I10 Essential (primary) hypertension: Secondary | ICD-10-CM

## 2018-05-29 DIAGNOSIS — I25119 Atherosclerotic heart disease of native coronary artery with unspecified angina pectoris: Secondary | ICD-10-CM | POA: Diagnosis not present

## 2018-05-29 DIAGNOSIS — E782 Mixed hyperlipidemia: Secondary | ICD-10-CM

## 2018-05-29 NOTE — Patient Instructions (Signed)

## 2018-05-29 NOTE — Progress Notes (Signed)
Cardiology Office Note Date:  05/29/2018   ID:  Amy, Lam March 12, 1966, MRN [id number]  PCP:  Elby Showers, MD  Cardiologist:  Sherren Mocha, MD    Chief Complaint  Patient presents with  . Follow-up    CAD     History of Present Illness: [name] is a 52 y.o. female who presents for follow-up of coronary artery disease.  The patient initially presented in 2013 with an acute anterior wall MI.  Cardiac catheterization demonstrated subtotal occlusion of a very small diagonal branch with patency of the LAD and no other significant CAD.  LV function was within normal limits.  There was suspicion for a component of vasospasm.  She was treated medically and has had no recurrent ischemic events.  The patient is here alone today.  She is doing well.  She denies chest pain, shortness of breath, or heart palpitations.  She has a past history of palpitations at times of high stress.   Past Medical History:  Diagnosis Date  . ADHD (attention deficit hyperactivity disorder)   . Anxiety   . Asthma   . CAD (coronary artery disease)    a. 11/2012 Acute Anterior STEMI/Cath: Nonobs dzs except subtotal occlusion of small, 59mm D2, EF 55-65%;  b. 11/2012  Echo: EF 60-65%, nl wall motion, Gr 2 DD.  Marland Kitchen Chronic kidney disease    in setting of HSP 2006  . Coronary vasospasm (HCC)    a. presumed  . Depression   . GERD (gastroesophageal reflux disease)   . 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 Hypertension    Controlled - she states she is on BP meds for HSP  . Hypothyroidism   . Migraines   . Sleep apnea    "lost 50#; no problems w/it now" (11/22/2012)  . 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 Tobacco abuse     Past Surgical History:    Procedure Laterality Date  . APPENDECTOMY  1970's  . CARDIAC CATHETERIZATION  11/22/2012   "first one" (11/22/2012)  . [place]; 1996; 1999  . DILATION AND CURETTAGE OF UTERUS  1990's  . LEFT HEART CATHETERIZATION WITH CORONARY ANGIOGRAM N/A 11/22/2012   Procedure: LEFT HEART CATHETERIZATION WITH CORONARY ANGIOGRAM;  Surgeon: Sherren Mocha, MD;  Location: [facility] CATH LAB;  Service: Cardiovascular;  Laterality: N/A;    Current Outpatient Medications  Medication Sig Dispense Refill  . ALPRAZolam (XANAX) 0.5 MG tablet Take 1 tablet (0.5 mg total) by mouth 2 (two) times daily as needed for anxiety. 60 tablet 1  . amLODipine (NORVASC) 5 MG tablet TAKE 1 TABLET (5 MG TOTAL) BY MOUTH DAILY. 90 tablet 3  . aspirin EC 81 MG EC tablet Take 1 tablet (81 mg total) by mouth daily.    Marland Kitchen atorvastatin (LIPITOR) 40 MG tablet TAKE 1 TABLET BY MOUTH ONCE DAILY AT 6 PM 90 tablet 0  . budesonide-formoterol (SYMBICORT) 160-4.5 MCG/ACT inhaler Inhale 2 puffs into the lungs 2 (two) times daily. 1 Inhaler 3  . calcium-vitamin D (OSCAL WITH D) 500-200 MG-UNIT per tablet Take 1 tablet by mouth daily.    . Cholecalciferol (VITAMIN D3) 2000 units TABS Take 2,000 Units by mouth daily.    . clarithromycin (BIAXIN) 500  MG tablet Take 500 mg by mouth 2 (two) times daily.    Marland Kitchen desvenlafaxine (PRISTIQ) 50 MG 24 hr tablet TAKE 1 TABLET BY MOUTH ONCE DAILY 90 tablet 3  . enalapril (VASOTEC) 10 MG tablet Take 1 tablet (10 mg total) by mouth daily. 90 tablet 1  . HYDROcodone-acetaminophen (NORCO/VICODIN) 5-325 MG tablet Take 1 tablet by mouth every 6 (six) hours as needed for moderate pain. 60 tablet 0  . levothyroxine (SYNTHROID, LEVOTHROID) 75 MCG tablet Take 1 tablet (75 mcg total) by mouth daily. 90 tablet 1  . nitroGLYCERIN (NITROSTAT) 0.4 MG SL tablet Place 1 tablet (0.4 mg total) under the tongue every 5 (five) minutes as needed for chest pain. 25 tablet 3  . pantoprazole (PROTONIX) 40 MG tablet TAKE 1 TABLET  BY MOUTH 2 TIMES DAILY BEFORE A MEAL. 180 tablet 3  . VENTOLIN HFA 108 (90 Base) MCG/ACT inhaler INHALE 2 PUFFS INTO THE LUNGS EVERY 6 (SIX) HOURS AS NEEDED FOR WHEEZING OR SHORTNESS OF BREATH. 18 g 5   No current facility-administered medications for this visit.     Allergies:   Levaquin [levofloxacin] and Nsaids   Social History:  The patient  reports that she has been smoking cigarettes.  She has a 12.50 pack-year smoking history. She has never used smokeless tobacco. She reports that she does not drink alcohol or use drugs.   Family History:  The patient's family history includes Hypertension in her mother; Stroke in her father.    ROS:  Please see the history of present illness.  All other systems are reviewed and negative.    PHYSICAL EXAM: VS:  BP 100/68   Pulse 86   Ht 5\' 5"  (1.651 m)   Wt 198 lb 8 oz (90 kg)   SpO2 98%   BMI 33.03 kg/m  , BMI Body mass index is 33.03 kg/m. GEN: Well nourished, well developed, in no acute distress  HEENT: normal  Neck: no JVD, no masses. No carotid bruits Cardiac: RRR without murmur or gallop      Respiratory:  clear to auscultation bilaterally, normal work of breathing GI: soft, nontender, nondistended, + BS MS: no deformity or atrophy  Ext: no pretibial edema, pedal pulses 2+= bilaterally Skin: warm and dry, no rash Neuro:  Strength and sensation are intact Psych: euthymic mood, full affect  EKG:  EKG is not ordered today.  Recent Labs: 06/21/2017: TSH 1.08 12/20/2017: ALT 21; BUN 10; Creat 0.83; Potassium 3.9; Sodium 143 05/04/2018: Hemoglobin 15.1; Platelets 173   Lipid Panel     Component Value Date/Time   CHOL 136 12/20/2017 0924   TRIG 114 12/20/2017 0924   HDL 52 12/20/2017 0924   CHOLHDL 2.6 12/20/2017 0924   VLDL 23 06/21/2017 1014   LDLCALC 64 12/20/2017 0924      Wt Readings from Last 3 Encounters:  05/29/18 198 lb 8 oz (90 kg)  05/11/18 198 lb (89.8 kg)  05/04/18 198 lb (89.8 kg)    ASSESSMENT AND  PLAN: 1.  Coronary artery disease, native vessel, with angina: The patient is stable at present.  She is had no recurrence of chest pain on her current medical program which includes aspirin, a high intensity statin drug, and amlodipine.  2.  Hypertension: Blood pressure is well controlled today on amlodipine and enalapril.  3.  Hyperlipidemia: Lipids are at goal with an LDL cholesterol of 64 mg/dL on amlodipine 40 mg daily.  4.  Tobacco abuse: Cessation counseling done.  She is  not ready to quit.  Current medicines are reviewed with the patient today.  The patient does not have concerns regarding medicines.  Labs/ tests ordered today include:  No orders of the defined types were placed in this encounter.   Disposition:   FU one year  Signed, Sherren Mocha, MD  05/29/2018 1:23 PM    [facility] [place] [address], [place], [place]  [address] Phone: [phone]; Fax: [phone]

## 2018-06-14 ENCOUNTER — Other Ambulatory Visit: Payer: Self-pay | Admitting: Internal Medicine

## 2018-06-14 DIAGNOSIS — G8929 Other chronic pain: Secondary | ICD-10-CM

## 2018-06-14 DIAGNOSIS — E8881 Metabolic syndrome: Secondary | ICD-10-CM

## 2018-06-14 DIAGNOSIS — Z8679 Personal history of other diseases of the circulatory system: Secondary | ICD-10-CM

## 2018-06-14 DIAGNOSIS — I252 Old myocardial infarction: Secondary | ICD-10-CM

## 2018-06-14 DIAGNOSIS — K219 Gastro-esophageal reflux disease without esophagitis: Secondary | ICD-10-CM

## 2018-06-14 DIAGNOSIS — G4733 Obstructive sleep apnea (adult) (pediatric): Secondary | ICD-10-CM

## 2018-06-14 DIAGNOSIS — R7302 Impaired glucose tolerance (oral): Secondary | ICD-10-CM

## 2018-06-14 DIAGNOSIS — M7918 Myalgia, other site: Secondary | ICD-10-CM

## 2018-06-14 DIAGNOSIS — F32A Depression, unspecified: Secondary | ICD-10-CM

## 2018-06-14 DIAGNOSIS — F329 Major depressive disorder, single episode, unspecified: Secondary | ICD-10-CM

## 2018-06-14 DIAGNOSIS — F419 Anxiety disorder, unspecified: Secondary | ICD-10-CM

## 2018-06-14 DIAGNOSIS — E7849 Other hyperlipidemia: Secondary | ICD-10-CM

## 2018-06-14 DIAGNOSIS — Z87898 Personal history of other specified conditions: Secondary | ICD-10-CM

## 2018-06-14 DIAGNOSIS — Z0000 Encounter for general adult medical examination without abnormal findings: Secondary | ICD-10-CM

## 2018-06-14 DIAGNOSIS — F439 Reaction to severe stress, unspecified: Secondary | ICD-10-CM

## 2018-06-20 ENCOUNTER — Other Ambulatory Visit: Payer: 59 | Admitting: Internal Medicine

## 2018-06-20 DIAGNOSIS — Z87898 Personal history of other specified conditions: Secondary | ICD-10-CM

## 2018-06-20 DIAGNOSIS — G4733 Obstructive sleep apnea (adult) (pediatric): Secondary | ICD-10-CM

## 2018-06-20 DIAGNOSIS — R7302 Impaired glucose tolerance (oral): Secondary | ICD-10-CM

## 2018-06-20 DIAGNOSIS — E8881 Metabolic syndrome: Secondary | ICD-10-CM | POA: Diagnosis not present

## 2018-06-20 DIAGNOSIS — E7849 Other hyperlipidemia: Secondary | ICD-10-CM

## 2018-06-20 DIAGNOSIS — G8929 Other chronic pain: Secondary | ICD-10-CM

## 2018-06-20 DIAGNOSIS — K219 Gastro-esophageal reflux disease without esophagitis: Secondary | ICD-10-CM

## 2018-06-20 DIAGNOSIS — Z0000 Encounter for general adult medical examination without abnormal findings: Secondary | ICD-10-CM

## 2018-06-20 DIAGNOSIS — F419 Anxiety disorder, unspecified: Secondary | ICD-10-CM | POA: Diagnosis not present

## 2018-06-20 DIAGNOSIS — M7918 Myalgia, other site: Secondary | ICD-10-CM | POA: Diagnosis not present

## 2018-06-20 DIAGNOSIS — F439 Reaction to severe stress, unspecified: Secondary | ICD-10-CM | POA: Diagnosis not present

## 2018-06-20 DIAGNOSIS — F329 Major depressive disorder, single episode, unspecified: Secondary | ICD-10-CM | POA: Diagnosis not present

## 2018-06-20 DIAGNOSIS — Z8679 Personal history of other diseases of the circulatory system: Secondary | ICD-10-CM

## 2018-06-20 DIAGNOSIS — I252 Old myocardial infarction: Secondary | ICD-10-CM

## 2018-06-20 DIAGNOSIS — F32A Depression, unspecified: Secondary | ICD-10-CM

## 2018-06-21 LAB — COMPLETE METABOLIC PANEL WITH GFR
AG Ratio: 1.9 (calc) (ref 1.0–2.5)
ALT: 23 U/L (ref 6–29)
AST: 26 U/L (ref 10–35)
Albumin: 4.2 g/dL (ref 3.6–5.1)
Alkaline phosphatase (APISO): 102 U/L (ref 33–130)
BUN: 7 mg/dL (ref 7–25)
CO2: 30 mmol/L (ref 20–32)
Calcium: 9.6 mg/dL (ref 8.6–10.4)
Chloride: 104 mmol/L (ref 98–110)
Creat: 0.83 mg/dL (ref 0.50–1.05)
GFR, Est African American: 93 mL/min/{1.73_m2} (ref >=60)
GFR, Est Non African American: 81 mL/min/{1.73_m2} (ref >=60)
Globulin: 2.2 g/dL (calc) (ref 1.9–3.7)
Glucose, Bld: 111 mg/dL — ABNORMAL HIGH (ref 65–99)
Potassium: 4.6 mmol/L (ref 3.5–5.3)
Sodium: 141 mmol/L (ref 135–146)
Total Bilirubin: 0.6 mg/dL (ref 0.2–1.2)
Total Protein: 6.4 g/dL (ref 6.1–8.1)

## 2018-06-21 LAB — TSH: TSH: 1.43 mIU/L

## 2018-06-21 LAB — MICROALBUMIN / CREATININE URINE RATIO
Creatinine, Urine: 63 mg/dL (ref 20–275)
Microalb Creat Ratio: 32 mcg/mg creat — ABNORMAL HIGH (ref <30)
Microalb, Ur: 2 mg/dL

## 2018-06-21 LAB — CBC WITH DIFFERENTIAL/PLATELET
Basophils Absolute: 39 cells/uL (ref 0–200)
Basophils Relative: 0.5 %
Eosinophils Absolute: 203 cells/uL (ref 15–500)
Eosinophils Relative: 2.6 %
HCT: 46.8 % — ABNORMAL HIGH (ref 35.0–45.0)
Hemoglobin: 15.7 g/dL — ABNORMAL HIGH (ref 11.7–15.5)
Lymphs Abs: 2270 cells/uL (ref 850–3900)
MCH: 30.7 pg (ref 27.0–33.0)
MCHC: 33.5 g/dL (ref 32.0–36.0)
MCV: 91.6 fL (ref 80.0–100.0)
MPV: 11.5 fL (ref 7.5–12.5)
Monocytes Relative: 6.8 %
Neutro Abs: 4758 cells/uL (ref 1500–7800)
Neutrophils Relative %: 61 %
Platelets: 210 10*3/uL (ref 140–400)
RBC: 5.11 10*6/uL — ABNORMAL HIGH (ref 3.80–5.10)
RDW: 11.8 % (ref 11.0–15.0)
Total Lymphocyte: 29.1 %
WBC mixed population: 530 cells/uL (ref 200–950)
WBC: 7.8 10*3/uL (ref 3.8–10.8)

## 2018-06-21 LAB — LIPID PANEL
Cholesterol: 140 mg/dL (ref <200)
HDL: 55 mg/dL (ref >50)
LDL Cholesterol (Calc): 64 mg/dL (calc)
Non-HDL Cholesterol (Calc): 85 mg/dL (calc) (ref <130)
Total CHOL/HDL Ratio: 2.5 (calc) (ref <5.0)
Triglycerides: 128 mg/dL (ref <150)

## 2018-06-21 LAB — HEMOGLOBIN A1C
Hgb A1c MFr Bld: 5.8 % of total Hgb — ABNORMAL HIGH (ref <5.7)
Mean Plasma Glucose: 120 (calc)
eAG (mmol/L): 6.6 (calc)

## 2018-06-21 LAB — VITAMIN D 25 HYDROXY (VIT D DEFICIENCY, FRACTURES): Vit D, 25-Hydroxy: 37 ng/mL (ref 30–100)

## 2018-06-23 ENCOUNTER — Other Ambulatory Visit: Payer: 59 | Admitting: Internal Medicine

## 2018-06-26 ENCOUNTER — Encounter: Payer: Self-pay | Admitting: Internal Medicine

## 2018-06-26 ENCOUNTER — Ambulatory Visit (INDEPENDENT_AMBULATORY_CARE_PROVIDER_SITE_OTHER): Payer: 59 | Admitting: Internal Medicine

## 2018-06-26 VITALS — BP 102/80 | HR 80 | Ht 65.0 in | Wt 199.0 lb

## 2018-06-26 DIAGNOSIS — I252 Old myocardial infarction: Secondary | ICD-10-CM | POA: Diagnosis not present

## 2018-06-26 DIAGNOSIS — R7302 Impaired glucose tolerance (oral): Secondary | ICD-10-CM | POA: Diagnosis not present

## 2018-06-26 DIAGNOSIS — I10 Essential (primary) hypertension: Secondary | ICD-10-CM

## 2018-06-26 DIAGNOSIS — G4733 Obstructive sleep apnea (adult) (pediatric): Secondary | ICD-10-CM

## 2018-06-26 DIAGNOSIS — Z0000 Encounter for general adult medical examination without abnormal findings: Secondary | ICD-10-CM

## 2018-06-26 DIAGNOSIS — E039 Hypothyroidism, unspecified: Secondary | ICD-10-CM

## 2018-06-26 DIAGNOSIS — J22 Unspecified acute lower respiratory infection: Secondary | ICD-10-CM | POA: Diagnosis not present

## 2018-06-26 DIAGNOSIS — F17200 Nicotine dependence, unspecified, uncomplicated: Secondary | ICD-10-CM | POA: Diagnosis not present

## 2018-06-26 DIAGNOSIS — K219 Gastro-esophageal reflux disease without esophagitis: Secondary | ICD-10-CM

## 2018-06-26 DIAGNOSIS — F419 Anxiety disorder, unspecified: Secondary | ICD-10-CM

## 2018-06-26 DIAGNOSIS — E8881 Metabolic syndrome: Secondary | ICD-10-CM | POA: Diagnosis not present

## 2018-06-26 DIAGNOSIS — Z8679 Personal history of other diseases of the circulatory system: Secondary | ICD-10-CM | POA: Diagnosis not present

## 2018-06-26 DIAGNOSIS — F32A Depression, unspecified: Secondary | ICD-10-CM

## 2018-06-26 DIAGNOSIS — F329 Major depressive disorder, single episode, unspecified: Secondary | ICD-10-CM

## 2018-06-26 LAB — POCT URINALYSIS DIPSTICK
Appearance: NORMAL
Bilirubin, UA: NEGATIVE
Blood, UA: NEGATIVE
Glucose, UA: NEGATIVE
Ketones, UA: NEGATIVE
Leukocytes, UA: NEGATIVE
Nitrite, UA: NEGATIVE
Odor: NORMAL
Protein, UA: NEGATIVE
Spec Grav, UA: 1.01 (ref 1.010–1.025)
Urobilinogen, UA: 0.2 E.U./dL
pH, UA: 7.5 (ref 5.0–8.0)

## 2018-06-26 NOTE — Progress Notes (Signed)
Subjective:    Patient ID: [name], female    DOB: [birth year], 52 y.o.   MRN: [id number]  HPI 52 year old Female for health maintenance exam and evaluation of medical problems.  Continues to smoke.  Has not been able to quit.  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  She had subtotal occlusion of a very small branch of the diagonal and no other significant disease.  She underwent cardiac catheterization and did well.  She had a very serious case of at bedtime peripheral October 2006.  Hemoglobin dropped to 9.4 g.  She developed purpuric lesions on her lower extremities and colored stools.  CT scan showed diffuse colitis and she developed ascites.  Was transferred to [facility] after developing tea colored urine with hematuria.  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  She was treated with high-dose steroids which she remained on for several months.  She was treated by Dr. Marlana Salvage at [facility] in the rheumatology department.  She has never had a recurrence.  In September 2009 she was hospitalized for gastroenteritis secondary to C. difficile infection.  In 2009 she had a normal cardiac nuclear study.  In 2006 she had 2D echocardiogram that was normal.  History of sleep apnea evaluated by Dr. Annamaria Boots.  He has mild obstructive sleep apnea with moderate snoring.  Weight loss recommended.  Trial of CPAP suggested or at least supplemental oxygen.  History of migraine headaches.  History of cervical radiculopathy 2012.  History of osteophyte causing mild foraminal stenosis and impingement on MRI which is been evaluated by Dr. Vertell Limber.  She was maintained for while on Adderall for attention deficit disorder but this was discontinued after her MI.  She has chronic fibromyalgia type pain and is seen at [facility] pain management.  History of endometrial polyp causing menorrhagia removed by Dr. Sander Radon November 20 3.  History of 3 C-sections, 1995, 1996,  1999.  Appendectomy in the early 1970s.  No history of fractures.  Social history: Married with 3 sons.  Does not consume alcohol.  Is smoked for well over 20 years.  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  Family history: Mother passed away from an acute respiratory arrest.  Both parents with history of hypertension.  Father with history of stroke.  2 brothers in good health.  No sisters.    Review of Systems  Respiratory: Positive for cough.        History of sleep apnea and snoring.  Has cough and congestion.  Cardiovascular: Negative for chest pain.  Gastrointestinal: Negative.   Musculoskeletal: Positive for arthralgias.  Psychiatric/Behavioral:       Anxiety       Objective:   Physical Exam  Constitutional: She appears well-developed and well-nourished.  HENT:  Head: Normocephalic and atraumatic.  Right Ear: External ear normal.  Left Ear: External ear normal.  Mouth/Throat: Oropharynx is clear and moist.  Eyes: Pupils are equal, round, and reactive to light. Conjunctivae are normal. Right eye exhibits no discharge. Left eye exhibits no discharge.  Neck: No JVD present. No thyromegaly present.  Cardiovascular: Normal rate, regular rhythm and normal heart sounds.  No murmur heard. Pulmonary/Chest: Effort normal. No stridor. No respiratory distress. She has no wheezes. She has no rales.  Breasts normal female  Abdominal: Soft. She exhibits no distension and no mass. There is no tenderness. There is no rebound and no guarding. No hernia.  Genitourinary:  Genitourinary Comments: Deferred to GYN  Musculoskeletal: She exhibits no edema.  Lymphadenopathy:    She has no cervical adenopathy.  Neurological: She is alert. She displays normal reflexes. No cranial nerve deficit or sensory deficit. She exhibits normal muscle tone. Coordination normal.  Skin: Skin is warm and dry. No erythema.  Psychiatric: She has a normal mood and affect. Her behavior is normal.  Judgment and thought content normal.          Assessment & Plan:  Acute lower respiratory infection treated with 10-day course of Biaxin today  History of coronary artery disease status post anterior MI December 2013 followed by Dr. Burt Knack.  Maintained on statin.  Essential hypertension stable on current regimen  Hypothyroidism stable on thyroid replacement with normal TSH  Obesity-needs to be motivated to lose weight.  Dr. Migd[facility] D[facility] clinic suggested  Mild sleep apnea  History of smoking-once again discussed cessation  GE reflux stable with PPI  Musculoskeletal pain treated by pain management clinic  Anxiety depression treated with SSRI and PRN Xanax  History of HS purpura in the remote past  History of COPD and reactive airways disease-has inhalers and  Impaired glucose tolerance-hemoglobin A1c stable at 5.8%    Plan: Take Biaxin for 10 days as prescribed.  Recommend diet exercise and weight loss and quitting smoking.  Follow-up in 6 months.

## 2018-06-27 DIAGNOSIS — M47817 Spondylosis without myelopathy or radiculopathy, lumbosacral region: Secondary | ICD-10-CM | POA: Diagnosis not present

## 2018-06-27 DIAGNOSIS — M2550 Pain in unspecified joint: Secondary | ICD-10-CM | POA: Diagnosis not present

## 2018-06-27 DIAGNOSIS — M542 Cervicalgia: Secondary | ICD-10-CM | POA: Diagnosis not present

## 2018-06-27 DIAGNOSIS — G894 Chronic pain syndrome: Secondary | ICD-10-CM | POA: Diagnosis not present

## 2018-07-03 DIAGNOSIS — L570 Actinic keratosis: Secondary | ICD-10-CM | POA: Diagnosis not present

## 2018-07-03 DIAGNOSIS — D225 Melanocytic nevi of trunk: Secondary | ICD-10-CM | POA: Diagnosis not present

## 2018-07-03 DIAGNOSIS — D1801 Hemangioma of skin and subcutaneous tissue: Secondary | ICD-10-CM | POA: Diagnosis not present

## 2018-07-03 DIAGNOSIS — L821 Other seborrheic keratosis: Secondary | ICD-10-CM | POA: Diagnosis not present

## 2018-07-03 DIAGNOSIS — L738 Other specified follicular disorders: Secondary | ICD-10-CM | POA: Diagnosis not present

## 2018-07-03 DIAGNOSIS — D2261 Melanocytic nevi of right upper limb, including shoulder: Secondary | ICD-10-CM | POA: Diagnosis not present

## 2018-07-05 DIAGNOSIS — H524 Presbyopia: Secondary | ICD-10-CM | POA: Diagnosis not present

## 2018-07-05 DIAGNOSIS — H5213 Myopia, bilateral: Secondary | ICD-10-CM | POA: Diagnosis not present

## 2018-07-05 NOTE — Patient Instructions (Signed)
It was a pleasure to see you today.  Please consider Dr. Migdalia Dk clinic for weight loss.  Continue same medications.  Please quit smoking.  Follow-up in 6 months.  Take Biaxin for respiratory infection.

## 2018-08-02 ENCOUNTER — Other Ambulatory Visit: Payer: Self-pay | Admitting: Cardiovascular Disease

## 2018-08-02 DIAGNOSIS — I2510 Atherosclerotic heart disease of native coronary artery without angina pectoris: Secondary | ICD-10-CM

## 2018-08-02 DIAGNOSIS — I10 Essential (primary) hypertension: Secondary | ICD-10-CM

## 2018-08-03 DIAGNOSIS — Z6833 Body mass index (BMI) 33.0-33.9, adult: Secondary | ICD-10-CM | POA: Diagnosis not present

## 2018-08-03 DIAGNOSIS — Z01419 Encounter for gynecological examination (general) (routine) without abnormal findings: Secondary | ICD-10-CM | POA: Diagnosis not present

## 2018-08-09 ENCOUNTER — Other Ambulatory Visit: Payer: Self-pay | Admitting: Internal Medicine

## 2018-08-28 DIAGNOSIS — M47817 Spondylosis without myelopathy or radiculopathy, lumbosacral region: Secondary | ICD-10-CM | POA: Diagnosis not present

## 2018-08-28 DIAGNOSIS — G894 Chronic pain syndrome: Secondary | ICD-10-CM | POA: Diagnosis not present

## 2018-08-28 DIAGNOSIS — M2550 Pain in unspecified joint: Secondary | ICD-10-CM | POA: Diagnosis not present

## 2018-08-28 DIAGNOSIS — M542 Cervicalgia: Secondary | ICD-10-CM | POA: Diagnosis not present

## 2018-09-04 ENCOUNTER — Other Ambulatory Visit: Payer: Self-pay | Admitting: Internal Medicine

## 2018-09-04 DIAGNOSIS — I2510 Atherosclerotic heart disease of native coronary artery without angina pectoris: Secondary | ICD-10-CM

## 2018-09-04 DIAGNOSIS — I10 Essential (primary) hypertension: Secondary | ICD-10-CM

## 2018-09-26 ENCOUNTER — Encounter: Payer: Self-pay | Admitting: Internal Medicine

## 2018-09-26 ENCOUNTER — Ambulatory Visit (INDEPENDENT_AMBULATORY_CARE_PROVIDER_SITE_OTHER): Payer: 59 | Admitting: Internal Medicine

## 2018-09-26 VITALS — BP 106/68 | HR 70 | Temp 98.0°F | Ht 65.0 in | Wt 205.0 lb

## 2018-09-26 DIAGNOSIS — J22 Unspecified acute lower respiratory infection: Secondary | ICD-10-CM

## 2018-09-26 MED ORDER — CLARITHROMYCIN 500 MG PO TABS: 500.0000 mg | ORAL_TABLET | Freq: Two times a day (BID) | ORAL | 0 refills | Status: DC

## 2018-09-26 MED ORDER — HYDROCODONE-HOMATROPINE 5-1.5 MG/5ML PO SYRP: 5.0000 mL | ORAL_SOLUTION | Freq: Three times a day (TID) | ORAL | 0 refills | Status: DC | PRN

## 2018-09-26 MED ORDER — PREDNISONE 10 MG PO TABS: ORAL_TABLET | ORAL | 0 refills | Status: DC

## 2018-09-26 NOTE — Progress Notes (Signed)
   Subjective:    Patient ID: [name], female    DOB: [birth year], 52 y.o.   MRN: [id number]  HPI 52 year old Female smoker in today with cough.  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  Denies sore throat.  Has had history of asthmatic bronchitis.  No fever or chills but has malaise and fatigue.  History of coronary artery disease status post MI.  Last had coughing and wheezing in June 2019.  Tends to need prednisone to break cough/wheezing cycle.    Review of Systems see above     Objective:   Physical Exam Skin warm and dry.  Nodes none.  TMs and pharynx are clear.  Neck is supple.  Chest clear to auscultation.  She sounds a bit hoarse.       Assessment & Plan:  Acute lower respiratory infection   Continues to smoke  History of coronary artery disease  Plan: Prednisone 10 mg tablets going from 60 mg to 0 mg over 7 days.  Biaxin 500 mg twice daily for 10 days.  Use Ventolin  inhaler up to 4 times daily as needed Hycodan 1 teaspoon p.o. every 8 hours as needed cough.  Rest and drink plenty of fluids.  15 minutes spent with patient

## 2018-10-02 NOTE — Patient Instructions (Signed)
Biaxin 500 mg twice daily for 10 days.  Hycodan 1 teaspoon p.o. every 6 hours as needed cough.  Use Ventolin inhaler if issues with wheezing or shortness of breath.  Take prednisone and tapering course as directed.

## 2018-10-13 ENCOUNTER — Telehealth: Payer: Self-pay

## 2018-10-13 DIAGNOSIS — K1239 Other oral mucositis (ulcerative): Secondary | ICD-10-CM

## 2018-10-13 NOTE — Telephone Encounter (Signed)
Called in to [facility]. Patient was notified.

## 2018-10-13 NOTE — Telephone Encounter (Signed)
Patient called states she was here last week and was put on prednisone and now her mouth feels raw and she noticed some white patches on her on her tongue and side on mouth, she would like to know if you prescribe something she said maybe a magic mouthwash?

## 2018-10-13 NOTE — Telephone Encounter (Signed)
Call in [place] (16 oz ) 2 teaspoons swish do not swallow 4 times a day

## 2018-10-26 ENCOUNTER — Other Ambulatory Visit: Payer: Self-pay | Admitting: Cardiovascular Disease

## 2018-10-26 DIAGNOSIS — G894 Chronic pain syndrome: Secondary | ICD-10-CM | POA: Diagnosis not present

## 2018-10-26 DIAGNOSIS — M47817 Spondylosis without myelopathy or radiculopathy, lumbosacral region: Secondary | ICD-10-CM | POA: Diagnosis not present

## 2018-10-26 DIAGNOSIS — I10 Essential (primary) hypertension: Secondary | ICD-10-CM

## 2018-10-26 DIAGNOSIS — M542 Cervicalgia: Secondary | ICD-10-CM | POA: Diagnosis not present

## 2018-10-26 DIAGNOSIS — M2550 Pain in unspecified joint: Secondary | ICD-10-CM | POA: Diagnosis not present

## 2018-10-26 DIAGNOSIS — I2510 Atherosclerotic heart disease of native coronary artery without angina pectoris: Secondary | ICD-10-CM

## 2018-11-09 ENCOUNTER — Other Ambulatory Visit: Payer: Self-pay

## 2018-11-09 MED ORDER — DESVENLAFAXINE SUCCINATE ER 50 MG PO TB24: 50.0000 mg | ORAL_TABLET | Freq: Every day | ORAL | 2 refills | Status: DC

## 2018-12-21 DIAGNOSIS — M542 Cervicalgia: Secondary | ICD-10-CM | POA: Diagnosis not present

## 2018-12-21 DIAGNOSIS — G894 Chronic pain syndrome: Secondary | ICD-10-CM | POA: Diagnosis not present

## 2018-12-21 DIAGNOSIS — M2550 Pain in unspecified joint: Secondary | ICD-10-CM | POA: Diagnosis not present

## 2018-12-21 DIAGNOSIS — Z79891 Long term (current) use of opiate analgesic: Secondary | ICD-10-CM | POA: Diagnosis not present

## 2018-12-21 DIAGNOSIS — M47817 Spondylosis without myelopathy or radiculopathy, lumbosacral region: Secondary | ICD-10-CM | POA: Diagnosis not present

## 2018-12-26 ENCOUNTER — Other Ambulatory Visit: Payer: 59 | Admitting: Internal Medicine

## 2018-12-26 DIAGNOSIS — R7302 Impaired glucose tolerance (oral): Secondary | ICD-10-CM | POA: Diagnosis not present

## 2018-12-26 DIAGNOSIS — E785 Hyperlipidemia, unspecified: Secondary | ICD-10-CM | POA: Diagnosis not present

## 2018-12-26 NOTE — Addendum Note (Signed)
Addended by: Mady Haagensen on: 12/26/2018 09:22 AM   Modules accepted: Orders

## 2018-12-27 LAB — HEPATIC FUNCTION PANEL
AG Ratio: 1.9 (calc) (ref 1.0–2.5)
ALT: 24 U/L (ref 6–29)
AST: 21 U/L (ref 10–35)
Albumin: 4 g/dL (ref 3.6–5.1)
Alkaline phosphatase (APISO): 86 U/L (ref 33–130)
Bilirubin, Direct: 0.1 mg/dL (ref 0.0–0.2)
Globulin: 2.1 g/dL (calc) (ref 1.9–3.7)
Indirect Bilirubin: 0.3 mg/dL (calc) (ref 0.2–1.2)
Total Bilirubin: 0.4 mg/dL (ref 0.2–1.2)
Total Protein: 6.1 g/dL (ref 6.1–8.1)

## 2018-12-27 LAB — LIPID PANEL
Cholesterol: 133 mg/dL (ref <200)
HDL: 45 mg/dL — ABNORMAL LOW (ref >50)
LDL Cholesterol (Calc): 68 mg/dL (calc)
Non-HDL Cholesterol (Calc): 88 mg/dL (calc) (ref <130)
Total CHOL/HDL Ratio: 3 (calc) (ref <5.0)
Triglycerides: 114 mg/dL (ref <150)

## 2018-12-27 LAB — MICROALBUMIN / CREATININE URINE RATIO
Creatinine, Urine: 71 mg/dL (ref 20–275)
Microalb Creat Ratio: 28 mcg/mg creat (ref <30)
Microalb, Ur: 2 mg/dL

## 2018-12-27 LAB — HEMOGLOBIN A1C
Hgb A1c MFr Bld: 5.9 % of total Hgb — ABNORMAL HIGH (ref <5.7)
Mean Plasma Glucose: 123 (calc)
eAG (mmol/L): 6.8 (calc)

## 2018-12-28 ENCOUNTER — Ambulatory Visit: Payer: 59 | Admitting: Internal Medicine

## 2018-12-28 ENCOUNTER — Encounter: Payer: Self-pay | Admitting: Internal Medicine

## 2018-12-28 VITALS — BP 90/60 | HR 68 | Ht 65.0 in | Wt 202.0 lb

## 2018-12-28 DIAGNOSIS — E039 Hypothyroidism, unspecified: Secondary | ICD-10-CM | POA: Diagnosis not present

## 2018-12-28 DIAGNOSIS — I10 Essential (primary) hypertension: Secondary | ICD-10-CM

## 2018-12-28 DIAGNOSIS — E8881 Metabolic syndrome: Secondary | ICD-10-CM

## 2018-12-28 DIAGNOSIS — G4733 Obstructive sleep apnea (adult) (pediatric): Secondary | ICD-10-CM | POA: Diagnosis not present

## 2018-12-28 DIAGNOSIS — F17200 Nicotine dependence, unspecified, uncomplicated: Secondary | ICD-10-CM | POA: Diagnosis not present

## 2018-12-28 DIAGNOSIS — R7302 Impaired glucose tolerance (oral): Secondary | ICD-10-CM

## 2018-12-28 DIAGNOSIS — F419 Anxiety disorder, unspecified: Secondary | ICD-10-CM | POA: Diagnosis not present

## 2018-12-28 DIAGNOSIS — F439 Reaction to severe stress, unspecified: Secondary | ICD-10-CM

## 2018-12-28 DIAGNOSIS — G8929 Other chronic pain: Secondary | ICD-10-CM

## 2018-12-28 DIAGNOSIS — E7849 Other hyperlipidemia: Secondary | ICD-10-CM

## 2018-12-28 DIAGNOSIS — Z8679 Personal history of other diseases of the circulatory system: Secondary | ICD-10-CM

## 2018-12-28 DIAGNOSIS — M7918 Myalgia, other site: Secondary | ICD-10-CM

## 2018-12-28 DIAGNOSIS — K219 Gastro-esophageal reflux disease without esophagitis: Secondary | ICD-10-CM | POA: Diagnosis not present

## 2018-12-28 DIAGNOSIS — I252 Old myocardial infarction: Secondary | ICD-10-CM

## 2018-12-28 DIAGNOSIS — F329 Major depressive disorder, single episode, unspecified: Secondary | ICD-10-CM

## 2018-12-28 DIAGNOSIS — F32A Depression, unspecified: Secondary | ICD-10-CM

## 2018-12-28 NOTE — Patient Instructions (Signed)
Please quit smoking.  Continue diet exercise and weight loss efforts.  Continue same medications and follow-up with physical exam in 6 months.

## 2018-12-28 NOTE — Progress Notes (Signed)
   Subjective:    Patient ID: [name], female    DOB: 07-08-1966, 53 y.o.   MRN: [id number]  HPI 53 year old Female in today for 66-month follow-up.  History of coronary artery disease, history of smoking, mild sleep apnea, chronic fibromyalgia type pain seen at [facility].  History of acute anterior STEMI December 2013.  Cardiac cath demonstrated subtotal occlusion of a very small diagonal branch with patency of the LAD.  Normal LV function.  Suspicion was for component of vasospasm.  Treated medically and has had no recurrent ischemic events.  History of impaired glucose tolerance with hemoglobin A1c 5.9% and previously 5.8% in July.  Has low HDL of 45 otherwise normal lipid panel and liver functions on statin medication.  With regard to hypertension remains on Vasotec and amlodipine  Is on Xanax for anxiety on a as needed basis  Remains on levothyroxine for hypothyroidism  Remains on Protonix for GE reflux  History of exacerbations of  bronchitis from time to time which are symptomatic with wheezing and treated with Ventolin as needed and usually require antibiotics and prednisone to resolve.  Saw Dr. Trey Paula in 2017.  Pulmonary functions did not show COPD but did show small airways obstruction which may represent initial stages of COPD.  Dr. Lissa Hoard told her that small airways disease was clearly due to cigarette use and was likely the cause of her shortness of breath in 2017.  He encouraged her to quit smoking.  She also has hypothyroidism with normal TSH July 2019 on thyroid replacement      Review of Systems see above-some fatigue.  Worried about 1 of her sons.     Objective:   Physical Exam Blood pressure 90/60, pulse 68, pulse oximetry 97%  BMI 33.61 Vital signs reviewed.  Skin warm and dry.  Nodes none.  Neck is supple.  No thyromegaly.  Chest clear.  Cardiac exam regular rate and rhythm normal S1 and S2.  Extremities without edema.        Assessment & Plan:  Hypothyroidism-TSH not checked with this visit.  Continue thyroid replacement  Hyperlipidemia-stable on statin medication  Hypertension-stable on current regimen and will not change although blood pressure today is slightly low at 90/60  History of anxiety treated with Xanax  Small airways disease treated with as needed Ventolin inhaler  History of depression/situational stress and fatigue treated with Pristiq  History of chronic pain treated at [facility]  Continues to smoke- asked patient once again to quit.  May be aggravating small airways disease per Dr. Lake Bells  BMI 33.61-needs to be motivated to diet and exercise  History of coronary disease status post STEMI with component of vasospasm followed by Cardiology

## 2019-02-01 ENCOUNTER — Other Ambulatory Visit: Payer: Self-pay | Admitting: Internal Medicine

## 2019-02-01 DIAGNOSIS — Z1231 Encounter for screening mammogram for malignant neoplasm of breast: Secondary | ICD-10-CM

## 2019-02-15 DIAGNOSIS — M542 Cervicalgia: Secondary | ICD-10-CM | POA: Diagnosis not present

## 2019-02-15 DIAGNOSIS — M2550 Pain in unspecified joint: Secondary | ICD-10-CM | POA: Diagnosis not present

## 2019-02-15 DIAGNOSIS — G894 Chronic pain syndrome: Secondary | ICD-10-CM | POA: Diagnosis not present

## 2019-02-15 DIAGNOSIS — M47817 Spondylosis without myelopathy or radiculopathy, lumbosacral region: Secondary | ICD-10-CM | POA: Diagnosis not present

## 2019-03-05 ENCOUNTER — Other Ambulatory Visit: Payer: Self-pay | Admitting: Internal Medicine

## 2019-03-05 DIAGNOSIS — I10 Essential (primary) hypertension: Secondary | ICD-10-CM

## 2019-03-05 DIAGNOSIS — I2510 Atherosclerotic heart disease of native coronary artery without angina pectoris: Secondary | ICD-10-CM

## 2019-03-12 ENCOUNTER — Ambulatory Visit: Payer: 59

## 2019-03-29 ENCOUNTER — Telehealth: Payer: Self-pay | Admitting: Internal Medicine

## 2019-03-29 ENCOUNTER — Encounter: Payer: Self-pay | Admitting: Internal Medicine

## 2019-03-29 ENCOUNTER — Ambulatory Visit (INDEPENDENT_AMBULATORY_CARE_PROVIDER_SITE_OTHER): Payer: 59 | Admitting: Internal Medicine

## 2019-03-29 VITALS — Temp 97.9°F | Wt 200.0 lb

## 2019-03-29 DIAGNOSIS — H6692 Otitis media, unspecified, left ear: Secondary | ICD-10-CM

## 2019-03-29 DIAGNOSIS — F17200 Nicotine dependence, unspecified, uncomplicated: Secondary | ICD-10-CM | POA: Diagnosis not present

## 2019-03-29 DIAGNOSIS — I252 Old myocardial infarction: Secondary | ICD-10-CM

## 2019-03-29 DIAGNOSIS — R002 Palpitations: Secondary | ICD-10-CM | POA: Diagnosis not present

## 2019-03-29 MED ORDER — CLARITHROMYCIN 500 MG PO TABS: 500.0000 mg | ORAL_TABLET | Freq: Two times a day (BID) | ORAL | 0 refills | Status: DC

## 2019-03-29 MED ORDER — HYDROCODONE-HOMATROPINE 5-1.5 MG/5ML PO SYRP: 5.0000 mL | ORAL_SOLUTION | Freq: Three times a day (TID) | ORAL | 0 refills | Status: DC | PRN

## 2019-03-29 MED ORDER — PREDNISONE 10 MG PO TABS: ORAL_TABLET | ORAL | 0 refills | Status: DC

## 2019-03-29 NOTE — Telephone Encounter (Signed)
[name] [phone]  [name] called to say she is having popping and crackling in Left ear, she did have dental work about 3 weeks ago. Also she is experiencing papulations lasting longer than normal, she thinks it could be anxiety but wanted to talk with you about it.

## 2019-03-29 NOTE — Telephone Encounter (Signed)
Set up virtual visit 

## 2019-03-29 NOTE — Telephone Encounter (Signed)
Scheduled Virtual visit °

## 2019-04-05 NOTE — Patient Instructions (Signed)
Consider smoking cessation as smoking  can be a cause palpitations.  Consider intermittent beta-blocker therapy if palpitations are sustained or bothersome.  A 24-hour Holter monitor can be obtained in the future.  For left otitis media prescribed a tapering course of prednisone since ear is stopped up in addition to Biaxin twice daily for 10 days.

## 2019-04-05 NOTE — Progress Notes (Signed)
   Subjective:    Patient ID: [name], female    DOB: 02-06-1966, 53 y.o.   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  Patient complaining of ear pain and congestion.  No known exposure to COVID-19 patient.  Interactive audio and video telecommunications were achieved today between this provider and patient.  Patient gives consent for visit in this format during the coronavirus pandemic.  Therefore, ear could not be examined today and is pending.  But by history she gives a clear history of otitis media.  She has not been febrile.  No cough fever or chills.  Also, she is noted some palpitations from time to time.  She is status post MI and has been concerned about it.  She is not taking decongestants and does not take any excessive caffeine.  Some stress at work.  The palpitations are brief and are not sustained.  Medications reviewed.  She is on thyroid replacement medication and last TSH was normal in July 2019.  She has impaired glucose tolerance and hemoglobin A1c was 5.9% in January.  She is not on a beta-blocker.  That could be added intermittently if she so desired.  However she does not want to do that at present time.  Nicotine can cause palpitations as well as decongestants and stress.  She does not consume alcohol.    Review of Systems see above     Objective:   Physical Exam Not examined in his family but detailed history taken       Assessment & Plan:  Palpitations-sound benign and not sustained.  If symptoms persist she can have 24-hour Holter monitor.  She could also take intermittent as needed beta-blocker.  She needs to quit smoking.  Nicotine can cause palpitations.  Do not take over-the-counter decongestants.  Left otitis media by history.  Ear is stopped up.  She will be given prednisone 10 mg to take in a tapering course  going from 60 mg to 0 mg over 7 days.  She will be given Biaxin 500 mg twice daily for 10 days.  25 minutes spent with patient including reviewing old records and history in addition to taking detailed history, making treatment plan and medical decision making.

## 2019-04-06 ENCOUNTER — Telehealth: Payer: Self-pay

## 2019-04-06 MED ORDER — FLUCONAZOLE 150 MG PO TABS: 150.0000 mg | ORAL_TABLET | Freq: Once | ORAL | 0 refills | Status: AC

## 2019-04-06 NOTE — Telephone Encounter (Signed)
Patient called states the antibiotics she was put on a few days ago have caused white patches on her tongue and on the roof of her mouth she said her mouth and tongue are very sensitive. She thinks is thrush and she would like to know if you can prescribed something for her?

## 2019-04-06 NOTE — Telephone Encounter (Signed)
Please call in Diflucan 150 mg tab with one refill

## 2019-04-16 DIAGNOSIS — M542 Cervicalgia: Secondary | ICD-10-CM | POA: Diagnosis not present

## 2019-04-16 DIAGNOSIS — F329 Major depressive disorder, single episode, unspecified: Secondary | ICD-10-CM | POA: Diagnosis not present

## 2019-04-16 DIAGNOSIS — M2550 Pain in unspecified joint: Secondary | ICD-10-CM | POA: Diagnosis not present

## 2019-04-16 DIAGNOSIS — Z79891 Long term (current) use of opiate analgesic: Secondary | ICD-10-CM | POA: Diagnosis not present

## 2019-04-16 DIAGNOSIS — G894 Chronic pain syndrome: Secondary | ICD-10-CM | POA: Diagnosis not present

## 2019-04-16 DIAGNOSIS — M722 Plantar fascial fibromatosis: Secondary | ICD-10-CM | POA: Diagnosis not present

## 2019-04-16 DIAGNOSIS — M47817 Spondylosis without myelopathy or radiculopathy, lumbosacral region: Secondary | ICD-10-CM | POA: Diagnosis not present

## 2019-05-02 ENCOUNTER — Other Ambulatory Visit: Payer: Self-pay | Admitting: Cardiovascular Disease

## 2019-05-02 DIAGNOSIS — I2510 Atherosclerotic heart disease of native coronary artery without angina pectoris: Secondary | ICD-10-CM

## 2019-05-02 DIAGNOSIS — I10 Essential (primary) hypertension: Secondary | ICD-10-CM

## 2019-05-24 ENCOUNTER — Ambulatory Visit
Admission: RE | Admit: 2019-05-24 | Discharge: 2019-05-24 | Disposition: A | Discharge status: Home / Self Care | Payer: 59 | Source: Ambulatory Visit | Attending: Internal Medicine | Admitting: Internal Medicine

## 2019-05-24 ENCOUNTER — Other Ambulatory Visit: Payer: Self-pay

## 2019-05-24 DIAGNOSIS — Z1231 Encounter for screening mammogram for malignant neoplasm of breast: Secondary | ICD-10-CM | POA: Diagnosis not present

## 2019-05-29 ENCOUNTER — Ambulatory Visit: Payer: 59 | Admitting: Physician Assistant

## 2019-06-05 ENCOUNTER — Telehealth: Payer: Self-pay | Admitting: Physician Assistant

## 2019-06-05 NOTE — Telephone Encounter (Signed)
New Message ° ° ° °Left message to confirm appt and answer COVID questions  °

## 2019-06-06 ENCOUNTER — Encounter: Payer: Self-pay | Admitting: Physician Assistant

## 2019-06-06 ENCOUNTER — Ambulatory Visit: Payer: 59 | Admitting: Physician Assistant

## 2019-06-06 ENCOUNTER — Other Ambulatory Visit: Payer: Self-pay

## 2019-06-06 VITALS — BP 134/84 | HR 69 | Ht 65.0 in | Wt 201.0 lb

## 2019-06-06 DIAGNOSIS — E785 Hyperlipidemia, unspecified: Secondary | ICD-10-CM | POA: Diagnosis not present

## 2019-06-06 DIAGNOSIS — R002 Palpitations: Secondary | ICD-10-CM

## 2019-06-06 DIAGNOSIS — I2510 Atherosclerotic heart disease of native coronary artery without angina pectoris: Secondary | ICD-10-CM | POA: Diagnosis not present

## 2019-06-06 DIAGNOSIS — Z720 Tobacco use: Secondary | ICD-10-CM

## 2019-06-06 DIAGNOSIS — I10 Essential (primary) hypertension: Secondary | ICD-10-CM

## 2019-06-06 NOTE — Patient Instructions (Signed)
Medication Instructions:  Your physician recommends that you continue on your current medications as directed. Please refer to the Current Medication list given to you today.   If you need a refill on your cardiac medications before your next appointment, please call your pharmacy.   Lab work:  NONE ORDERED  TODAY   If you have labs (blood work) drawn today and your tests are completely normal, you will receive your results only by: Marland Kitchen MyChart Message (if you have MyChart) OR . A paper copy in the mail If you have any lab test that is abnormal or we need to change your treatment, we will call you to review the results.  Testing/Procedures: NONE ORDERED  TODAY    Follow-Up: At [facility], you and your health needs are our priority.  As part of our continuing mission to provide you with exceptional heart care, we have created designated Provider Care Teams.  These Care Teams include your primary Cardiologist (physician) and Advanced Practice Providers (APPs -  Physician Assistants and Nurse Practitioners) who all work together to provide you with the care you need, when you need it. You will need a follow up appointment in:  1 years.  Please call our office 2 months in advance to schedule this appointment.  You may see Sherren Mocha, MD or one of the following Advanced Practice Providers on your designated Care Team: Richardson Dopp, PA-C [place], [place] . Daune Perch, NP  Any Other Special Instructions Will Be Listed Below (If Applicable).  Please keep daily log of blood pressures and send to Mychart for Provider to see

## 2019-06-06 NOTE — Progress Notes (Signed)
Cardiology Office Note:    Date:  06/06/2019   ID:  [name], DOB 05/09/1966, MRN [id number]  PCP:  Elby Showers, MD  Cardiologist:  Sherren Mocha, MD  Electrophysiologist:  None   Referring MD: Elby Showers, MD   Chief Complaint  Patient presents with  . Follow-up    CAD follow-up    History of Present Illness:    [name] is a 53 y.o. female with a history of CAD, possible coronary vasospasm, hypertension, hypothyroidism, and GERD, who presents today for follow-up of CAD.   Patient initially presented in 11/2012 with an acute anterior wall MI. Cardiac catheterization showed subtotal occlusion of a very small diagonal branch with patency of the LAD and no other significant CAD. LV function was within normal limits. There was suspicion for a component of coronary vasospasm. She was treated medically and has had no recurrent ischemic events since that time.   In 2018, patient reported increased frequency of palpitations. A Holter Monitor was ordered which showed sinus rhythm with periods of sinus bradycardia and sinus tachycardia as well as single PVC's and PAC's but no sustained arrhythmia or pathologic pauses. Patient reported palpitations occurred at rest and seemed to be related to stress at work. She worked on reducing triggering factors (cut back on caffeine and worked on stress reduction) and palpations improved. Addition of beta-blocker was discussed but heart rate was in the 60's and BP was on the low side so it was not added at that time.  Patient presents today for follow-up. Patient reports occasional left-sided chest soreness that is reproducible when she presses on her chest wall and improves with PT for her neck but denies any true chest pain or symptoms similar to event in 2013. She reports palpitations (irregular beats) that have become more frequent. She thinks these are related to stress. Denies any increase in caffeine intake. No  shortness of breath, lightheadedness, dizziness or syncope. She has some minimal ankle swelling at the end of a 12 hour shift (works as a Therapist, sports at Urgent Care) but no orthopnea or PND. Swelling resolves by the morning. She also reports feeling very fatigued but states she has had this for years. PCP has brought up possible depression in the past but patient does not think that is it. No fevers, chills, body aches, or new cough concerning for COVID.  Patient continues to smoke 1/2 pack per day. She is aware that she needs to quit but has no interest at this time.   Past Medical History:  Diagnosis Date  . ADHD (attention deficit hyperactivity disorder)   . Anxiety   . Asthma   . CAD (coronary artery disease)    a. 11/2012 Acute Anterior STEMI/Cath: Nonobs dzs except subtotal occlusion of small, 4mm D2, EF 55-65%;  b. 11/2012  Echo: EF 60-65%, nl wall motion, Gr 2 DD.  Marland Kitchen Chronic kidney disease    in setting of HSP 2006  . Coronary vasospasm (HCC)    a. presumed  . Depression   . GERD (gastroesophageal reflux disease)   . 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 Hypertension    Controlled - she states she is on BP meds for HSP  . Hypothyroidism   . Migraines   . Sleep apnea    "lost 50#; no  problems w/it now" (11/22/2012)  . 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 Tobacco abuse     Past Surgical History:  Procedure Laterality Date  . APPENDECTOMY  1970's  . CARDIAC CATHETERIZATION  11/22/2012   "first one" (11/22/2012)  . [place]; 1996; 1999  . DILATION AND CURETTAGE OF UTERUS  1990's  . LEFT HEART CATHETERIZATION WITH CORONARY ANGIOGRAM N/A 11/22/2012   Procedure: LEFT HEART CATHETERIZATION WITH CORONARY ANGIOGRAM;  Surgeon: Sherren Mocha, MD;  Location: [facility]  CATH LAB;  Service: Cardiovascular;  Laterality: N/A;    Current Medications: Current Meds  Medication Sig  . ALPRAZolam (XANAX) 0.5 MG tablet Take 1 tablet (0.5 mg total) by mouth 2 (two) times daily as needed for anxiety.  Marland Kitchen amLODipine (NORVASC) 5 MG tablet TAKE 1 TABLET (5 MG TOTAL) BY MOUTH DAILY.  Marland Kitchen aspirin EC 81 MG EC tablet Take 1 tablet (81 mg total) by mouth daily.  Marland Kitchen atorvastatin (LIPITOR) 40 MG tablet TAKE 1 TABLET BY MOUTH DAILY AT 6:00 PM  . calcium-vitamin D (OSCAL WITH D) 500-200 MG-UNIT per tablet Take 1 tablet by mouth daily.  . Cholecalciferol (VITAMIN D3) 2000 units TABS Take 2,000 Units by mouth daily.  Marland Kitchen desvenlafaxine (PRISTIQ) 50 MG 24 hr tablet Take 1 tablet (50 mg total) by mouth daily.  . enalapril (VASOTEC) 10 MG tablet TAKE 1 TABLET (10 MG TOTAL) BY MOUTH DAILY.  Marland Kitchen HYDROcodone-acetaminophen (NORCO/VICODIN) 5-325 MG tablet Take 1 tablet by mouth every 6 (six) hours as needed for moderate pain.  Marland Kitchen levothyroxine (SYNTHROID, LEVOTHROID) 75 MCG tablet TAKE 1 TABLET (75 MCG TOTAL) BY MOUTH DAILY.  Marland Kitchen loratadine (CLARITIN) 10 MG tablet Take 10 mg by mouth daily.  . nitroGLYCERIN (NITROSTAT) 0.4 MG SL tablet Place 1 tablet (0.4 mg total) under the tongue every 5 (five) minutes as needed for chest pain.  . pantoprazole (PROTONIX) 40 MG tablet TAKE 1 TABLET BY MOUTH TWICE A DAY BEFORE A MEAL.     Allergies:   Levaquin [levofloxacin] and Nsaids   Social History   Socioeconomic History  . Marital status: Married    Spouse name: Not on file  . Number of children: Not on file  . Years of education: Not on file  . Highest education level: Not on file  Occupational History  . Occupation: RN/UC    Employer: [employer]    Comment: [employer]  Social Needs  . Financial resource strain: Not on file  . Food insecurity    Worry: Not on file    Inability: Not on file  . Transportation needs    Medical: Not on file    Non-medical: Not on file  Tobacco Use   . Smoking status: Current Every Day Smoker    Packs/day: 0.50    Years: 25.00    Pack years: 12.50    Types: Cigarettes  . Smokeless tobacco: Never Used  Substance and Sexual Activity  . Alcohol use: No  . Drug use: No  . Sexual activity: Yes  Lifestyle  . Physical activity    Days per week: Not on file    Minutes per session: Not on file  . Stress: Not on file  Relationships  . Social Herbalist on phone: Not on file    Gets together: Not on file    Attends religious service: Not on file    Active member  of club or organization: Not on file    Attends meetings of clubs or organizations: Not on file    Relationship status: Not on file  Other Topics Concern  . Not on file  Social History Narrative  . Not on file     Family History: The patient's family history includes Hypertension in her mother; Stroke in her father. There is no history of Heart disease.  ROS:   Please see the history of present illness.    All other systems reviewed and are negative.  EKGs/Labs/Other Studies Reviewed:    The following studies were reviewed today:  Holter Monitor 08/08/2017: Sinus rhythm with periods of sinus bradycardia and sinus tachycardia Single PVC's and PAC's No sustained arrhythmia or pathologic pauses _______________  Echocardiogram 11/23/2012: Left ventricle: The cavity size was normal. Wall thickness  was normal. Systolic function was normal. The estimated  ejection fraction was in the range of 60% to 65%. Wall  motion was normal; there were no regional wall motion  abnormalities. Features are consistent with a pseudonormal  left ventricular filling pattern, with concomitant abnormal  relaxation and increased filling pressure (grade 2 diastolic  dysfunction). _______________  Cardiac Catheterization 11/22/2012: Final Conclusions:   1. Severe stenosis of the small diagonal subbranch, not amenable to PCI. 2. Minor nonobstructive LAD and right coronary  artery stenoses 3. Normal left ventricular systolic function  Recommendations: Suspect this event was related to either her small diagonal occlusion or a more significant coronary vasospastic event. The extent of injury current on her initial ECG would suggest the latter. Smoking cessation will be imperative. We will start her on dual antiplatelet therapy with aspirin and Plavix. She should also be started on a calcium channel blocker. Will not initiate nitrates at this point considering her history of severe migraine headaches.  EKG:  EKG is  ordered today. The EKG ordered today demonstrates normal sinus rhythm, rate 69 bpm, with isolated T wave inversions in lead III.   Recent Labs: 06/20/2018: BUN 7; Creat 0.83; Hemoglobin 15.7; Platelets 210; Potassium 4.6; Sodium 141; TSH 1.43 12/26/2018: ALT 24  Recent Lipid Panel    Component Value Date/Time   CHOL 133 12/26/2018 1001   TRIG 114 12/26/2018 1001   HDL 45 (L) 12/26/2018 1001   CHOLHDL 3.0 12/26/2018 1001   VLDL 23 06/21/2017 1014   LDLCALC 68 12/26/2018 1001    Physical Exam:    VS:  BP 134/84   Pulse 69   Ht 5\' 5"  (1.651 m)   Wt 201 lb (91.2 kg)   BMI 33.45 kg/m     Wt Readings from Last 3 Encounters:  06/06/19 201 lb (91.2 kg)  03/29/19 200 lb (90.7 kg)  12/28/18 202 lb (91.6 kg)     GEN: Well nourished, well developed Caucasian female resting comfortably in no acute distress. HEENT: Normal NECK: Supple. No JVD. LYMPHATICS: No lymphadenopathy CARDIAC: RRR. No murmurs, gallops, or rubs. RESPIRATORY: No increased work of breathing. Clear to auscultation bilaterally. No wheezes, rhonchi, or rales.  ABDOMEN: Abdomen soft, non-distended, and non-tender. Bowel sounds present. MUSCULOSKELETAL:  No edema. No deformity. SKIN: Warm and dry. NEUROLOGIC:  Alert and oriented x3. No focal deficits. PSYCHIATRIC:  Normal affect. Responds appropriately.   ASSESSMENT:    1. Coronary artery disease involving native coronary  artery of native heart without angina pectoris   2. Essential hypertension   3. Hyperlipidemia, unspecified hyperlipidemia type   4. Tobacco use   5. Palpitations    PLAN:  CAD Patient s/p acute anterior MI in 11/2012. Cardiac cath showed subtotal occlusion of a very small diagonal branch with patency of the LAD and no other significant CAD. LV function was within normal limits. There was suspicion for a component of coronary vasospasm. The patient is stable. Patient reports some occasional chest tightness that is reproducible with palpation of chest wall but no true angina. Continue current medical therapy with Aspirin 81mg  daily, Lipitor 40mg  daily, and Amlodipine 5mg  daily. Will refill Nitro so that she has that on hand if needed (she has not needed it).   Hypertension BP mildly elevated at 134/84 today. Patient states this is higher than usual. Thinks it may be related to stress. Will continue current Amlodipine 5mg  daily and Enalapril 10mg  daily at this time. Patient will keep a log of BP and notify use if BP remains elevated.   Hyperlipidemia Most recent lipid panel from 12/26/2018: Total Cholesterol 133, Triglycerides 114, HDL 45, LDL 68. Goal <70 given CAD. Continue Lipitor 40mg  daily.   Palpitations - Patient continues to report occasional irregular heart beat. No lightheadedness, dizziness, or syncope.  Had a Holter Monitor in 2018 which showed  showed sinus rhythm with periods of sinus bradycardia and sinus tachycardia as well as single PVC's and PAC's but no sustained arrhythmia or pathologic pauses. Thinks palpitations may be related to stress. Discussed adding low dose beta-blocker but patient wanted to wait at this time. Patient to notify us if palpitations worsen.   Sleep Apnea - Patient reports last sleep study within the past 5 years was normal.   Tobacco Abuse - Patient continues to smoke 1/2 pack per day. She is aware that she needs to quit but has no interest at this  time. She is aware of resources.    Medication Adjustments/Labs and Tests Ordered: Current medicines are reviewed at length with the patient today.  Concerns regarding medicines are outlined above.  Orders Placed This Encounter  Procedures  . EKG 12-Lead   No orders of the defined types were placed in this encounter.   Patient Instructions  Medication Instructions:  Your physician recommends that you continue on your current medications as directed. Please refer to the Current Medication list given to you today.   If you need a refill on your cardiac medications before your next appointment, please call your pharmacy.   Lab work:  NONE ORDERED  TODAY   If you have labs (blood work) drawn today and your tests are completely normal, you will receive your results only by: Marland Kitchen MyChart Message (if you have MyChart) OR . A paper copy in the mail If you have any lab test that is abnormal or we need to change your treatment, we will call you to review the results.  Testing/Procedures: NONE ORDERED  TODAY    Follow-Up: At [facility], you and your health needs are our priority.  As part of our continuing mission to provide you with exceptional heart care, we have created designated Provider Care Teams.  These Care Teams include your primary Cardiologist (physician) and Advanced Practice Providers (APPs -  Physician Assistants and Nurse Practitioners) who all work together to provide you with the care you need, when you need it. You will need a follow up appointment in:  1 years.  Please call our office 2 months in advance to schedule this appointment.  You may see Sherren Mocha, MD or one of the following Advanced Practice Providers on your designated Care Team: Richardson Dopp, [place]  Vin Bhagat, PA-C . Daune Perch, NP  Any Other Special Instructions Will Be Listed Below (If Applicable).  Please keep daily log of blood pressures and send to Mychart for Provider to see         Signed, Darreld Mclean, PA-C  06/06/2019 2:47 PM    [place]

## 2019-06-11 DIAGNOSIS — M47817 Spondylosis without myelopathy or radiculopathy, lumbosacral region: Secondary | ICD-10-CM | POA: Diagnosis not present

## 2019-06-11 DIAGNOSIS — M542 Cervicalgia: Secondary | ICD-10-CM | POA: Diagnosis not present

## 2019-06-11 DIAGNOSIS — M2550 Pain in unspecified joint: Secondary | ICD-10-CM | POA: Diagnosis not present

## 2019-06-11 DIAGNOSIS — G894 Chronic pain syndrome: Secondary | ICD-10-CM | POA: Diagnosis not present

## 2019-06-28 ENCOUNTER — Other Ambulatory Visit: Payer: 59 | Admitting: Internal Medicine

## 2019-06-28 DIAGNOSIS — Z0000 Encounter for general adult medical examination without abnormal findings: Secondary | ICD-10-CM | POA: Diagnosis not present

## 2019-06-28 DIAGNOSIS — E039 Hypothyroidism, unspecified: Secondary | ICD-10-CM

## 2019-06-28 DIAGNOSIS — E785 Hyperlipidemia, unspecified: Secondary | ICD-10-CM

## 2019-06-28 DIAGNOSIS — I10 Essential (primary) hypertension: Secondary | ICD-10-CM | POA: Diagnosis not present

## 2019-06-28 DIAGNOSIS — F17200 Nicotine dependence, unspecified, uncomplicated: Secondary | ICD-10-CM | POA: Diagnosis not present

## 2019-06-28 DIAGNOSIS — I2510 Atherosclerotic heart disease of native coronary artery without angina pectoris: Secondary | ICD-10-CM

## 2019-06-28 DIAGNOSIS — K219 Gastro-esophageal reflux disease without esophagitis: Secondary | ICD-10-CM | POA: Diagnosis not present

## 2019-06-29 LAB — COMPLETE METABOLIC PANEL WITH GFR
AG Ratio: 2 (calc) (ref 1.0–2.5)
ALT: 25 U/L (ref 6–29)
AST: 23 U/L (ref 10–35)
Albumin: 3.9 g/dL (ref 3.6–5.1)
Alkaline phosphatase (APISO): 86 U/L (ref 37–153)
BUN: 9 mg/dL (ref 7–25)
CO2: 27 mmol/L (ref 20–32)
Calcium: 9 mg/dL (ref 8.6–10.4)
Chloride: 107 mmol/L (ref 98–110)
Creat: 0.74 mg/dL (ref 0.50–1.05)
GFR, Est African American: 106 mL/min/{1.73_m2} (ref >=60)
GFR, Est Non African American: 92 mL/min/{1.73_m2} (ref >=60)
Globulin: 2 g/dL (calc) (ref 1.9–3.7)
Glucose, Bld: 107 mg/dL — ABNORMAL HIGH (ref 65–99)
Potassium: 4.4 mmol/L (ref 3.5–5.3)
Sodium: 144 mmol/L (ref 135–146)
Total Bilirubin: 0.5 mg/dL (ref 0.2–1.2)
Total Protein: 5.9 g/dL — ABNORMAL LOW (ref 6.1–8.1)

## 2019-06-29 LAB — LIPID PANEL
Cholesterol: 128 mg/dL (ref <200)
HDL: 45 mg/dL — ABNORMAL LOW (ref >=50)
LDL Cholesterol (Calc): 65 mg/dL (calc)
Non-HDL Cholesterol (Calc): 83 mg/dL (calc) (ref <130)
Total CHOL/HDL Ratio: 2.8 (calc) (ref <5.0)
Triglycerides: 95 mg/dL (ref <150)

## 2019-06-29 LAB — CBC WITH DIFFERENTIAL/PLATELET
Absolute Monocytes: 428 cells/uL (ref 200–950)
Basophils Absolute: 41 cells/uL (ref 0–200)
Basophils Relative: 0.6 %
Eosinophils Absolute: 177 cells/uL (ref 15–500)
Eosinophils Relative: 2.6 %
HCT: 42.9 % (ref 35.0–45.0)
Hemoglobin: 14.5 g/dL (ref 11.7–15.5)
Lymphs Abs: 2373 cells/uL (ref 850–3900)
MCH: 31.5 pg (ref 27.0–33.0)
MCHC: 33.8 g/dL (ref 32.0–36.0)
MCV: 93.1 fL (ref 80.0–100.0)
MPV: 11.6 fL (ref 7.5–12.5)
Monocytes Relative: 6.3 %
Neutro Abs: 3781 cells/uL (ref 1500–7800)
Neutrophils Relative %: 55.6 %
Platelets: 193 10*3/uL (ref 140–400)
RBC: 4.61 10*6/uL (ref 3.80–5.10)
RDW: 11.8 % (ref 11.0–15.0)
Total Lymphocyte: 34.9 %
WBC: 6.8 10*3/uL (ref 3.8–10.8)

## 2019-06-29 LAB — TSH: TSH: 0.86 mIU/L

## 2019-06-29 LAB — VITAMIN D 25 HYDROXY (VIT D DEFICIENCY, FRACTURES): Vit D, 25-Hydroxy: 40 ng/mL (ref 30–100)

## 2019-07-02 ENCOUNTER — Encounter: Payer: 59 | Admitting: Internal Medicine

## 2019-08-01 ENCOUNTER — Other Ambulatory Visit: Payer: Self-pay | Admitting: Cardiovascular Disease

## 2019-08-01 DIAGNOSIS — I2510 Atherosclerotic heart disease of native coronary artery without angina pectoris: Secondary | ICD-10-CM

## 2019-08-01 DIAGNOSIS — I10 Essential (primary) hypertension: Secondary | ICD-10-CM

## 2019-08-02 DIAGNOSIS — M2550 Pain in unspecified joint: Secondary | ICD-10-CM | POA: Diagnosis not present

## 2019-08-02 DIAGNOSIS — G894 Chronic pain syndrome: Secondary | ICD-10-CM | POA: Diagnosis not present

## 2019-08-02 DIAGNOSIS — M47817 Spondylosis without myelopathy or radiculopathy, lumbosacral region: Secondary | ICD-10-CM | POA: Diagnosis not present

## 2019-08-02 DIAGNOSIS — M542 Cervicalgia: Secondary | ICD-10-CM | POA: Diagnosis not present

## 2019-08-07 ENCOUNTER — Other Ambulatory Visit: Payer: Self-pay | Admitting: Internal Medicine

## 2019-08-07 DIAGNOSIS — Z13820 Encounter for screening for osteoporosis: Secondary | ICD-10-CM | POA: Diagnosis not present

## 2019-08-07 DIAGNOSIS — Z6833 Body mass index (BMI) 33.0-33.9, adult: Secondary | ICD-10-CM | POA: Diagnosis not present

## 2019-08-07 DIAGNOSIS — Z01419 Encounter for gynecological examination (general) (routine) without abnormal findings: Secondary | ICD-10-CM | POA: Diagnosis not present

## 2019-08-15 ENCOUNTER — Other Ambulatory Visit: Payer: Self-pay | Admitting: Internal Medicine

## 2019-08-20 DIAGNOSIS — L821 Other seborrheic keratosis: Secondary | ICD-10-CM | POA: Diagnosis not present

## 2019-08-20 DIAGNOSIS — C44319 Basal cell carcinoma of skin of other parts of face: Secondary | ICD-10-CM | POA: Diagnosis not present

## 2019-08-20 DIAGNOSIS — D225 Melanocytic nevi of trunk: Secondary | ICD-10-CM | POA: Diagnosis not present

## 2019-08-22 ENCOUNTER — Ambulatory Visit (INDEPENDENT_AMBULATORY_CARE_PROVIDER_SITE_OTHER): Payer: 59 | Admitting: Internal Medicine

## 2019-08-22 ENCOUNTER — Encounter: Payer: Self-pay | Admitting: Internal Medicine

## 2019-08-22 ENCOUNTER — Other Ambulatory Visit: Payer: Self-pay

## 2019-08-22 VITALS — BP 120/80 | HR 74 | Temp 98.2°F | Ht 65.0 in | Wt 201.0 lb

## 2019-08-22 DIAGNOSIS — K219 Gastro-esophageal reflux disease without esophagitis: Secondary | ICD-10-CM

## 2019-08-22 DIAGNOSIS — F439 Reaction to severe stress, unspecified: Secondary | ICD-10-CM

## 2019-08-22 DIAGNOSIS — Z0000 Encounter for general adult medical examination without abnormal findings: Secondary | ICD-10-CM

## 2019-08-22 DIAGNOSIS — R7302 Impaired glucose tolerance (oral): Secondary | ICD-10-CM | POA: Diagnosis not present

## 2019-08-22 DIAGNOSIS — G4733 Obstructive sleep apnea (adult) (pediatric): Secondary | ICD-10-CM

## 2019-08-22 DIAGNOSIS — I252 Old myocardial infarction: Secondary | ICD-10-CM | POA: Diagnosis not present

## 2019-08-22 DIAGNOSIS — R002 Palpitations: Secondary | ICD-10-CM | POA: Diagnosis not present

## 2019-08-22 DIAGNOSIS — F17200 Nicotine dependence, unspecified, uncomplicated: Secondary | ICD-10-CM

## 2019-08-22 DIAGNOSIS — M7918 Myalgia, other site: Secondary | ICD-10-CM

## 2019-08-22 DIAGNOSIS — E039 Hypothyroidism, unspecified: Secondary | ICD-10-CM | POA: Diagnosis not present

## 2019-08-22 DIAGNOSIS — F419 Anxiety disorder, unspecified: Secondary | ICD-10-CM | POA: Diagnosis not present

## 2019-08-22 DIAGNOSIS — K635 Polyp of colon: Secondary | ICD-10-CM

## 2019-08-22 DIAGNOSIS — E8881 Metabolic syndrome: Secondary | ICD-10-CM

## 2019-08-22 DIAGNOSIS — I10 Essential (primary) hypertension: Secondary | ICD-10-CM

## 2019-08-22 DIAGNOSIS — F32A Depression, unspecified: Secondary | ICD-10-CM

## 2019-08-22 DIAGNOSIS — Z8679 Personal history of other diseases of the circulatory system: Secondary | ICD-10-CM | POA: Diagnosis not present

## 2019-08-22 DIAGNOSIS — Z6833 Body mass index (BMI) 33.0-33.9, adult: Secondary | ICD-10-CM

## 2019-08-22 DIAGNOSIS — G8929 Other chronic pain: Secondary | ICD-10-CM

## 2019-08-22 DIAGNOSIS — F329 Major depressive disorder, single episode, unspecified: Secondary | ICD-10-CM

## 2019-08-22 LAB — POCT URINALYSIS DIPSTICK
Appearance: NEGATIVE
Bilirubin, UA: NEGATIVE
Blood, UA: NEGATIVE
Glucose, UA: NEGATIVE
Ketones, UA: NEGATIVE
Leukocytes, UA: NEGATIVE
Nitrite, UA: NEGATIVE
Odor: NEGATIVE
Protein, UA: NEGATIVE
Spec Grav, UA: 1.01 (ref 1.010–1.025)
Urobilinogen, UA: 0.2 E.U./dL
pH, UA: 6.5 (ref 5.0–8.0)

## 2019-08-22 NOTE — Patient Instructions (Signed)
Please consider Dr. Migdalia Dk clinic for weight loss.  Referral made to pulmonary to assess possible COPD in consider CT for lung cancer screening.  Also evaluate sleep apnea there.  To get flu vaccine through employment.  Recommend diet exercise and weight loss.  Continue current medications and follow-up in 6 months.

## 2019-08-22 NOTE — Progress Notes (Signed)
Subjective:    Patient ID: [name], female    DOB: 04/26/1966, 53 y.o.   MRN: [id number]  HPI 53 year old Female  in for health maintenance exam and evaluation of medical issues.  Unfortunately she continues to smoke despite history of heart disease.  She has not been able to quit.  She is concerned about lung cancer.  She would be a candidate for CT scan screening.  She would like to see pulmonologist once again.  She does have mild COPD and has bronchitis from time totime from time to time issues with palpitations.  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  She had subtotal occlusion of a very small branch of the diagonal and no other significant disease.  She underwent cardiac catheterization and did well.  She had a very serious case of at [facility] purpura in October 2006.  Hemoglobin dropped from 9.4 g.  She developed purpuric lesions on her lower extremities and maroon colored stools.  CT scan showed diffuse colitis.  She was transferred to [facility] after developing tea colored urine with hematuria.  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  She was treated with high-dose steroids which she remained on for several months.  She was treated by Dr. Marlana Salvage at [facility] in the rheumatology department.  She has never had a recurrence.  In September 2009, she was hospitalized for gastroenteritis secondary to C. difficile infection.  In 2009 she had a normal cardiac nuclear study.  In 2006 she had a 2D echocardiogram that was normal.  History of sleep apnea evaluated by Dr. Annamaria Boots showing mild obstructive sleep apnea with moderate snoring.  Weight loss was recommended.  CPAP was suggested for at least supplemental oxygen.  History of migraine headaches.  History of cervical radiculopathy 2012.  History of an osteophyte causing some mild foraminal stenosis and impingement on MRI which is been evaluated by Dr. Vertell Limber.  For some time she was maintained on  Adderall for attention deficit disorder but this was discontinued after her MRI.  History of endometrial polyp causing menorrhagia removed by Dr. Theda Sers November 2003.  She had C-sections in [place].  Appendectomy in early 1070's.  Chronic fibromyalgia type pain and is seen at [facility] pain management.  Is maintained on small dose narcotic medication.  No history of fractures.  Social history: She is married.  Has 3 adult sons.  Does not consume alcohol.  Has smoked for well over 20 years.  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  Family history: Mother passed away from acute respiratory arrest.  Both parents with history of hypertension.  Father with history of stroke resides alone.  2 brothers in good health.  No sisters.   Has from time to time issues with palpitations that they have been evaluated by Cardiology.  She is on thyroid replacement medication but TSH is normal.  She has impaired glucose tolerance.  She is not on a beta-blocker.  She has discussed palpitations with cardiology.  She has had Holter monitoring 2018 showing single PACs and PVCs with no sustained arrhythmia.  She is not on beta-blocker.  It sounds like it is fairly infrequent and is self terminating.  Will receive flu vaccine through employment. She recently saw GYN physician and had normal Pap and bone density study.  Copy placed in Epic under media  Review of Systems  Respiratory:  History of mild sleep apnea  Cardiovascular: Negative.        Occasional palpitations not prolonged  Genitourinary: Negative.   Musculoskeletal: Positive for arthralgias and myalgias.  Neurological:       History of migraine headaches  Psychiatric/Behavioral:       Anxiety related to work stress       Objective:   Physical Exam Constitutional:      General: She is not in acute distress.    Appearance: Normal appearance.  HENT:     Head: Normocephalic.     Right Ear: Tympanic membrane normal.      Ears:     Comments: Wax in left external ear canal    Nose: Nose normal.     Mouth/Throat:     Mouth: Mucous membranes are moist.     Pharynx: Oropharynx is clear.  Eyes:     General: No scleral icterus.       Right eye: No discharge.        Left eye: No discharge.     Conjunctiva/sclera: Conjunctivae normal.  Neck:     Musculoskeletal: Neck supple. No neck rigidity.     Comments: No carotid bruits Cardiovascular:     Rate and Rhythm: Normal rate and regular rhythm.     Heart sounds: Normal heart sounds. No murmur.  Pulmonary:     Effort: Pulmonary effort is normal.     Breath sounds: Normal breath sounds. No wheezing.     Comments: No thyromegaly.  Breast without masses. Abdominal:     General: Bowel sounds are normal. There is no distension.     Palpations: Abdomen is soft. There is no mass.     Tenderness: There is no abdominal tenderness. There is no guarding or rebound.  Genitourinary:    Comments: Deferred to GYN Musculoskeletal:     Right lower leg: No edema.     Left lower leg: No edema.  Lymphadenopathy:     Cervical: No cervical adenopathy.  Skin:    General: Skin is warm and dry.     Findings: No rash.  Neurological:     General: No focal deficit present.     Mental Status: She is alert and oriented to person, place, and time.     Cranial Nerves: No cranial nerve deficit.     Sensory: No sensory deficit.     Motor: No weakness.     Coordination: Coordination normal.  Psychiatric:        Mood and Affect: Mood normal.        Behavior: Behavior normal.        Thought Content: Thought content normal.        Judgment: Judgment normal.    BP 120/80, pulse 74 regular, temperature 98.2 degrees orally pulse oximetry 98% weight 201 pounds BMI 33.45       Assessment & Plan:  History of smoking-refer to pulmonologist regarding sleep apnea history and smoking history.  May be a candidate for screening CT for lung cancer.  Assess COPD.  Health maintenance-to  have flu vaccine through employment.  Has had mammogram and bone density study through GYN.  BMI 33.45-may be a candidate for Dr. Migdalia Dk clinic.  Needs to diet exercise and lose weight.  Exercise is hard for her with her chronic musculoskeletal pain.  Chronic musculoskeletal pain seen by pain management physician-stable  Anxiety and depression-situational stress at work- continue current medication  Heart disease-continue follow-up with [facility].  Status post anterior MI  December 2013 followed by Dr. Burt Knack and maintained on statin.  Hyperplastic colon polyps on colonoscopy 2016  Palpitations- disconcerting to her but have been evaluated by Holter monitor and they are not sustained  Remote history of HS purpura-with no recurrence  Essential hypertension-stable on current regimen  Hypothyroidism-stable on current dose of thyroid replacement therapy  GE reflux-stable with PPI  Impaired glucose tolerance-stable hemoglobin A1c done in January at 5.9%.  This was overlooked with recent labs in July.  Recent fasting glucose was 109.  Follow-up in 6 months.  Return in 6 months we will need lipid panel, liver functions, hemoglobin A1c and office visit.

## 2019-09-06 ENCOUNTER — Other Ambulatory Visit: Payer: Self-pay | Admitting: Internal Medicine

## 2019-09-06 DIAGNOSIS — I10 Essential (primary) hypertension: Secondary | ICD-10-CM

## 2019-09-06 DIAGNOSIS — I2510 Atherosclerotic heart disease of native coronary artery without angina pectoris: Secondary | ICD-10-CM

## 2019-09-10 DIAGNOSIS — Z85828 Personal history of other malignant neoplasm of skin: Secondary | ICD-10-CM | POA: Diagnosis not present

## 2019-09-10 DIAGNOSIS — C44319 Basal cell carcinoma of skin of other parts of face: Secondary | ICD-10-CM | POA: Diagnosis not present

## 2019-09-20 ENCOUNTER — Ambulatory Visit: Payer: 59 | Admitting: Pulmonary Disease

## 2019-09-20 ENCOUNTER — Ambulatory Visit (INDEPENDENT_AMBULATORY_CARE_PROVIDER_SITE_OTHER): Payer: 59

## 2019-09-20 ENCOUNTER — Encounter: Payer: Self-pay | Admitting: Pulmonary Disease

## 2019-09-20 ENCOUNTER — Other Ambulatory Visit: Payer: Self-pay

## 2019-09-20 VITALS — BP 114/68 | HR 80 | Temp 97.6°F | Ht 65.0 in | Wt 199.8 lb

## 2019-09-20 DIAGNOSIS — J42 Unspecified chronic bronchitis: Secondary | ICD-10-CM

## 2019-09-20 DIAGNOSIS — R0602 Shortness of breath: Secondary | ICD-10-CM | POA: Diagnosis not present

## 2019-09-20 NOTE — Progress Notes (Signed)
[name]    [id number]    1966/04/11  Primary Care Physician:Baxley, Cresenciano Lick, MD  Referring Physician: Elby Showers, MD [address] [place],  [place] [address]  Chief complaint: Consult for COPD, OSA  HPI: 53 year old active smoker referred for evaluation of COPD, OSA She was seen in pulmonary clinic in 2017 by Dr. Lake Bells.  PFTs at that time did not show significant obstruction.  She was also evaluated for OSA with sleep apnea showing mild disease and mild desaturation  States that she continues to smoke half pack per day.  Has some atypical left chest pain.  She does have history of coronary artery disease and is following with Dr. Burt Knack, cardiology.  Denies any cough, sputum production, dyspnea, wheezing.  Pets: Has a cat, no dogs, birds, farm Occupation: Works as a Marine scientist for [employer] Exposures: No known exposures.  No mold, hot tub, Jacuzzi Smoking history: 15 to 30 pack year smoker.  Continues to smoke half pack per day Travel history: No significant travel history Relevant family history: No significant family history of lung disease   Outpatient Encounter Medications as of 09/20/2019  Medication Sig  . ALPRAZolam (XANAX) 0.5 MG tablet Take 1 tablet (0.5 mg total) by mouth 2 (two) times daily as needed for anxiety.  Marland Kitchen amLODipine (NORVASC) 5 MG tablet TAKE 1 TABLET (5 MG TOTAL) BY MOUTH DAILY.  Marland Kitchen aspirin EC 81 MG EC tablet Take 1 tablet (81 mg total) by mouth daily.  Marland Kitchen atorvastatin (LIPITOR) 40 MG tablet TAKE 1 TABLET BY MOUTH DAILY AT 6:00 PM  . calcium-vitamin D (OSCAL WITH D) 500-200 MG-UNIT per tablet Take 1 tablet by mouth daily.  . Cholecalciferol (VITAMIN D3) 2000 units TABS Take 2,000 Units by mouth daily.  Marland Kitchen desvenlafaxine (PRISTIQ) 50 MG 24 hr tablet TAKE 1 TABLET (50 MG TOTAL) BY MOUTH DAILY.  Marland Kitchen enalapril (VASOTEC) 10 MG tablet TAKE 1 TABLET (10 MG TOTAL) BY MOUTH DAILY.  Marland Kitchen HYDROcodone-acetaminophen (NORCO/VICODIN)  5-325 MG tablet Take 1 tablet by mouth every 6 (six) hours as needed for moderate pain.  Marland Kitchen levothyroxine (SYNTHROID) 75 MCG tablet TAKE 1 TABLET (75 MCG TOTAL) BY MOUTH DAILY.  Marland Kitchen loratadine (CLARITIN) 10 MG tablet Take 10 mg by mouth daily.  . nitroGLYCERIN (NITROSTAT) 0.4 MG SL tablet Place 1 tablet (0.4 mg total) under the tongue every 5 (five) minutes as needed for chest pain.  . pantoprazole (PROTONIX) 40 MG tablet TAKE 1 TABLET BY MOUTH TWICE A DAY BEFORE A MEAL.   No facility-administered encounter medications on file as of 09/20/2019.     Allergies as of 09/20/2019 - Review Complete 09/20/2019  Allergen Reaction Noted  . Levaquin [levofloxacin] Hives 11/06/2012  . Nsaids Other (See Comments) 11/22/2012    Past Medical History:  Diagnosis Date  . ADHD (attention deficit hyperactivity disorder)   . Anxiety   . Asthma   . CAD (coronary artery disease)    a. 11/2012 Acute Anterior STEMI/Cath: Nonobs dzs except subtotal occlusion of small, 50mm D2, EF 55-65%;  b. 11/2012  Echo: EF 60-65%, nl wall motion, Gr 2 DD.  Marland Kitchen Chronic kidney disease    in setting of HSP 2006  . Coronary vasospasm (HCC)    a. presumed  . Depression   . GERD (gastroesophageal reflux disease)   . 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 Henoch-Schonlein purpura (HCC)    HSP  tx at [facility] in 2006 (also h/o leukocytoclastic vasculitis per chart 2006)  . Hypertension    Controlled - she states she is on BP meds for HSP  . Hypothyroidism   . Migraines   . Sleep apnea    "lost 50#; no problems w/it now" (11/22/2012)  . 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 Tobacco abuse     Past Surgical History:  Procedure Laterality Date  . APPENDECTOMY  1970's  . CARDIAC CATHETERIZATION  11/22/2012   "first one" (11/22/2012)  . [place]; 1996; 1999  . DILATION AND CURETTAGE OF UTERUS  1990's  . LEFT HEART  CATHETERIZATION WITH CORONARY ANGIOGRAM N/A 11/22/2012   Procedure: LEFT HEART CATHETERIZATION WITH CORONARY ANGIOGRAM;  Surgeon: Sherren Mocha, MD;  Location: [facility] CATH LAB;  Service: Cardiovascular;  Laterality: N/A;    Family History  Problem Relation Age of Onset  . Stroke Father   . Hypertension Mother   . Heart disease Neg Hx     Social History   Socioeconomic History  . Marital status: Married    Spouse name: Not on file  . Number of children: Not on file  . Years of education: Not on file  . Highest education level: Not on file  Occupational History  . Occupation: RN/UC    Employer: [employer]    Comment: [employer]  Social Needs  . Financial resource strain: Not on file  . Food insecurity    Worry: Not on file    Inability: Not on file  . Transportation needs    Medical: Not on file    Non-medical: Not on file  Tobacco Use  . Smoking status: Current Every Day Smoker    Packs/day: 0.50    Years: 25.00    Pack years: 12.50    Types: Cigarettes  . Smokeless tobacco: Never Used  Substance and Sexual Activity  . Alcohol use: No  . Drug use: No  . Sexual activity: Yes  Lifestyle  . Physical activity    Days per week: Not on file    Minutes per session: Not on file  . Stress: Not on file  Relationships  . Social Herbalist on phone: Not on file    Gets together: Not on file    Attends religious service: Not on file    Active member of club or organization: Not on file    Attends meetings of clubs or organizations: Not on file    Relationship status: Not on file  . Intimate partner violence    Fear of current or ex partner: Not on file    Emotionally abused: Not on file    Physically abused: Not on file    Forced sexual activity: Not on file  Other Topics Concern  . Not on file  Social History Narrative  . Not on file    Review of systems: Review of Systems  Constitutional: Negative for fever and chills.  HENT: Negative.    Eyes: Negative for blurred vision.  Respiratory: as per HPI  Cardiovascular: Negative for chest pain and palpitations.  Gastrointestinal: Negative for vomiting, diarrhea, blood per rectum. Genitourinary: Negative for dysuria, urgency, frequency and hematuria.  Musculoskeletal: Negative for myalgias, back pain and joint pain.  Skin: Negative for itching and rash.  Neurological: Negative for dizziness, tremors, focal weakness, seizures and loss of consciousness.  Endo/Heme/Allergies: Negative for  environmental allergies.  Psychiatric/Behavioral: Negative for depression, suicidal ideas and hallucinations.  All other systems reviewed and are negative.  Physical Exam: Blood pressure 114/68, pulse 80, temperature 97.6 F (36.4 C), temperature source Temporal, height 5\' 5"  (1.651 m), weight 199 lb 12.8 oz (90.6 kg), SpO2 98 %. Gen:      No acute distress HEENT:  EOMI, sclera anicteric Neck:     No masses; no thyromegaly Lungs:    Clear to auscultation bilaterally; normal respiratory effort CV:         Regular rate and rhythm; no murmurs Abd:      + bowel sounds; soft, non-tender; no palpable masses, no distension Ext:    No edema; adequate peripheral perfusion Skin:      Warm and dry; no rash Neuro: alert and oriented x 3 Psych: normal mood and affect  Data Reviewed: Imaging: Chest x-ray 05/11/2018-no acute cardiopulmonary abnormality.  I have reviewed the images personally.  PFTs: 08/31/2016 FVC 2.7 [74%], FEV1 1.9 [68%], F/F 72 Restriction possible.  Labs: CBC 08/29/2019-WBC 6.8, eos 2.6%, absolute eosinophil count 177  Sleep PSG 06/19/2017- AHI 6.3 indicating mild sleep apnea.  Mild oxygen desaturation to 84%.  Assessment:  COPD PFTs in 2017 with no overt obstruction but she likely has small airways disease. Currently asymptomatic.  Will observe off inhalers  Active smoking Smoking cessation strongly encouraged.  Offered use of Chantix, nicotine but she wants to quit on her own  Reassess at next visit.  Time spent counseling- 5 minutes We will get a chest x-ray today.  She does not meet the criteria yet for low-dose screening CTs given age and pack-year smoking history  OSA Prior sleep test reviewed with minimal sleep apnea and mild desaturation. Does not need therapy at present.  Continue to monitor symptoms.  Plan/Recommendations: - Smoking cessation - Chest x-ray  Marshell Garfinkel MD [facility] 09/20/2019, 10:53 AM  CC: Elby Showers, MD

## 2019-09-20 NOTE — Patient Instructions (Signed)
We will schedule you for a chest x-ray Continue to work on smoking cessation Follow-up in 1 year

## 2019-09-27 DIAGNOSIS — M47817 Spondylosis without myelopathy or radiculopathy, lumbosacral region: Secondary | ICD-10-CM | POA: Diagnosis not present

## 2019-09-27 DIAGNOSIS — M2550 Pain in unspecified joint: Secondary | ICD-10-CM | POA: Diagnosis not present

## 2019-09-27 DIAGNOSIS — G894 Chronic pain syndrome: Secondary | ICD-10-CM | POA: Diagnosis not present

## 2019-09-27 DIAGNOSIS — M542 Cervicalgia: Secondary | ICD-10-CM | POA: Diagnosis not present

## 2019-10-29 ENCOUNTER — Other Ambulatory Visit: Payer: Self-pay | Admitting: Internal Medicine

## 2019-10-30 ENCOUNTER — Other Ambulatory Visit: Payer: Self-pay | Admitting: Pulmonary Disease

## 2019-10-30 ENCOUNTER — Telehealth: Payer: Self-pay | Admitting: Internal Medicine

## 2019-10-30 MED ORDER — ALBUTEROL SULFATE HFA 108 (90 BASE) MCG/ACT IN AERS: 2.0000 | INHALATION_SPRAY | Freq: Four times a day (QID) | RESPIRATORY_TRACT | 11 refills | Status: DC | PRN

## 2019-10-30 NOTE — Telephone Encounter (Signed)
Dr. Vaughan Browner pt requesting refill of rescue inhaler. Per last ov you were going to observe off inhalers. Yesterday patient had her pcp call in [facility] 160.  Called patient to see why she is requesting pt states she was not aware of trial off inhaler. Explained we just need documentation of why she needed inhaler. Pt says she doesn't but likes to keep on hand. She says disregard request. I asked if she was sure b/c I could send request to PM. Pt says she doesn't need it. Request denied. Informed patient that if in the future she is having trouble with breathing to please call and request inhaler. Nothing further needed at this time.        Assessment:  COPD PFTs in 2017 with no overt obstruction but she likely has small airways disease. Currently asymptomatic.  Will observe off inhalers  Active smoking Smoking cessation strongly encouraged.  Offered use of Chantix, nicotine but she wants to quit on her own Reassess at next visit.  Time spent counseling- 5 minutes We will get a chest x-ray today.  She does not meet the criteria yet for low-dose screening CTs given age and pack-year smoking history  OSA Prior sleep test reviewed with minimal sleep apnea and mild desaturation. Does not need therapy at present.  Continue to monitor symptoms.  Plan/Recommendations: - Smoking cessation - Chest x-ray  Marshell Garfinkel MD [facility] 09/20/2019, 10:53 AM  CC: Elby Showers, MD

## 2019-10-30 NOTE — Telephone Encounter (Signed)
Received Fax RX request from  [facility]  Medication - Ventolin HFA 90 MCG Inhaler   Last Refill - 01/10/19  Last OV - 08/22/19  Last CPE - 08/22/19  Next Appointment -

## 2019-10-30 NOTE — Telephone Encounter (Signed)
Please refill for one year  

## 2019-11-22 DIAGNOSIS — M2550 Pain in unspecified joint: Secondary | ICD-10-CM | POA: Diagnosis not present

## 2019-11-22 DIAGNOSIS — M47817 Spondylosis without myelopathy or radiculopathy, lumbosacral region: Secondary | ICD-10-CM | POA: Diagnosis not present

## 2019-11-22 DIAGNOSIS — G894 Chronic pain syndrome: Secondary | ICD-10-CM | POA: Diagnosis not present

## 2019-11-22 DIAGNOSIS — M542 Cervicalgia: Secondary | ICD-10-CM | POA: Diagnosis not present

## 2019-12-04 ENCOUNTER — Other Ambulatory Visit: Payer: Self-pay | Admitting: Internal Medicine

## 2019-12-04 DIAGNOSIS — I2510 Atherosclerotic heart disease of native coronary artery without angina pectoris: Secondary | ICD-10-CM

## 2019-12-04 DIAGNOSIS — I10 Essential (primary) hypertension: Secondary | ICD-10-CM

## 2019-12-11 ENCOUNTER — Other Ambulatory Visit: Payer: Self-pay | Admitting: Internal Medicine

## 2020-01-15 DIAGNOSIS — M47817 Spondylosis without myelopathy or radiculopathy, lumbosacral region: Secondary | ICD-10-CM | POA: Diagnosis not present

## 2020-01-15 DIAGNOSIS — M2550 Pain in unspecified joint: Secondary | ICD-10-CM | POA: Diagnosis not present

## 2020-01-15 DIAGNOSIS — G894 Chronic pain syndrome: Secondary | ICD-10-CM | POA: Diagnosis not present

## 2020-01-15 DIAGNOSIS — M542 Cervicalgia: Secondary | ICD-10-CM | POA: Diagnosis not present

## 2020-01-31 DIAGNOSIS — H43821 Vitreomacular adhesion, right eye: Secondary | ICD-10-CM | POA: Diagnosis not present

## 2020-01-31 DIAGNOSIS — H40013 Open angle with borderline findings, low risk, bilateral: Secondary | ICD-10-CM | POA: Diagnosis not present

## 2020-01-31 DIAGNOSIS — H04123 Dry eye syndrome of bilateral lacrimal glands: Secondary | ICD-10-CM | POA: Diagnosis not present

## 2020-02-04 DIAGNOSIS — G5603 Carpal tunnel syndrome, bilateral upper limbs: Secondary | ICD-10-CM | POA: Diagnosis not present

## 2020-02-19 ENCOUNTER — Other Ambulatory Visit: Payer: 59 | Admitting: Internal Medicine

## 2020-02-19 ENCOUNTER — Other Ambulatory Visit: Payer: Self-pay

## 2020-02-19 DIAGNOSIS — R7302 Impaired glucose tolerance (oral): Secondary | ICD-10-CM | POA: Diagnosis not present

## 2020-02-19 DIAGNOSIS — E7849 Other hyperlipidemia: Secondary | ICD-10-CM | POA: Diagnosis not present

## 2020-02-20 LAB — HEPATIC FUNCTION PANEL
AG Ratio: 2 (calc) (ref 1.0–2.5)
ALT: 26 U/L (ref 6–29)
AST: 22 U/L (ref 10–35)
Albumin: 3.8 g/dL (ref 3.6–5.1)
Alkaline phosphatase (APISO): 89 U/L (ref 37–153)
Bilirubin, Direct: 0.1 mg/dL (ref 0.0–0.2)
Globulin: 1.9 g/dL (calc) (ref 1.9–3.7)
Indirect Bilirubin: 0.2 mg/dL (calc) (ref 0.2–1.2)
Total Bilirubin: 0.3 mg/dL (ref 0.2–1.2)
Total Protein: 5.7 g/dL — ABNORMAL LOW (ref 6.1–8.1)

## 2020-02-20 LAB — HEMOGLOBIN A1C
Hgb A1c MFr Bld: 5.8 % of total Hgb — ABNORMAL HIGH (ref <5.7)
Mean Plasma Glucose: 120 (calc)
eAG (mmol/L): 6.6 (calc)

## 2020-02-20 LAB — LIPID PANEL
Cholesterol: 131 mg/dL (ref <200)
HDL: 56 mg/dL (ref >=50)
LDL Cholesterol (Calc): 59 mg/dL (calc)
Non-HDL Cholesterol (Calc): 75 mg/dL (calc) (ref <130)
Total CHOL/HDL Ratio: 2.3 (calc) (ref <5.0)
Triglycerides: 82 mg/dL (ref <150)

## 2020-02-21 ENCOUNTER — Other Ambulatory Visit: Payer: Self-pay

## 2020-02-21 ENCOUNTER — Other Ambulatory Visit: Payer: Self-pay | Admitting: Internal Medicine

## 2020-02-21 ENCOUNTER — Encounter: Payer: Self-pay | Admitting: Internal Medicine

## 2020-02-21 ENCOUNTER — Ambulatory Visit: Payer: 59 | Admitting: Internal Medicine

## 2020-02-21 VITALS — BP 120/80 | HR 78 | Temp 97.9°F | Ht 65.0 in | Wt 202.0 lb

## 2020-02-21 DIAGNOSIS — E8881 Metabolic syndrome: Secondary | ICD-10-CM

## 2020-02-21 DIAGNOSIS — F419 Anxiety disorder, unspecified: Secondary | ICD-10-CM | POA: Diagnosis not present

## 2020-02-21 DIAGNOSIS — F32A Depression, unspecified: Secondary | ICD-10-CM

## 2020-02-21 DIAGNOSIS — G8929 Other chronic pain: Secondary | ICD-10-CM

## 2020-02-21 DIAGNOSIS — G4733 Obstructive sleep apnea (adult) (pediatric): Secondary | ICD-10-CM

## 2020-02-21 DIAGNOSIS — Z8679 Personal history of other diseases of the circulatory system: Secondary | ICD-10-CM

## 2020-02-21 DIAGNOSIS — M7918 Myalgia, other site: Secondary | ICD-10-CM

## 2020-02-21 DIAGNOSIS — I252 Old myocardial infarction: Secondary | ICD-10-CM | POA: Diagnosis not present

## 2020-02-21 DIAGNOSIS — F439 Reaction to severe stress, unspecified: Secondary | ICD-10-CM

## 2020-02-21 DIAGNOSIS — R7302 Impaired glucose tolerance (oral): Secondary | ICD-10-CM

## 2020-02-21 DIAGNOSIS — I10 Essential (primary) hypertension: Secondary | ICD-10-CM

## 2020-02-21 DIAGNOSIS — Z6833 Body mass index (BMI) 33.0-33.9, adult: Secondary | ICD-10-CM

## 2020-02-21 DIAGNOSIS — F329 Major depressive disorder, single episode, unspecified: Secondary | ICD-10-CM

## 2020-02-21 DIAGNOSIS — F17200 Nicotine dependence, unspecified, uncomplicated: Secondary | ICD-10-CM

## 2020-02-21 DIAGNOSIS — E039 Hypothyroidism, unspecified: Secondary | ICD-10-CM | POA: Diagnosis not present

## 2020-02-21 MED ORDER — ALBUTEROL SULFATE HFA 108 (90 BASE) MCG/ACT IN AERS: 2.0000 | INHALATION_SPRAY | Freq: Four times a day (QID) | RESPIRATORY_TRACT | 11 refills | Status: DC | PRN

## 2020-02-21 NOTE — Progress Notes (Signed)
   Subjective:    Patient ID: [name], female    DOB: [birth year], 54 y.o.   MRN: [id number]  HPI  54 year old Female for 6 month recheck with history of MI in 2013.  She continues to smoke despite history of heart disease.  History of sleep apnea seen by Dr. Annamaria Boots diagnosed with mild obstructive sleep apnea with moderate snoring.  Weight loss recommended.  CPAP suggested.  History of migraine headaches.  Chronic musculoskeletal pain seen in chronic pain management.  Has fibromyalgia type pain.  Situational stress at work discussed.  History of impaired glucose tolerance.  History of palpitations but is not on beta-blocker.  These are infrequent.  Lipid panel is normal on Lipitor 40 mg daily.  4 depression she is maintained on Pristiq 50 mg daily.  For anxiety she is maintained on Xanax 0.5 mg twice daily.  She has Ventolin inhaler on hand for wheezing.  History of hypothyroidism treated with levothyroxine 0.075 mg daily.  TSH in July was normal.  Regarding impaired glucose tolerance hemoglobin A1c is 5.8%.  This is managed by diet.  History of GE reflux treated with Protonix 40 mg daily.  Her hypertension is maintained on Vasotec 10 mg daily and Norvasc 5 mg daily.    Review of Systems see above     Objective:   Physical Exam  Vital signs reviewed.  Skin warm and dry.  No cervical adenopathy.  No thyromegaly.  No carotid bruits.  Chest clear to auscultation.  Cardiac exam regular rate and rhythm normal S1 and S2 without murmurs or gallops.  No lower extremity edema.      Assessment & Plan:  Impaired glucose tolerance-hemoglobin A1c stable at 5.8% treated with diet alone  Essential hypertension-continue Vasotec and Norvasc.  Blood pressure excellent.  History of H-S purpura in the remote past  GE reflux treated with Protonix  Situational stress at work  History of heart disease- no chest pain  History of smoking-needs to quit  History of  anxiety and depression which are stable with Xanax and antidepressant  History of reactive airways disease/COPD  Elevated BMI-33.61.  I recommended Dr. Migdalia Dk clinic in the past.  Plan: Encourage diet exercise weight loss and smoking cessation.  Return in 6 months.

## 2020-03-05 NOTE — Patient Instructions (Signed)
Continue diet exercise and weight loss efforts.  Please quit smoking.  Consider Dr. Migdalia Dk clinic for weight loss.  Return in 6 months.

## 2020-03-06 ENCOUNTER — Other Ambulatory Visit: Payer: Self-pay | Admitting: Internal Medicine

## 2020-03-11 DIAGNOSIS — M47817 Spondylosis without myelopathy or radiculopathy, lumbosacral region: Secondary | ICD-10-CM | POA: Diagnosis not present

## 2020-03-11 DIAGNOSIS — G894 Chronic pain syndrome: Secondary | ICD-10-CM | POA: Diagnosis not present

## 2020-03-11 DIAGNOSIS — M2550 Pain in unspecified joint: Secondary | ICD-10-CM | POA: Diagnosis not present

## 2020-03-11 DIAGNOSIS — M542 Cervicalgia: Secondary | ICD-10-CM | POA: Diagnosis not present

## 2020-04-28 ENCOUNTER — Other Ambulatory Visit: Payer: Self-pay | Admitting: Cardiovascular Disease

## 2020-04-28 DIAGNOSIS — I10 Essential (primary) hypertension: Secondary | ICD-10-CM

## 2020-04-28 DIAGNOSIS — I2510 Atherosclerotic heart disease of native coronary artery without angina pectoris: Secondary | ICD-10-CM

## 2020-05-06 ENCOUNTER — Other Ambulatory Visit: Payer: Self-pay | Admitting: Internal Medicine

## 2020-05-06 DIAGNOSIS — M542 Cervicalgia: Secondary | ICD-10-CM | POA: Diagnosis not present

## 2020-05-06 DIAGNOSIS — G894 Chronic pain syndrome: Secondary | ICD-10-CM | POA: Diagnosis not present

## 2020-05-06 DIAGNOSIS — M2550 Pain in unspecified joint: Secondary | ICD-10-CM | POA: Diagnosis not present

## 2020-05-06 DIAGNOSIS — M47817 Spondylosis without myelopathy or radiculopathy, lumbosacral region: Secondary | ICD-10-CM | POA: Diagnosis not present

## 2020-05-08 ENCOUNTER — Other Ambulatory Visit: Payer: Self-pay | Admitting: Physical Medicine and Rehabilitation

## 2020-05-08 ENCOUNTER — Ambulatory Visit
Admission: RE | Admit: 2020-05-08 | Discharge: 2020-05-08 | Disposition: A | Discharge status: Home / Self Care | Payer: 59 | Source: Ambulatory Visit | Attending: Physical Medicine and Rehabilitation | Admitting: Physical Medicine and Rehabilitation

## 2020-05-08 ENCOUNTER — Other Ambulatory Visit: Payer: Self-pay | Admitting: Anesthesiology

## 2020-05-08 ENCOUNTER — Other Ambulatory Visit: Payer: Self-pay

## 2020-05-08 DIAGNOSIS — M542 Cervicalgia: Secondary | ICD-10-CM

## 2020-05-08 DIAGNOSIS — M47812 Spondylosis without myelopathy or radiculopathy, cervical region: Secondary | ICD-10-CM | POA: Diagnosis not present

## 2020-05-19 ENCOUNTER — Other Ambulatory Visit: Payer: Self-pay | Admitting: Internal Medicine

## 2020-05-19 DIAGNOSIS — Z1231 Encounter for screening mammogram for malignant neoplasm of breast: Secondary | ICD-10-CM

## 2020-06-04 ENCOUNTER — Other Ambulatory Visit: Payer: Self-pay

## 2020-06-04 ENCOUNTER — Ambulatory Visit
Admission: RE | Admit: 2020-06-04 | Discharge: 2020-06-04 | Disposition: A | Discharge status: Home / Self Care | Payer: 59 | Source: Ambulatory Visit | Attending: Internal Medicine | Admitting: Internal Medicine

## 2020-06-04 DIAGNOSIS — Z1231 Encounter for screening mammogram for malignant neoplasm of breast: Secondary | ICD-10-CM | POA: Diagnosis not present

## 2020-07-10 DIAGNOSIS — M47817 Spondylosis without myelopathy or radiculopathy, lumbosacral region: Secondary | ICD-10-CM | POA: Diagnosis not present

## 2020-07-10 DIAGNOSIS — M542 Cervicalgia: Secondary | ICD-10-CM | POA: Diagnosis not present

## 2020-07-10 DIAGNOSIS — G894 Chronic pain syndrome: Secondary | ICD-10-CM | POA: Diagnosis not present

## 2020-07-10 DIAGNOSIS — M2550 Pain in unspecified joint: Secondary | ICD-10-CM | POA: Diagnosis not present

## 2020-07-29 ENCOUNTER — Other Ambulatory Visit: Payer: Self-pay | Admitting: Internal Medicine

## 2020-07-29 ENCOUNTER — Other Ambulatory Visit: Payer: Self-pay | Admitting: Physician Assistant

## 2020-07-29 ENCOUNTER — Other Ambulatory Visit: Payer: Self-pay | Admitting: Cardiovascular Disease

## 2020-07-29 DIAGNOSIS — I2510 Atherosclerotic heart disease of native coronary artery without angina pectoris: Secondary | ICD-10-CM

## 2020-07-29 DIAGNOSIS — I10 Essential (primary) hypertension: Secondary | ICD-10-CM

## 2020-08-20 DIAGNOSIS — E7849 Other hyperlipidemia: Secondary | ICD-10-CM | POA: Diagnosis not present

## 2020-08-20 DIAGNOSIS — R7302 Impaired glucose tolerance (oral): Secondary | ICD-10-CM | POA: Diagnosis not present

## 2020-08-20 DIAGNOSIS — Z0000 Encounter for general adult medical examination without abnormal findings: Secondary | ICD-10-CM | POA: Diagnosis not present

## 2020-08-21 ENCOUNTER — Other Ambulatory Visit: Payer: 59 | Admitting: Internal Medicine

## 2020-08-21 LAB — CBC WITH DIFFERENTIAL/PLATELET
Absolute Monocytes: 440 cells/uL (ref 200–950)
Basophils Absolute: 50 cells/uL (ref 0–200)
Basophils Relative: 0.6 %
Eosinophils Absolute: 183 cells/uL (ref 15–500)
Eosinophils Relative: 2.2 %
HCT: 43.5 % (ref 35.0–45.0)
Hemoglobin: 15 g/dL (ref 11.7–15.5)
Lymphs Abs: 2241 cells/uL (ref 850–3900)
MCH: 32.3 pg (ref 27.0–33.0)
MCHC: 34.5 g/dL (ref 32.0–36.0)
MCV: 93.5 fL (ref 80.0–100.0)
MPV: 11.4 fL (ref 7.5–12.5)
Monocytes Relative: 5.3 %
Neutro Abs: 5387 cells/uL (ref 1500–7800)
Neutrophils Relative %: 64.9 %
Platelets: 193 10*3/uL (ref 140–400)
RBC: 4.65 10*6/uL (ref 3.80–5.10)
RDW: 11.8 % (ref 11.0–15.0)
Total Lymphocyte: 27 %
WBC: 8.3 10*3/uL (ref 3.8–10.8)

## 2020-08-21 LAB — COMPLETE METABOLIC PANEL WITH GFR
AG Ratio: 2 (calc) (ref 1.0–2.5)
ALT: 30 U/L — ABNORMAL HIGH (ref 6–29)
AST: 28 U/L (ref 10–35)
Albumin: 4.1 g/dL (ref 3.6–5.1)
Alkaline phosphatase (APISO): 87 U/L (ref 37–153)
BUN: 9 mg/dL (ref 7–25)
CO2: 30 mmol/L (ref 20–32)
Calcium: 8.9 mg/dL (ref 8.6–10.4)
Chloride: 103 mmol/L (ref 98–110)
Creat: 0.76 mg/dL (ref 0.50–1.05)
GFR, Est African American: 102 mL/min/{1.73_m2} (ref >=60)
GFR, Est Non African American: 88 mL/min/{1.73_m2} (ref >=60)
Globulin: 2.1 g/dL (calc) (ref 1.9–3.7)
Glucose, Bld: 108 mg/dL — ABNORMAL HIGH (ref 65–99)
Potassium: 3.8 mmol/L (ref 3.5–5.3)
Sodium: 140 mmol/L (ref 135–146)
Total Bilirubin: 0.5 mg/dL (ref 0.2–1.2)
Total Protein: 6.2 g/dL (ref 6.1–8.1)

## 2020-08-21 LAB — HEMOGLOBIN A1C
Hgb A1c MFr Bld: 5.9 % of total Hgb — ABNORMAL HIGH (ref <5.7)
Mean Plasma Glucose: 123 (calc)
eAG (mmol/L): 6.8 (calc)

## 2020-08-21 LAB — LIPID PANEL
Cholesterol: 143 mg/dL (ref <200)
HDL: 49 mg/dL — ABNORMAL LOW (ref >=50)
LDL Cholesterol (Calc): 73 mg/dL (calc)
Non-HDL Cholesterol (Calc): 94 mg/dL (calc) (ref <130)
Total CHOL/HDL Ratio: 2.9 (calc) (ref <5.0)
Triglycerides: 130 mg/dL (ref <150)

## 2020-08-21 LAB — TSH: TSH: 0.9 mIU/L

## 2020-08-21 LAB — VITAMIN D 25 HYDROXY (VIT D DEFICIENCY, FRACTURES): Vit D, 25-Hydroxy: 39 ng/mL (ref 30–100)

## 2020-08-25 ENCOUNTER — Encounter: Payer: Self-pay | Admitting: Internal Medicine

## 2020-08-25 ENCOUNTER — Ambulatory Visit (INDEPENDENT_AMBULATORY_CARE_PROVIDER_SITE_OTHER): Payer: 59 | Admitting: Internal Medicine

## 2020-08-25 ENCOUNTER — Other Ambulatory Visit: Payer: Self-pay

## 2020-08-25 VITALS — BP 120/80 | HR 88 | Ht 65.0 in | Wt 203.0 lb

## 2020-08-25 DIAGNOSIS — E8881 Metabolic syndrome: Secondary | ICD-10-CM

## 2020-08-25 DIAGNOSIS — R7302 Impaired glucose tolerance (oral): Secondary | ICD-10-CM

## 2020-08-25 DIAGNOSIS — F419 Anxiety disorder, unspecified: Secondary | ICD-10-CM

## 2020-08-25 DIAGNOSIS — Z0000 Encounter for general adult medical examination without abnormal findings: Secondary | ICD-10-CM | POA: Diagnosis not present

## 2020-08-25 DIAGNOSIS — G4733 Obstructive sleep apnea (adult) (pediatric): Secondary | ICD-10-CM | POA: Diagnosis not present

## 2020-08-25 DIAGNOSIS — F17200 Nicotine dependence, unspecified, uncomplicated: Secondary | ICD-10-CM

## 2020-08-25 DIAGNOSIS — F329 Major depressive disorder, single episode, unspecified: Secondary | ICD-10-CM

## 2020-08-25 DIAGNOSIS — Z8679 Personal history of other diseases of the circulatory system: Secondary | ICD-10-CM | POA: Diagnosis not present

## 2020-08-25 DIAGNOSIS — I252 Old myocardial infarction: Secondary | ICD-10-CM

## 2020-08-25 DIAGNOSIS — E039 Hypothyroidism, unspecified: Secondary | ICD-10-CM

## 2020-08-25 DIAGNOSIS — Z6833 Body mass index (BMI) 33.0-33.9, adult: Secondary | ICD-10-CM | POA: Diagnosis not present

## 2020-08-25 DIAGNOSIS — F32A Depression, unspecified: Secondary | ICD-10-CM

## 2020-08-25 DIAGNOSIS — I10 Essential (primary) hypertension: Secondary | ICD-10-CM

## 2020-08-25 DIAGNOSIS — M7918 Myalgia, other site: Secondary | ICD-10-CM | POA: Diagnosis not present

## 2020-08-25 DIAGNOSIS — G8929 Other chronic pain: Secondary | ICD-10-CM

## 2020-08-25 LAB — POCT URINALYSIS DIPSTICK
Appearance: NEGATIVE
Bilirubin, UA: NEGATIVE
Blood, UA: NEGATIVE
Glucose, UA: NEGATIVE
Ketones, UA: NEGATIVE
Leukocytes, UA: NEGATIVE
Nitrite, UA: NEGATIVE
Odor: NEGATIVE
Protein, UA: NEGATIVE
Spec Grav, UA: 1.01 (ref 1.010–1.025)
Urobilinogen, UA: 0.2 E.U./dL
pH, UA: 6.5 (ref 5.0–8.0)

## 2020-08-25 MED ORDER — KETOCONAZOLE 2 % EX CREA: 1.0000 "application " | TOPICAL_CREAM | Freq: Every day | CUTANEOUS | 0 refills | Status: AC

## 2020-08-25 MED ORDER — PREDNISONE 10 MG PO TABS: ORAL_TABLET | ORAL | 0 refills | Status: DC

## 2020-08-25 NOTE — Progress Notes (Signed)
Subjective:    Patient ID: [name], female    DOB: [birth year], 54 y.o.   MRN: [id number]  HPI 54 year old Female for health maintenance exam and evaluation of medical issues.  Unfortunately continues to smoke despite history of MI.  History of basal cell carcinoma on face with Mohs surgery by Dr. Sarajane Jews in October 2020  History of chronic pain syndrome seen at [facility] pain management.  History of open-angle glaucoma seen by Dr. Katy Fitch  History of carpal tunnel syndrome  History of impaired glucose tolerance-hemoglobin A1c 5.9%  Saw Dr. Vaughan Browner for COPD and sleep apnea last year.  Smoking about half pack of cigarettes daily.  Chest x-ray was normal.  Mild sleep apnea with oxygen desat 84%.  He offered Chantix but she declined.  He did not feel that she met criteria for low-dose screening CT given age and pack-year smoking history.  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  She had subtotal occlusion of very small branch of diagonal and no other significant disease.  She had cardiac catheterization and did well.  She had a very serious case of a chest perforation October 2006.  She developed purpuric lesions on her lower extremities and maroon-colored stools as well as anemia.  CT showed diffuse colitis.  She was transferred to [facility] after developing tea colored urine with hematuria.  She was treated with high-dose steroids which she remained on for several months.  She has never had a recurrence.  In September 2009 she was hospitalized for gastroenteritis secondary to C. difficile.  In 2009, she had normal cardiac nuclear study.  In 2006 she had a 2D echocardiogram that was normal.  History of migraine headaches.  History of cervical radiculopathy in 2012.  Has been evaluated by Dr. Vertell Limber in the remote past.  History of attention deficit disorder but this has been discontinued.  History of endometrial polyp causing menorrhagia removed by Dr. Theda Sers  November 2003.  She has [place] in 1999.  Appendectomy in the early 1970s.  Chronic fibromyalgia type pain for which she requires chronic pain management.  No history of fractures.  Social history: She is married.  Has 3 adult sons.  Does not consume alcohol.  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  Family history: Mother passed away from acute respiratory arrest.  Both parents with history of hypertension.  Father with history of stroke and resides alone.  2 brothers in good health.  No sisters.  Has had palpitations evaluated by cardiology.  She has impaired glucose tolerance and hypothyroidism.  She is not on beta-blocker.  She had Holter monitor 2018 showing PACs and PVCs with no sustained arrhythmia.  Received flu vaccine through employment.  Has GYN physician.  BMI 33.78.  Weight is 203 pounds.  Last year her weight was 201 pounds at physical exam  Review of Systems  Constitutional: Positive for fatigue.  Respiratory:       History of sleep apnea.  History of mild COPD.  Cardiovascular: Negative for chest pain.  Neurological:       History of migraine headaches  Psychiatric/Behavioral:       Work stress       Objective:   Physical Exam Blood pressure 120/80, pulse 88, pulse oximetry 98% weight 203 pounds height 5 feet 5 inches BMI 33.78.  Skin warm and dry.  Nodes none.  Neck is supple without thyromegaly or adenopathy.  Chest clear.  Cardiac  exam regular rate and rhythm normal S1 and S2 without murmurs or gallops.  Abdomen soft nondistended without hepatosplenomegaly masses or tenderness.  GYN exam is deferred to gynecologist.  No carotid bruits.  Neuro intact without focal deficits.       Assessment & Plan:  History of smoking-has seen pulmonologist regarding sleep apnea and smoking history.  Not a candidate for CT lung cancer screening according to pulmonary.  Flu vaccine through employment.  Has had 2 COVID-19 vaccines and can  receive third 1 soon  BMI 33.78.  Encourage diet exercise and weight loss.  Have suggested Dr. Migdalia Dk clinic in the past.  Anxiety depression and situational stress at work.  Heart disease continue follow-up with [facility] cardiology.  Please quit smoking.  Hyperplastic colon polyps on colonoscopy 2016  History of palpitations that are nonsustained based on Holter monitor  Remote history of a chest perforator with no recurrence  Essential hypertension-stable on current regimen  Hypothyroidism-stable on current dose of thyroid replacement  GE reflux treated with PPI  Impaired glucose tolerance-hemoglobin A1c 5.9%.  Plan: Continue to encourage diet exercise and weight loss.  Continue current medications.  Advised to quit smoking.  Follow-up in 6 months.

## 2020-09-01 DIAGNOSIS — Z85828 Personal history of other malignant neoplasm of skin: Secondary | ICD-10-CM | POA: Diagnosis not present

## 2020-09-01 DIAGNOSIS — D2261 Melanocytic nevi of right upper limb, including shoulder: Secondary | ICD-10-CM | POA: Diagnosis not present

## 2020-09-01 DIAGNOSIS — L918 Other hypertrophic disorders of the skin: Secondary | ICD-10-CM | POA: Diagnosis not present

## 2020-09-01 DIAGNOSIS — L821 Other seborrheic keratosis: Secondary | ICD-10-CM | POA: Diagnosis not present

## 2020-09-01 DIAGNOSIS — L738 Other specified follicular disorders: Secondary | ICD-10-CM | POA: Diagnosis not present

## 2020-09-01 DIAGNOSIS — L570 Actinic keratosis: Secondary | ICD-10-CM | POA: Diagnosis not present

## 2020-09-01 DIAGNOSIS — D225 Melanocytic nevi of trunk: Secondary | ICD-10-CM | POA: Diagnosis not present

## 2020-09-01 DIAGNOSIS — D692 Other nonthrombocytopenic purpura: Secondary | ICD-10-CM | POA: Diagnosis not present

## 2020-09-01 DIAGNOSIS — L304 Erythema intertrigo: Secondary | ICD-10-CM | POA: Diagnosis not present

## 2020-09-01 IMAGING — CR DG CERVICAL SPINE COMPLETE 4+V
6 series · 6 of 6 positions shown · non-contrast
Comparison: Cervical spine MRI dated 05/09/2006.

CLINICAL DATA: 54-year-old female with neck pain.

EXAM:
CERVICAL SPINE - COMPLETE 4+ VIEW

[w cervical spine lat]
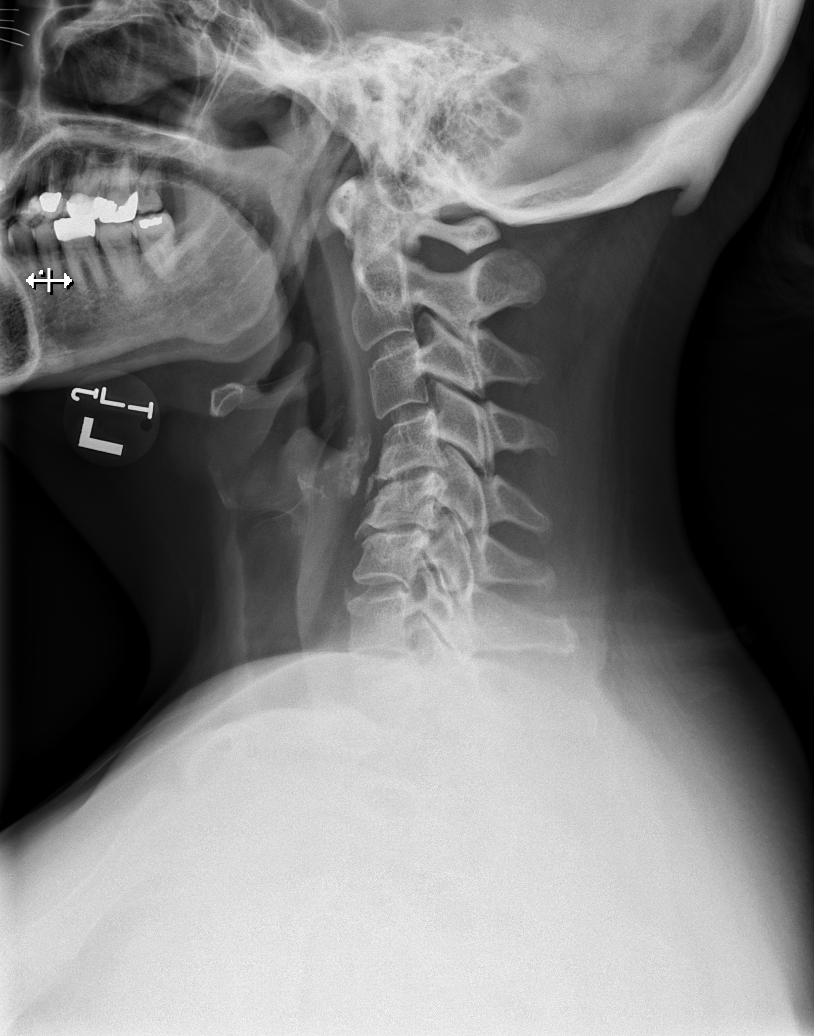

[w cervical spine ap_obl (1 of 2)]
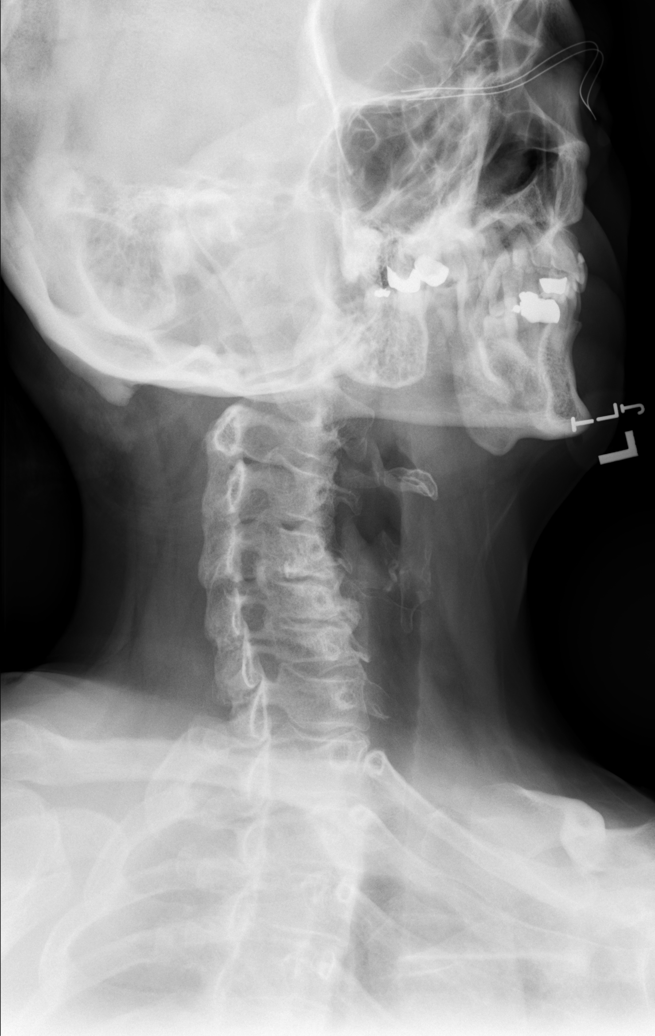

[w cervical spine ap_obl (2 of 2)]
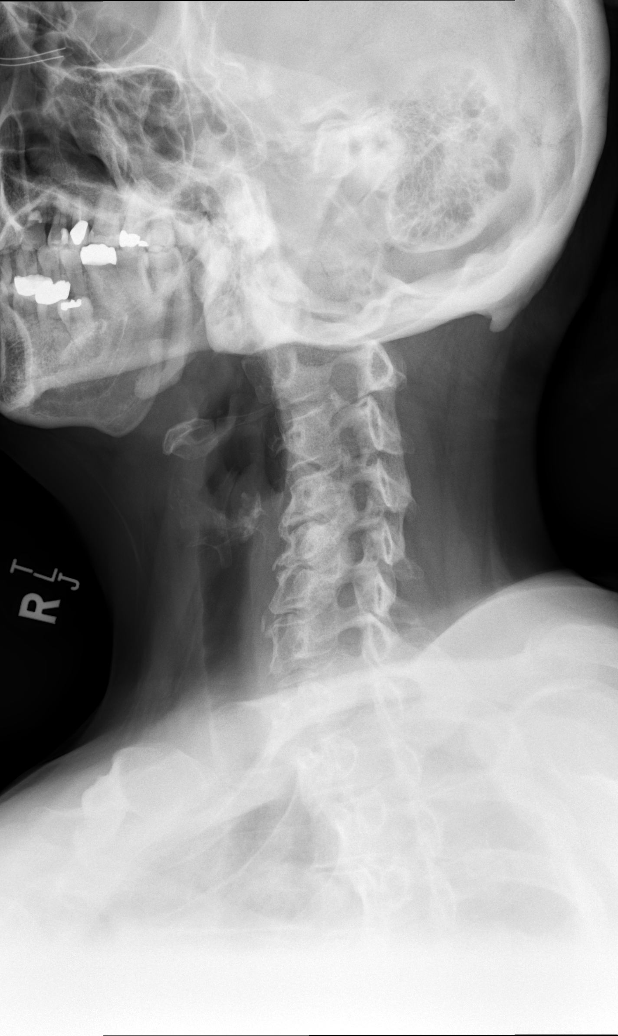

[w cervical spine ap]
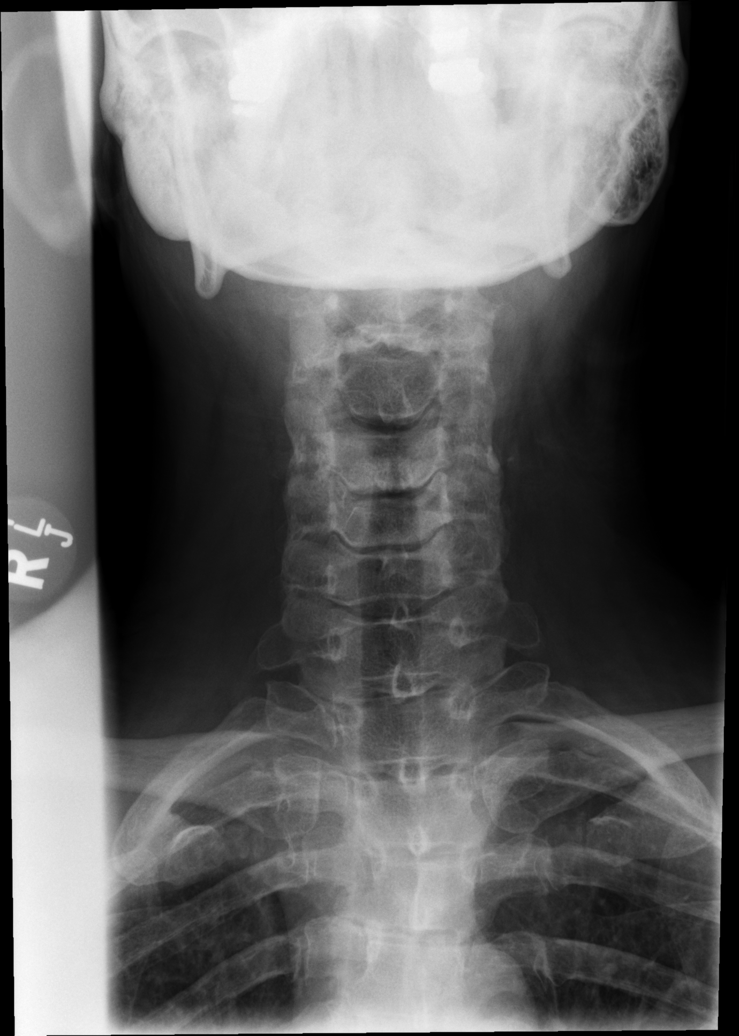

[w cervical spine odontoid (1 of 2)]
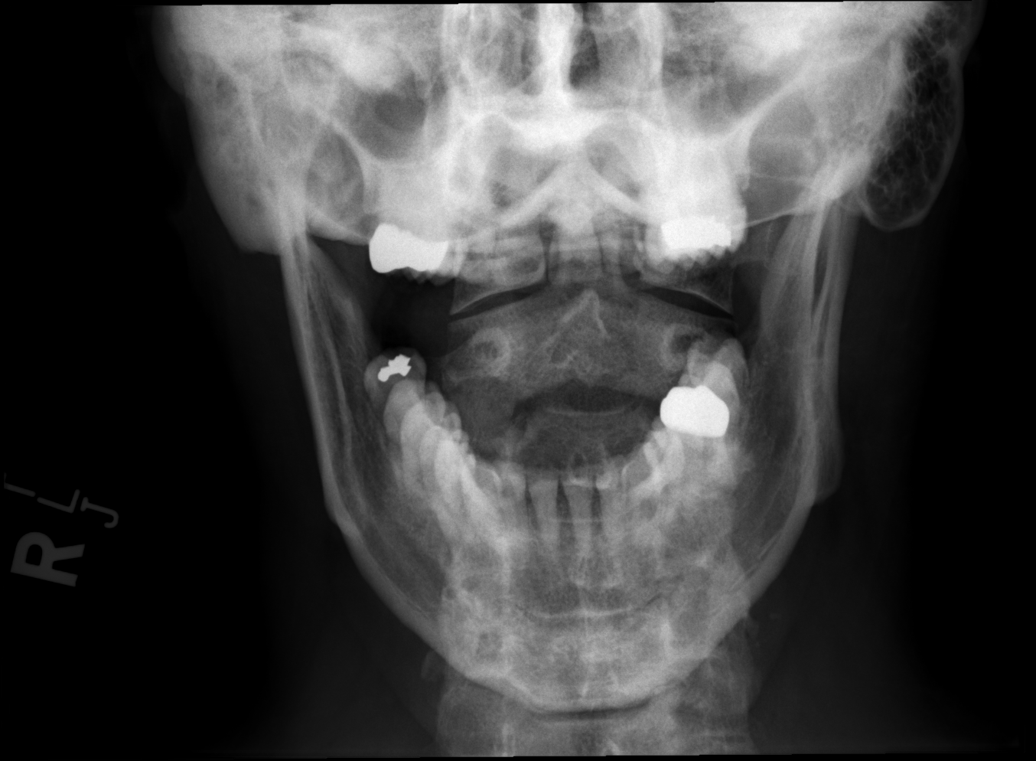

[w cervical spine odontoid (2 of 2)]
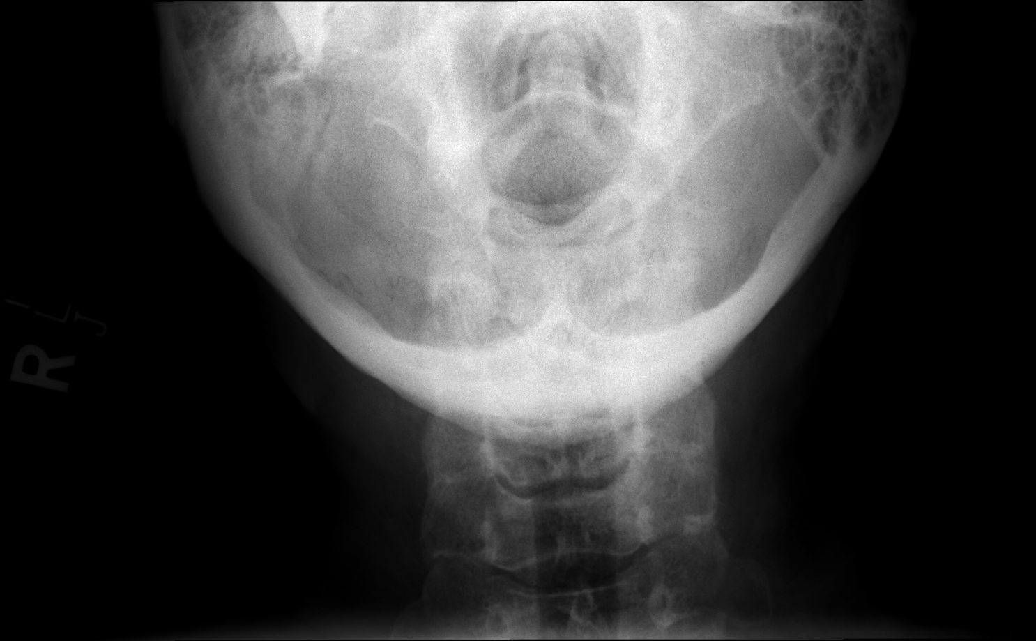

[6 of 6 positions shown; findings below may reference images not displayed]

FINDINGS: There is no acute fracture or subluxation of the cervical spine.
There is reversal of normal cervical lordosis at C4 which may be
related to muscle spasm or chronic changes. Degenerative changes at
C4-C6 with endplate irregularity and disc space narrowing and
anterior spurring. The visualized posterior elements and odontoid
are intact. There is anatomic alignment of the lateral masses of C1
and C2. The soft tissues are unremarkable.
IMPRESSION: 1. No acute fracture or subluxation.
2. Degenerative changes of the mid cervical spine.

## 2020-09-03 ENCOUNTER — Other Ambulatory Visit: Payer: Self-pay | Admitting: Internal Medicine

## 2020-09-03 ENCOUNTER — Telehealth: Payer: Self-pay | Admitting: Internal Medicine

## 2020-09-03 DIAGNOSIS — I10 Essential (primary) hypertension: Secondary | ICD-10-CM

## 2020-09-03 DIAGNOSIS — I2510 Atherosclerotic heart disease of native coronary artery without angina pectoris: Secondary | ICD-10-CM

## 2020-09-03 NOTE — Telephone Encounter (Signed)
[name] [phone]  [name] called to say her neck is better, however she now has a cough at night and her mouth and tongue is raw and irritated, no drainaged, no fever. She feels fine, just mouth very sore, no white spots.

## 2020-09-03 NOTE — Telephone Encounter (Signed)
LVM to CB.

## 2020-09-03 NOTE — Telephone Encounter (Signed)
Spoke with patient she will come by and pick up RX, and them will call back if cough persist

## 2020-09-03 NOTE — Telephone Encounter (Signed)
She was given Prednisone and now has a stomatitis. I am writing her a Rx for Magic Mouthwash that she will need to pick up as they do not accept fax Rxs. Try this first, See Friday or Monday if cough persists

## 2020-09-04 DIAGNOSIS — G894 Chronic pain syndrome: Secondary | ICD-10-CM | POA: Diagnosis not present

## 2020-09-04 DIAGNOSIS — M2550 Pain in unspecified joint: Secondary | ICD-10-CM | POA: Diagnosis not present

## 2020-09-04 DIAGNOSIS — M542 Cervicalgia: Secondary | ICD-10-CM | POA: Diagnosis not present

## 2020-09-04 DIAGNOSIS — M47817 Spondylosis without myelopathy or radiculopathy, lumbosacral region: Secondary | ICD-10-CM | POA: Diagnosis not present

## 2020-09-04 NOTE — Patient Instructions (Addendum)
Please try to take care of yourself during this stressful time of the pandemic.  Please consider a diet program.  Please quit smoking.  Continue current medications.  Follow-up in 6 months.  Have flu vaccine and Covid booster through employment.

## 2020-09-05 ENCOUNTER — Ambulatory Visit (INDEPENDENT_AMBULATORY_CARE_PROVIDER_SITE_OTHER): Payer: 59 | Admitting: Internal Medicine

## 2020-09-05 ENCOUNTER — Encounter: Payer: Self-pay | Admitting: Internal Medicine

## 2020-09-05 ENCOUNTER — Ambulatory Visit (HOSPITAL_COMMUNITY)
Admission: RE | Admit: 2020-09-05 | Discharge: 2020-09-05 | Disposition: A | Discharge status: Home / Self Care | Payer: 59 | Source: Ambulatory Visit | Attending: Internal Medicine | Admitting: Internal Medicine

## 2020-09-05 ENCOUNTER — Other Ambulatory Visit: Payer: Self-pay

## 2020-09-05 ENCOUNTER — Telehealth: Payer: Self-pay

## 2020-09-05 ENCOUNTER — Other Ambulatory Visit: Payer: Self-pay | Admitting: Internal Medicine

## 2020-09-05 VITALS — BP 130/90 | HR 88 | Temp 98.1°F | Ht 65.0 in | Wt 200.0 lb

## 2020-09-05 DIAGNOSIS — R053 Chronic cough: Secondary | ICD-10-CM

## 2020-09-05 DIAGNOSIS — J22 Unspecified acute lower respiratory infection: Secondary | ICD-10-CM

## 2020-09-05 DIAGNOSIS — R059 Cough, unspecified: Secondary | ICD-10-CM | POA: Diagnosis not present

## 2020-09-05 DIAGNOSIS — Z20822 Contact with and (suspected) exposure to covid-19: Secondary | ICD-10-CM

## 2020-09-05 MED ORDER — PREDNISONE 10 MG PO TABS: ORAL_TABLET | ORAL | 0 refills | Status: DC

## 2020-09-05 MED ORDER — FLUTICASONE-SALMETEROL 250-50 MCG/DOSE IN AEPB: 1.0000 | INHALATION_SPRAY | Freq: Two times a day (BID) | RESPIRATORY_TRACT | 2 refills | Status: DC

## 2020-09-05 MED ORDER — CLARITHROMYCIN 500 MG PO TABS: 500.0000 mg | ORAL_TABLET | Freq: Two times a day (BID) | ORAL | 0 refills | Status: DC

## 2020-09-05 NOTE — Progress Notes (Signed)
   Subjective:    Patient ID: Damian Leavell, female    DOB: 01/06/1966, 54 y.o.   MRN: [id number]  HPI Son has been diagnosed with Covid-19. He had attended a hockey tournament recently. He had positive Covid-19 test on Sept 28. Patient has had a negative Covid test yesterday.She has had a cough onset about a week ago. Had been prednisone for her neck pain recently.  She works at [employer].  Cough is slightly productive.    Review of Systems No fever, no chills, no nausea or vomiting.  No dysgeusia.  No myalgias.     Objective:   Physical Exam Blood pressure 130/90 pulse 88 regular temperature 98.1 degrees pulse oximetry 97% weight 200 pounds BMI 33.28       Assessment & Plan:    Acute lower respiratory infection  Close exposure to COVID-19 with her son but patient has tested negative  History of smoking  Plan: Biaxin 500 mg twice daily for 10 days.  Tessalon Perles up to 3 times daily as needed for cough.  Rest and drink plenty of fluids.  Use Ventolin inhaler.

## 2020-09-05 NOTE — Telephone Encounter (Signed)
Patient called she is still coughing, her son that lives in her home has tested positive for COVID 09/02/20. Health at work tested her yesterday for [employer] and she was negative. She wants to know if she can have something for the cough. No fever.

## 2020-09-05 NOTE — Telephone Encounter (Signed)
Scheduled, chest xray placed. Patient will go to [facility].

## 2020-09-05 NOTE — Telephone Encounter (Signed)
Needs CXR and office visit. Can she come at 4:30 pm? Get CXR before she comes stat?

## 2020-09-06 ENCOUNTER — Other Ambulatory Visit: Payer: Self-pay | Admitting: Internal Medicine

## 2020-09-06 MED ORDER — BENZONATATE 100 MG PO CAPS
200.0000 mg | ORAL_CAPSULE | Freq: Three times a day (TID) | ORAL | 1 refills | Status: DC | PRN
Start: 2020-09-06 — End: 2021-01-02

## 2020-09-08 ENCOUNTER — Other Ambulatory Visit: Payer: Self-pay

## 2020-09-08 ENCOUNTER — Encounter: Payer: Self-pay | Admitting: Cardiovascular Disease

## 2020-09-08 ENCOUNTER — Ambulatory Visit (INDEPENDENT_AMBULATORY_CARE_PROVIDER_SITE_OTHER): Payer: 59 | Admitting: Cardiovascular Disease

## 2020-09-08 VITALS — BP 120/70 | HR 81 | Ht 65.0 in | Wt 204.0 lb

## 2020-09-08 DIAGNOSIS — I10 Essential (primary) hypertension: Secondary | ICD-10-CM

## 2020-09-08 DIAGNOSIS — Z720 Tobacco use: Secondary | ICD-10-CM

## 2020-09-08 DIAGNOSIS — I2510 Atherosclerotic heart disease of native coronary artery without angina pectoris: Secondary | ICD-10-CM

## 2020-09-08 DIAGNOSIS — E782 Mixed hyperlipidemia: Secondary | ICD-10-CM

## 2020-09-08 NOTE — Patient Instructions (Signed)

## 2020-09-08 NOTE — Progress Notes (Signed)
Cardiology Office Note:    Date:  09/08/2020   ID:  [name], DOB 12-16-1965, MRN [id number]  PCP:  Elby Showers, MD  [facility] Cardiologist:  Sherren Mocha, MD  [place] Electrophysiologist:  None   Referring MD: Elby Showers, MD   Chief Complaint  Patient presents with  . Coronary Artery Disease    History of Present Illness:    [name] is a 54 y.o. female with a hx of coronary artery disease, presenting for follow-up evaluation.  The patient initially presented in 2013 with an anterior wall MI.  Cardiac catheterization demonstrated subtotal occlusion of a small diagonal branch with patency of the LAD and no other obstructive coronary disease.  LV function was within normal limits.  There was suspicion for coronary vasospasm.  The patient was treated medically with no recurrent ischemic events since that time.  Comorbid conditions include hypertension, tobacco use, gastroesophageal reflux disease, and hypothyroidism.  The patient is here alone today.  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  She has a lot of stress with her job but overall is doing well.  She continues to smoke cigarettes.  She has some chest wall pain but no exertional angina.  No shortness of breath, heart palpitations, orthopnea, or PND.  Past Medical History:  Diagnosis Date  . ADHD (attention deficit hyperactivity disorder)   . Anxiety   . Asthma   . CAD (coronary artery disease)    a. 11/2012 Acute Anterior STEMI/Cath: Nonobs dzs except subtotal occlusion of small, 2mm D2, EF 55-65%;  b. 11/2012  Echo: EF 60-65%, nl wall motion, Gr 2 DD.  Marland Kitchen Chronic kidney disease    in setting of HSP 2006  . Coronary vasospasm (HCC)    a. presumed  . Depression   . GERD (gastroesophageal reflux disease)   . 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 Hypertension    Controlled - she states she is on BP meds for HSP  . Hypothyroidism   . Migraines   . Sleep apnea    "lost 50#; no problems w/it now" (11/22/2012)  . 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 Tobacco abuse     Past Surgical History:  Procedure Laterality Date  . APPENDECTOMY  1970's  . CARDIAC CATHETERIZATION  11/22/2012   "first one" (11/22/2012)  . [place]; 1996; 1999  . DILATION AND CURETTAGE OF UTERUS  1990's  . LEFT HEART CATHETERIZATION WITH CORONARY ANGIOGRAM N/A 11/22/2012   Procedure: LEFT HEART CATHETERIZATION WITH CORONARY ANGIOGRAM;  Surgeon: Sherren Mocha, MD;  Location: [facility] CATH LAB;  Service: Cardiovascular;  Laterality: N/A;    Current Medications: Current Meds  Medication Sig  . albuterol (VENTOLIN HFA) 108 (90 Base) MCG/ACT inhaler Inhale 2 puffs into the lungs every 6 (six) hours as needed for wheezing or shortness of breath.  . ALPRAZolam (XANAX) 0.5 MG tablet Take 1 tablet (0.5 mg total) by mouth 2 (two) times daily as needed for anxiety.  Marland Kitchen amLODipine (NORVASC) 5 MG tablet TAKE 1 TABLET (5 MG TOTAL) BY MOUTH DAILY.  Marland Kitchen aspirin EC 81 MG EC tablet Take 1 tablet (81 mg total) by mouth daily.  Marland Kitchen atorvastatin (LIPITOR) 40 MG  tablet TAKE 1 TABLET BY MOUTH DAILY AT 6:00 PM  . benzonatate (TESSALON) 100 MG capsule Take 2 capsules (200 mg total) by mouth 3 (three) times daily as needed for cough.  . calcium-vitamin D (OSCAL WITH D) 500-200 MG-UNIT per tablet Take 1 tablet by mouth daily.  . Cholecalciferol (VITAMIN D3) 2000 units TABS Take 2,000 Units by mouth daily.  . clarithromycin (BIAXIN) 500 MG tablet Take 1 tablet (500 mg total) by mouth 2 (two) times daily.  Marland Kitchen desvenlafaxine (PRISTIQ) 50 MG 24 hr tablet TAKE 1 TABLET (50 MG TOTAL) BY MOUTH DAILY.  Marland Kitchen enalapril (VASOTEC) 10 MG tablet TAKE 1 TABLET (10 MG TOTAL) BY  MOUTH DAILY.  Marland Kitchen Fluticasone-Salmeterol (ADVAIR DISKUS) 250-50 MCG/DOSE AEPB Inhale 1 puff into the lungs in the morning and at bedtime.  Marland Kitchen ketoconazole (NIZORAL) 2 % cream Apply 1 application topically daily.  Marland Kitchen levothyroxine (SYNTHROID) 75 MCG tablet TAKE 1 TABLET (75 MCG TOTAL) BY MOUTH DAILY.  Marland Kitchen loratadine (CLARITIN) 10 MG tablet Take 10 mg by mouth daily.  . nitroGLYCERIN (NITROSTAT) 0.4 MG SL tablet Place 1 tablet (0.4 mg total) under the tongue every 5 (five) minutes as needed for chest pain.  . pantoprazole (PROTONIX) 40 MG tablet TAKE 1 TABLET BY MOUTH TWICE A DAY BEFORE A MEAL.     Allergies:   Levaquin [levofloxacin] and Nsaids   Social History   Socioeconomic History  . Marital status: Married    Spouse name: Not on file  . Number of children: Not on file  . Years of education: Not on file  . Highest education level: Not on file  Occupational History  . Occupation: RN/UC    Employer: [employer]    Comment: [employer]  Tobacco Use  . Smoking status: Current Every Day Smoker    Packs/day: 0.50    Years: 25.00    Pack years: 12.50    Types: Cigarettes  . Smokeless tobacco: Never Used  Vaping Use  . Vaping Use: Never used  Substance and Sexual Activity  . Alcohol use: No  . Drug use: No  . Sexual activity: Yes  Other Topics Concern  . Not on file  Social History Narrative  . Not on file   Social Determinants of Health   Financial Resource Strain:   . Difficulty of Paying Living Expenses: Not on file  Food Insecurity:   . Worried About Charity fundraiser in the Last Year: Not on file  . Ran Out of Food in the Last Year: Not on file  Transportation Needs:   . Lack of Transportation (Medical): Not on file  . Lack of Transportation (Non-Medical): Not on file  Physical Activity:   . Days of Exercise per Week: Not on file  . Minutes of Exercise per Session: Not on file  Stress:   . Feeling of Stress : Not on file  Social Connections:   .  Frequency of Communication with Friends and Family: Not on file  . Frequency of Social Gatherings with Friends and Family: Not on file  . Attends Religious Services: Not on file  . Active Member of Clubs or Organizations: Not on file  . Attends Archivist Meetings: Not on file  . Marital Status: Not on file     Family History: The patient's family history includes Hypertension in her mother; Stroke in her father. There is no history of Heart disease.  ROS:   Please see the history of present  illness.    Positive for cough.  All other systems reviewed and are negative.  EKGs/Labs/Other Studies Reviewed:     EKG:  EKG is ordered today.  The ekg ordered today demonstrates normal sinus rhythm 81 bpm, within normal limits.  Recent Labs: 08/20/2020: ALT 30; BUN 9; Creat 0.76; Hemoglobin 15.0; Platelets 193; Potassium 3.8; Sodium 140; TSH 0.90  Recent Lipid Panel    Component Value Date/Time   CHOL 143 08/20/2020 1031   TRIG 130 08/20/2020 1031   HDL 49 (L) 08/20/2020 1031   CHOLHDL 2.9 08/20/2020 1031   VLDL 23 06/21/2017 1014   LDLCALC 73 08/20/2020 1031    Physical Exam:    VS:  BP 120/70   Pulse 81   Ht 5\' 5"  (1.651 m)   Wt 204 lb (92.5 kg)   SpO2 94%   BMI 33.95 kg/m     Wt Readings from Last 3 Encounters:  09/08/20 204 lb (92.5 kg)  09/05/20 200 lb (90.7 kg)  08/25/20 203 lb (92.1 kg)     GEN:  Well nourished, well developed in no acute distress HEENT: Normal NECK: No JVD; No carotid bruits LYMPHATICS: No lymphadenopathy CARDIAC: RRR, no murmurs, rubs, gallops RESPIRATORY:  Clear to auscultation without rales, wheezing or rhonchi  ABDOMEN: Soft, non-tender, non-distended MUSCULOSKELETAL:  No edema; No deformity  SKIN: Warm and dry NEUROLOGIC:  Alert and oriented x 3 PSYCHIATRIC:  Normal affect   ASSESSMENT:    1. Coronary artery disease involving native coronary artery of native heart without angina pectoris   2. Essential hypertension   3.  Mixed hyperlipidemia   4. Tobacco use    PLAN:    In order of problems listed above:  1. Some atypical symptoms, but nothing suspicious for angina.  She continues on aspirin for antiplatelet therapy, high intensity statin drug, and amlodipine.  Seems to be tolerating her medicines well. 2. Blood pressure well controlled on amlodipine and enalapril.  No changes made today. 3. Treated with atorvastatin 40 mg daily.  Recent lipids reviewed with LDL cholesterol 73 mg/dL, HDL 49.  Lifestyle modification discussed. 4. Tobacco cessation counseling done.  Patient is not ready to quit.  Overall she seems to be doing well from a cardiac perspective.  I would like to see her back in 1 year for follow-up evaluation.   Medication Adjustments/Labs and Tests Ordered: Current medicines are reviewed at length with the patient today.  Concerns regarding medicines are outlined above.  Orders Placed This Encounter  Procedures  . EKG 12-Lead   No orders of the defined types were placed in this encounter.   Patient Instructions  Medication Instructions:  Your provider recommends that you continue on your current medications as directed. Please refer to the Current Medication list given to you today.   *If you need a refill on your cardiac medications before your next appointment, please call your pharmacy*  Follow-Up: At [facility], you and your health needs are our priority.  As part of our continuing mission to provide you with exceptional heart care, we have created designated Provider Care Teams.  These Care Teams include your primary Cardiologist (physician) and Advanced Practice Providers (APPs -  Physician Assistants and Nurse Practitioners) who all work together to provide you with the care you need, when you need it. Your next appointment:   12 month(s) The format for your next appointment:   In Person Provider:   You may see Sherren Mocha, MD or one of the following  Advanced Practice  Providers on your designated Care Team:    Richardson Dopp, PA-C  Robbie Lis, [place]      Signed, Sherren Mocha, MD  09/08/2020 11:09 AM    [place]

## 2020-09-27 ENCOUNTER — Encounter: Payer: Self-pay | Admitting: Internal Medicine

## 2020-09-27 NOTE — Patient Instructions (Signed)
You have been diagnosed with an acute lower respiratory infection.  You tested negative for COVID-19.  Rest and drink plenty of fluids.  Take Biaxin 500 mg twice daily for 10 days.  Take Tessalon Perles up to 3 times daily as needed for cough.  May use Ventolin inhaler.

## 2020-10-29 ENCOUNTER — Other Ambulatory Visit: Payer: Self-pay | Admitting: Cardiovascular Disease

## 2020-10-29 DIAGNOSIS — I2510 Atherosclerotic heart disease of native coronary artery without angina pectoris: Secondary | ICD-10-CM

## 2020-10-29 DIAGNOSIS — I10 Essential (primary) hypertension: Secondary | ICD-10-CM

## 2020-11-03 ENCOUNTER — Other Ambulatory Visit (HOSPITAL_COMMUNITY): Payer: Self-pay | Admitting: Physical Medicine and Rehabilitation

## 2020-11-03 DIAGNOSIS — M542 Cervicalgia: Secondary | ICD-10-CM | POA: Diagnosis not present

## 2020-11-03 DIAGNOSIS — Z79891 Long term (current) use of opiate analgesic: Secondary | ICD-10-CM | POA: Diagnosis not present

## 2020-11-03 DIAGNOSIS — M2550 Pain in unspecified joint: Secondary | ICD-10-CM | POA: Diagnosis not present

## 2020-11-03 DIAGNOSIS — G894 Chronic pain syndrome: Secondary | ICD-10-CM | POA: Diagnosis not present

## 2020-11-03 DIAGNOSIS — M47817 Spondylosis without myelopathy or radiculopathy, lumbosacral region: Secondary | ICD-10-CM | POA: Diagnosis not present

## 2020-11-19 DIAGNOSIS — Z01419 Encounter for gynecological examination (general) (routine) without abnormal findings: Secondary | ICD-10-CM | POA: Diagnosis not present

## 2020-11-19 DIAGNOSIS — Z6834 Body mass index (BMI) 34.0-34.9, adult: Secondary | ICD-10-CM | POA: Diagnosis not present

## 2020-11-25 ENCOUNTER — Encounter: Payer: Self-pay | Admitting: Internal Medicine

## 2020-11-25 ENCOUNTER — Telehealth: Payer: Self-pay | Admitting: Internal Medicine

## 2020-11-25 ENCOUNTER — Other Ambulatory Visit: Payer: Self-pay

## 2020-11-25 ENCOUNTER — Ambulatory Visit (INDEPENDENT_AMBULATORY_CARE_PROVIDER_SITE_OTHER): Payer: 59 | Admitting: Internal Medicine

## 2020-11-25 DIAGNOSIS — Z8679 Personal history of other diseases of the circulatory system: Secondary | ICD-10-CM

## 2020-11-25 DIAGNOSIS — F439 Reaction to severe stress, unspecified: Secondary | ICD-10-CM

## 2020-11-25 DIAGNOSIS — I252 Old myocardial infarction: Secondary | ICD-10-CM

## 2020-11-25 DIAGNOSIS — R002 Palpitations: Secondary | ICD-10-CM

## 2020-11-25 NOTE — Telephone Encounter (Signed)
[name] [phone]  [name] called to say on Thursday night she had an Irregular Heart beat, she had gotten upset at work. She is feeling okay now. She would like to come in and talk with you about this.

## 2020-11-25 NOTE — Progress Notes (Signed)
See telephone call with patient. Has had palpitations and situational stress. See phone call documented. Patient advised to call Dr. Antionette Char office for appt. Has Xanax which helps stress.

## 2020-11-25 NOTE — Telephone Encounter (Signed)
Scheduled phone visit.

## 2020-11-25 NOTE — Telephone Encounter (Addendum)
I have spoken on phone with patient this am. Has situational stress with father who no longer drives. Has had some issues with performance review at work which was upsetting as she was described as being slow by co-workers. Began to have arrhythmia shortly thereafter. Took Xanax and went to bed. Is wondering if EKG changed. No chest pain.  Arrhythmia not present today.  Will arrange for cardiology evaluation.  May take Xanax sparingly for anxiety.  MJB,MD

## 2020-11-25 NOTE — Patient Instructions (Signed)
Patient has Xanax for situational stress. Patient should consider counseling. Please call Dr. Burt Knack for Cardiology appt.

## 2020-12-15 ENCOUNTER — Ambulatory Visit (INDEPENDENT_AMBULATORY_CARE_PROVIDER_SITE_OTHER): Payer: 59 | Admitting: Internal Medicine

## 2020-12-15 ENCOUNTER — Encounter: Payer: Self-pay | Admitting: Internal Medicine

## 2020-12-15 ENCOUNTER — Ambulatory Visit (HOSPITAL_COMMUNITY)
Admission: RE | Admit: 2020-12-15 | Discharge: 2020-12-15 | Disposition: A | Discharge status: Home / Self Care | Payer: 59 | Source: Ambulatory Visit | Attending: Internal Medicine | Admitting: Internal Medicine

## 2020-12-15 ENCOUNTER — Other Ambulatory Visit: Payer: Self-pay | Admitting: Internal Medicine

## 2020-12-15 ENCOUNTER — Other Ambulatory Visit: Payer: Self-pay

## 2020-12-15 ENCOUNTER — Telehealth: Payer: Self-pay | Admitting: Internal Medicine

## 2020-12-15 VITALS — HR 95 | Temp 98.2°F

## 2020-12-15 DIAGNOSIS — R509 Fever, unspecified: Secondary | ICD-10-CM | POA: Diagnosis not present

## 2020-12-15 DIAGNOSIS — R0602 Shortness of breath: Secondary | ICD-10-CM

## 2020-12-15 DIAGNOSIS — R062 Wheezing: Secondary | ICD-10-CM

## 2020-12-15 DIAGNOSIS — R059 Cough, unspecified: Secondary | ICD-10-CM

## 2020-12-15 DIAGNOSIS — R52 Pain, unspecified: Secondary | ICD-10-CM

## 2020-12-15 LAB — POCT INFLUENZA A/B
Influenza A, POC: NEGATIVE
Influenza B, POC: NEGATIVE

## 2020-12-15 MED ORDER — HYDROCODONE-HOMATROPINE 5-1.5 MG/5ML PO SYRP: 5.0000 mL | ORAL_SOLUTION | Freq: Three times a day (TID) | ORAL | 0 refills | Status: DC | PRN

## 2020-12-15 MED ORDER — CLARITHROMYCIN 500 MG PO TABS: 500.0000 mg | ORAL_TABLET | Freq: Two times a day (BID) | ORAL | 0 refills | Status: DC

## 2020-12-15 MED ORDER — PREDNISONE 10 MG PO TABS
10.0000 mg | ORAL_TABLET | Freq: Every day | ORAL | 0 refills | Status: DC
Start: 2020-12-15 — End: 2020-12-24

## 2020-12-15 MED ORDER — FLUCONAZOLE 150 MG PO TABS
150.0000 mg | ORAL_TABLET | Freq: Once | ORAL | 0 refills | Status: DC
Start: 2020-12-15 — End: 2020-12-15

## 2020-12-15 NOTE — Patient Instructions (Signed)
Chest x-ray consistent with pneumonia.  COVID-19 test pending as is respiratory virus panel.  Could be due to aspiration or infectious agent.  She will be out of work for 1 week.  Follow-up in a week.  Take Biaxin 500 mg twice daily for 10 days.  Take prednisone in tapering course going from 60 mg daily once decreasing by 10 mg daily.  Hycodan 1 teaspoon every 8 hours as needed for cough.  Rest and drink plenty of fluids.  Monitor pulse oximetry at home.

## 2020-12-15 NOTE — Progress Notes (Signed)
   Subjective:    Patient ID: [name], female    DOB: 28-Dec-1965, 55 y.o.   MRN: [id number]  HPI 55 year old Female with cough and fever. Has been vaccinated for Covid-19. 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 Had negative Covid test at [facility] on Friday January 7th. Has had myalgias, cough and congestion. History of smoking. Temperature 100- 101 degrees. Took Tylenol and NSAID today. Last week on Tuesday, she had episode of apparrent laryngospasm and had to take mask off for a few minutes. History of CAD.  History of impaired glucose tolerance.  History of hypothyroidism.  Remote history of HS purpura in 2006.  Sees Dr. Greta Doom for chronic pain management.  Has chronic arthralgias.  History of hypertension and GE reflux.  In 2013 she presented with acute anterior wall MI.  Cardiac cath showed subtotal occlusion of a very small diagonal branch with patency of the LAD and no other significant coronary artery disease.  There was suspicion for component of coronary vasospasm.  She was treated medically.  LV function was normal.    Review of Systems see above. No complaint of sore throat. Has had exposure at work to Influenza and [place].     Objective:   Physical Exam She is afebrile. Pharynx slightly injected.TMs clear bilaterally. Chest: Has wheezing LLL. No frank rales. RR rate is normal. Looks fatigued. Pulse ox is WNL.       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 Has had exposure  to a number of respiratory pathogens including Covid-19. Have checked rapid flu test, Covid-19 PCR test and Respiratory virus panel. Results pending. Out of work at least until tests results are back. Take  Hycodan one tsp every 8 hours as needed for cough. Prednisone 10 mg (#21)- take in tapering course as directed 6-5-4-3-2-1. Biaxin 500 mg twice daily x 10 days (#20). Do not take statin while on Biaxin. CXR ordered. May  have pneumonia.   Addendum: rapid flu test is negative.  Coronary artery disease- seen at [facility]  History of smoking  Hypothyroidism treated with thyroid replacement medication  Hypertension treated with antihypertensive medication  History of GE reflux treated with PPI  History of anxiety and depression treated with Pristiq and Xanax  Plan: See above regarding Hycodan, Biaxin, and prednisone treatment.  Respiratory virus panel and COVID-19 test are pending.  Rapid flu test is negative.  Rest and stay well-hydrated.  Monitor temperature.  Quarantine until COVID-19 and respiratory panels are back. Addendum: CXR shows left basilar opacities concerning for pneumonia. Not sure if due to aspiration with recent episode of laryngospasm or pneumonia due to respiratory pathogen such as Covid. Covid 19 test is pending as is respiratory virus panel. Out of work x one week.

## 2020-12-15 NOTE — Telephone Encounter (Signed)
[name] [phone]  [name] caleed to say on Friday she started running fever, was tested for COVID it was negative fever was 101, since then it has been 101 , 100, she has had body aches son SOB, started today with a mild cough and congestion. She stated on Tuesday last week she had the worse Laryno Spasm she has ever had at work and had to take mask off for a few minutes.

## 2020-12-15 NOTE — Telephone Encounter (Signed)
Car visit

## 2020-12-15 NOTE — Telephone Encounter (Signed)
Scheduled at 11:30.

## 2020-12-17 LAB — RESPIRATORY VIRUS PANEL

## 2020-12-17 LAB — SARS-COV-2 RNA,(COVID-19) QUALITATIVE NAAT: SARS CoV2 RNA: NOT DETECTED

## 2020-12-17 NOTE — Telephone Encounter (Signed)
[name] is calling to see if anymore of her test result are back yet?

## 2020-12-23 ENCOUNTER — Other Ambulatory Visit: Payer: Self-pay

## 2020-12-23 DIAGNOSIS — R059 Cough, unspecified: Secondary | ICD-10-CM

## 2020-12-23 DIAGNOSIS — R509 Fever, unspecified: Secondary | ICD-10-CM

## 2020-12-24 ENCOUNTER — Encounter: Payer: Self-pay | Admitting: Internal Medicine

## 2020-12-24 ENCOUNTER — Ambulatory Visit: Payer: 59 | Admitting: Internal Medicine

## 2020-12-24 ENCOUNTER — Ambulatory Visit
Admission: RE | Admit: 2020-12-24 | Discharge: 2020-12-24 | Disposition: A | Discharge status: Home / Self Care | Payer: 59 | Source: Ambulatory Visit | Attending: Internal Medicine | Admitting: Internal Medicine

## 2020-12-24 ENCOUNTER — Other Ambulatory Visit: Payer: Self-pay

## 2020-12-24 ENCOUNTER — Other Ambulatory Visit: Payer: Self-pay | Admitting: Internal Medicine

## 2020-12-24 VITALS — BP 100/62 | HR 87 | Temp 97.9°F | Ht 65.0 in | Wt 198.0 lb

## 2020-12-24 DIAGNOSIS — R509 Fever, unspecified: Secondary | ICD-10-CM

## 2020-12-24 DIAGNOSIS — M2550 Pain in unspecified joint: Secondary | ICD-10-CM | POA: Diagnosis not present

## 2020-12-24 DIAGNOSIS — R918 Other nonspecific abnormal finding of lung field: Secondary | ICD-10-CM

## 2020-12-24 DIAGNOSIS — R059 Cough, unspecified: Secondary | ICD-10-CM

## 2020-12-24 DIAGNOSIS — F17200 Nicotine dependence, unspecified, uncomplicated: Secondary | ICD-10-CM | POA: Diagnosis not present

## 2020-12-24 LAB — POCT GLUCOSE (DEVICE FOR HOME USE): POC Glucose: 154 mg/dl — AB (ref 70–99)

## 2020-12-24 MED ORDER — DOXYCYCLINE HYCLATE 100 MG PO TABS: 100.0000 mg | ORAL_TABLET | Freq: Two times a day (BID) | ORAL | 0 refills | Status: DC

## 2020-12-24 MED ORDER — CEFTRIAXONE SODIUM 1 G IJ SOLR
1.0000 g | Freq: Once | INTRAMUSCULAR | Status: AC
  Administered 2020-12-24: 1 g via INTRAMUSCULAR

## 2020-12-24 NOTE — Patient Instructions (Addendum)
Start doxycycline 100 mg twice daily for 10 days.  1 g IM Rocephin given.  May take Hycodan sparingly in addition to [place] already prescribed by Dr. Greta Doom for chronic pain.  Rest and drink plenty of fluids.  Return in 1 week.  To have CT of chest for further evaluation of the abnormal findings in the left lower lobe.  Labs drawn and pending including Mycoplasma antibody.  Have obtained urine for Legionella DFA.

## 2020-12-24 NOTE — Progress Notes (Signed)
   Subjective:    Patient ID: [name], female    DOB: April 01, 1966, 55 y.o.   MRN: [id number]  HPI She was seen January 10 with fever and cough. Had negative COVID-19 test January 7.  She was seen here January 10.  Apparently had an episode of laryngospasm at work the first week in January and had to take her mask off for a few minutes.  Otherwise has been wearing mask at all times at work.  She had an abnormal chest x-ray on January 10 showing heterogeneous left basilar opacities concerning for infection.  She is on chronic pain management per Dr. Greta Doom.  The arthralgias are concerning to her.  It reminds her of when she had a severe bout of HS purpura. years ago.  We repeated her chest x-ray today.  I have reviewed it and it still seems to me that she has left lower lobe abnormality.  Radiologist read it as a bandlike opacity in the left lower lobe corresponding with the previous visualized left lower lobe opacity, most consistent with evolving sequela of previous infection.  She is not coughing today.  She looks fatigued.  She has been out of work since I saw her on January 10.  She has a history of smoking.   Review of Systems Cough is more productive but can't quite get sputum up. Has diffuse arthralgias that reminds her of HS purpura. Pulse ox improved from 91 % to 96%. Not wheezing. Breathing is easier.  Still fatigued     Objective:   Physical Exam Blood pressure 100/62 pulse 87 temperature 97.9 degrees pulse oximetry 98% weight 198 pounds BMI 32.95 Skin warm and dry.  Chest is clear to auscultation without rales or wheezing.  Not coughing during this visit.      Assessment & Plan:  My feeling is that she had left lower lobe pneumonia whether it be from aspiration or bacterial infection.  She is finishing up course of Biaxin and will be placed on doxycycline 100 mg twice daily for 10 days.  She will remain out of work.  Gave her 1 g IM Rocephin today in the office.  She  is on chronic pain management per Dr. Zannie Cove and has hydrocodone APAP chronically for pain.  She was also given Hycodan for cough at last visit.  Today we obtained urine for Legionella DFA, CCP, rheumatoid factor, ANA, TSH and total CK along with sed rate.  She had CBC with differential on and c-Met drawn.  Antibody obtained for Mycoplasma pneumoniae.  I also want her to have CT of chest for further evaluation of this infiltrate.  She has a history of smoking.  Patient will remain out of work for an additional week.  Follow-up on January 28.  25 minutes spent with patient including discussion regarding chest x-ray findings, clinical findings, and differential diagnosis.

## 2020-12-25 ENCOUNTER — Telehealth: Payer: Self-pay | Admitting: Internal Medicine

## 2020-12-25 ENCOUNTER — Ambulatory Visit (HOSPITAL_COMMUNITY)
Admission: RE | Admit: 2020-12-25 | Discharge: 2020-12-25 | Disposition: A | Discharge status: Home / Self Care | Payer: 59 | Source: Ambulatory Visit | Attending: Internal Medicine | Admitting: Internal Medicine

## 2020-12-25 DIAGNOSIS — I2510 Atherosclerotic heart disease of native coronary artery without angina pectoris: Secondary | ICD-10-CM | POA: Diagnosis not present

## 2020-12-25 DIAGNOSIS — J219 Acute bronchiolitis, unspecified: Secondary | ICD-10-CM | POA: Diagnosis not present

## 2020-12-25 DIAGNOSIS — Z8701 Personal history of pneumonia (recurrent): Secondary | ICD-10-CM | POA: Diagnosis not present

## 2020-12-25 DIAGNOSIS — R918 Other nonspecific abnormal finding of lung field: Secondary | ICD-10-CM | POA: Insufficient documentation

## 2020-12-25 MED ORDER — IOHEXOL 300 MG/ML  SOLN
100.0000 mL | Freq: Once | INTRAMUSCULAR | Status: AC | PRN
  Administered 2020-12-25: 75 mL via INTRAVENOUS

## 2020-12-25 NOTE — Telephone Encounter (Signed)
[name] called she had just finished her CT and she said they had done an IV CT and after they finished they said she did not need to drink the contrast that she had drank before. When [name] scheduled the CT they had her have patient come by and pick up contrast to drink 2 hours before CT and not to drink anything else.  [name] is upset now because she drank the stuff and did not half too, wants to know what to do to get it out of her system. I told her to maybe ask them because they deal with patients that take it on a regular basses. She started crying and said she is nauseas now and will deal with it.

## 2020-12-26 ENCOUNTER — Telehealth: Payer: Self-pay | Admitting: Radiology

## 2020-12-26 NOTE — Telephone Encounter (Signed)
Spoke with patient about this and needs to be referred to Pulmonary- needs appt in about 4 weeks

## 2020-12-29 ENCOUNTER — Other Ambulatory Visit (HOSPITAL_COMMUNITY): Payer: Self-pay | Admitting: Physical Medicine and Rehabilitation

## 2020-12-29 DIAGNOSIS — M542 Cervicalgia: Secondary | ICD-10-CM | POA: Diagnosis not present

## 2020-12-29 DIAGNOSIS — M47817 Spondylosis without myelopathy or radiculopathy, lumbosacral region: Secondary | ICD-10-CM | POA: Diagnosis not present

## 2020-12-29 DIAGNOSIS — M2550 Pain in unspecified joint: Secondary | ICD-10-CM | POA: Diagnosis not present

## 2020-12-29 DIAGNOSIS — G894 Chronic pain syndrome: Secondary | ICD-10-CM | POA: Diagnosis not present

## 2020-12-29 LAB — CK, TOTAL(REFL): Total CK: 79 U/L (ref 29–143)

## 2020-12-29 LAB — CBC WITH DIFFERENTIAL/PLATELET
Absolute Monocytes: 960 cells/uL — ABNORMAL HIGH (ref 200–950)
Basophils Absolute: 50 cells/uL (ref 0–200)
Basophils Relative: 0.5 %
Eosinophils Absolute: 200 cells/uL (ref 15–500)
Eosinophils Relative: 2 %
HCT: 47.2 % — ABNORMAL HIGH (ref 35.0–45.0)
Hemoglobin: 16.2 g/dL — ABNORMAL HIGH (ref 11.7–15.5)
Lymphs Abs: 2410 cells/uL (ref 850–3900)
MCH: 31.7 pg (ref 27.0–33.0)
MCHC: 34.3 g/dL (ref 32.0–36.0)
MCV: 92.4 fL (ref 80.0–100.0)
MPV: 11.2 fL (ref 7.5–12.5)
Monocytes Relative: 9.6 %
Neutro Abs: 6380 cells/uL (ref 1500–7800)
Neutrophils Relative %: 63.8 %
Platelets: 281 10*3/uL (ref 140–400)
RBC: 5.11 10*6/uL — ABNORMAL HIGH (ref 3.80–5.10)
RDW: 11.8 % (ref 11.0–15.0)
Total Lymphocyte: 24.1 %
WBC: 10 10*3/uL (ref 3.8–10.8)

## 2020-12-29 LAB — COMPLETE METABOLIC PANEL WITH GFR
AG Ratio: 1.6 (calc) (ref 1.0–2.5)
ALT: 34 U/L — ABNORMAL HIGH (ref 6–29)
AST: 25 U/L (ref 10–35)
Albumin: 3.8 g/dL (ref 3.6–5.1)
Alkaline phosphatase (APISO): 98 U/L (ref 37–153)
BUN: 12 mg/dL (ref 7–25)
CO2: 29 mmol/L (ref 20–32)
Calcium: 9.3 mg/dL (ref 8.6–10.4)
Chloride: 101 mmol/L (ref 98–110)
Creat: 1 mg/dL (ref 0.50–1.05)
GFR, Est African American: 73 mL/min/{1.73_m2} (ref >=60)
GFR, Est Non African American: 63 mL/min/{1.73_m2} (ref >=60)
Globulin: 2.4 g/dL (calc) (ref 1.9–3.7)
Glucose, Bld: 99 mg/dL (ref 65–99)
Potassium: 3.9 mmol/L (ref 3.5–5.3)
Sodium: 140 mmol/L (ref 135–146)
Total Bilirubin: 0.5 mg/dL (ref 0.2–1.2)
Total Protein: 6.2 g/dL (ref 6.1–8.1)

## 2020-12-29 LAB — HEMOGLOBIN A1C
Hgb A1c MFr Bld: 6 % of total Hgb — ABNORMAL HIGH (ref <5.7)
Mean Plasma Glucose: 126 mg/dL
eAG (mmol/L): 7 mmol/L

## 2020-12-29 LAB — LEGIONELLA ANTIGEN, URINE: Legionella Antigen, Urine: NOT DETECTED

## 2020-12-29 LAB — CYCLIC CITRUL PEPTIDE ANTIBODY, IGG: Cyclic Citrullin Peptide Ab: <16 UNITS

## 2020-12-29 LAB — MYCOPLASMA PNEUMONIAE AB, IGM/IGG
M. pneumoniae Ab, IgG: 1.78 — ABNORMAL HIGH (ref <=0.90)
Mycoplasma pneumo IgM: 89 U/mL (ref <770)

## 2020-12-29 LAB — TSH: TSH: 1.68 mIU/L

## 2020-12-29 LAB — ANA: Anti Nuclear Antibody (ANA): NEGATIVE

## 2020-12-29 LAB — RHEUMATOID FACTOR: Rheumatoid fact SerPl-aCnc: <14 IU/mL (ref <14)

## 2020-12-29 LAB — SEDIMENTATION RATE: Sed Rate: 2 mm/h (ref 0–30)

## 2021-01-02 ENCOUNTER — Telehealth: Payer: Self-pay | Admitting: Internal Medicine

## 2021-01-02 ENCOUNTER — Encounter: Payer: Self-pay | Admitting: Internal Medicine

## 2021-01-02 ENCOUNTER — Ambulatory Visit (INDEPENDENT_AMBULATORY_CARE_PROVIDER_SITE_OTHER): Payer: 59 | Admitting: Internal Medicine

## 2021-01-02 ENCOUNTER — Other Ambulatory Visit: Payer: Self-pay

## 2021-01-02 VITALS — BP 102/70 | HR 74 | Wt 196.0 lb

## 2021-01-02 DIAGNOSIS — B949 Sequelae of unspecified infectious and parasitic disease: Secondary | ICD-10-CM

## 2021-01-02 DIAGNOSIS — Z8679 Personal history of other diseases of the circulatory system: Secondary | ICD-10-CM

## 2021-01-02 DIAGNOSIS — R5383 Other fatigue: Secondary | ICD-10-CM | POA: Diagnosis not present

## 2021-01-02 DIAGNOSIS — R002 Palpitations: Secondary | ICD-10-CM | POA: Diagnosis not present

## 2021-01-02 DIAGNOSIS — R918 Other nonspecific abnormal finding of lung field: Secondary | ICD-10-CM | POA: Diagnosis not present

## 2021-01-02 NOTE — Progress Notes (Signed)
   Subjective:    Patient ID: [name], female    DOB: 1966/11/02, 55 y.o.   MRN: [id number]  HPI 55 year old Female recently diagnosed with LLL pneumonia with 2.9 x 2.3 opacity noted anterior left lower lobe. She has a history of smoking.She has a positive titer to Mycoplasma pneumonia IgG and IgM is negative.Began running fever January 7th up to 101 degrees. Had flu like illness. Covid-19 test was negative. She ha had 3 Covid vaccines. Prior to this, she had laryngospasm and possible aspiration at work on Tuesday January 4th. Had to remove mask for a few minutes to recover.  Pulmonary referral being made in view of CXR results and Hx of smoking.  Review of Systems has extreme fatigue but pulse ox has been normal. Recently had some PACs on phone monitor and possible short runs of SVT. Suggest event monitor. Hx CAD.c/o popping in left ear.     Objective:   Physical Exam VS reviewed. Both TMS obscurred by cerumen. May need to see ENT to remove. Chest is clear. No respiratory distress. Cor: RRR       Assessment & Plan:  Protracted fatigue s/p treatment for pneumonia LLL. 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 Appt with Pulmonary.Was treated with Doxycycline for  10 days beginning Jan 19th. Initially treated with Biaxin and prednisone taper which usually works well for her episodes of bronchitis.  ?SVT vs PACs- Needs Event monitor. Will order.  Hx CAD  Hx smoking- continues to smoke  Anxiety state.  Not sure if she has had acute Mycoplasma pneumonia or lobar(strep vs aspiration pneumonia. Tested negative for Covid-19. Will be seeing Pulmonary soon.Need to  Exclude malignancy with hx of smoking. Fatigue is likely related to pneumonia and may take a few weeks to resolve. Remain out of work an additional week. Pulse ox readings stable and WNL she says. Was started on Doxycycline Jnauray 19th and Biaxin was D.Ced

## 2021-01-02 NOTE — Telephone Encounter (Signed)
Received [employer] FMLA Forms to be completed. Leave of Absence Intake # [id number]

## 2021-01-03 ENCOUNTER — Encounter: Payer: Self-pay | Admitting: Internal Medicine

## 2021-01-03 NOTE — Patient Instructions (Signed)
Finish Doxycycline. Pulmonary referral placed. Out of work an additional week.

## 2021-01-05 NOTE — Telephone Encounter (Signed)
Faxed completed forms to [employer] [phone] Intake [id number]

## 2021-01-06 ENCOUNTER — Other Ambulatory Visit: Payer: Self-pay

## 2021-01-06 DIAGNOSIS — Z029 Encounter for administrative examinations, unspecified: Secondary | ICD-10-CM

## 2021-01-06 DIAGNOSIS — R002 Palpitations: Secondary | ICD-10-CM

## 2021-01-07 ENCOUNTER — Telehealth: Payer: Self-pay | Admitting: Internal Medicine

## 2021-01-07 NOTE — Telephone Encounter (Signed)
Received Short Term Disability Forms on 01/05/2021  Completed and attempted to fax forms on 01/06/2021 several times to [phone] after at least 6 failures I called and got another fax number [phone] after numerous failure to both numbers I sent the completed documents to email [email or web address] Received conformation that they were received.   Claim # [id number]

## 2021-01-09 ENCOUNTER — Encounter: Payer: Self-pay | Admitting: Internal Medicine

## 2021-01-09 ENCOUNTER — Ambulatory Visit (INDEPENDENT_AMBULATORY_CARE_PROVIDER_SITE_OTHER): Payer: 59 | Admitting: Internal Medicine

## 2021-01-09 ENCOUNTER — Other Ambulatory Visit: Payer: Self-pay

## 2021-01-09 VITALS — BP 110/80 | HR 81 | Ht 65.0 in | Wt 198.0 lb

## 2021-01-09 DIAGNOSIS — J189 Pneumonia, unspecified organism: Secondary | ICD-10-CM | POA: Diagnosis not present

## 2021-01-15 NOTE — Progress Notes (Signed)
Subjective:    Patient ID: [name], female    DOB: 1966-10-23, 55 y.o.   MRN: [id number]  HPI 55 year old Female seen for follow up on left lower lobe pneumonia diagnosed January 10.  Had negative COVID-19 test through health at work on Friday, January 7.  Had myalgias cough and congestion.  Temperature was 100 to 101 degrees.  During the week of January 2, she was at work and apparently had an episode of laryngeal spasm with possible aspiration.  Had to take her mask off for a few minutes.  She is a smoker.  History of coronary artery disease.  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  Cardiac cath showed subtotal occlusion of a very small diagonal branch with patency of the LAD and no other significant coronary artery disease.  There was suspicion for component of coronary vasospasm.  She was treated medically.  LV function was normal.  When seen initially for respiratory infection symptoms on January 10 she was treated with Hycodan Biaxin and prednisone.  Rapid flu test was negative.  Respiratory virus panel was negative.  She was reevaluated on January 19.  Was not feeling a lot better and was having myalgias which reminded her of the severe bout of at bedtime purpura she had years ago treated at [facility].  Chest x-ray was repeated on January 19 patient had bandlike opacity in the left lower lobe corresponding with previous visualized left lower lobe opacity.  Subsequently because she was a smoker and was slow to improve, I decided to do a chest CT with contrast which was performed on January 20.  There was a somewhat nodular heterogeneous opacity in the anterior left lower lobe measuring 2.9 x 2.3 cm .  Follow-up CT was recommended in 3 months as malignancy could not be excluded.  There was a fine centrilobular nodularity throughout the lungs seen in smoking-related respiratory bronchiolitis according to the radiologist.  On January 19  she was given 1 g IM Rocephin and Hycodan for cough.  She was finishing a course of Biaxin and was switched to doxycycline 100 mg twice daily for 10 days.  A number of lab studies were obtained on January 19 including Legionella DFA, CCP, rheumatoid factor, ANA, TSH and total CK along with sed rate.  Antibody obtained for Mycoplasma pneumoniae.  Was taken out of work until January 28.  She had slight elevation of SGPT at 34.  Hemoglobin A1c was 6.0%, hemoglobin was 16.2 g, white blood cell count 10,000, and platelet count was 281,000.  Sed rate was 2.  ANA was negative.  TSH was normal.  Rheumatoid factor was negative and CCPwas normal.  Urine Legionella antigen was negative however, she had a positive Mycoplasma pneumoniae titer IgG of 1.78 but IgM was 89.  By that time she had been ill since the first week in January.  It is possible she had Mycoplasma pneumonia.  In any case she has a Pulmonary consultation pending in the near future on February 21 with Dr. Valeta Harms.  She is feeling better.  We talked about returning to work.  The job is not easy.  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  She has some situational stress with her father who lives alone.    Review of Systems see above     Objective:   Physical Exam Blood pressure 110/80, pulse 81, pulse oximetry 96% weight 198 pounds BMI  32.95  Skin is warm and dry.  No cervical adenopathy.  Chest is clear to auscultation.  Cardiac exam: Regular rate and rhythm normal S1 and S2.  No lower extremity edema.       Assessment & Plan:  Left lower lobe pneumonia-slow to resolve.  She had abnormal chest CT.  She has a history of smoking and is a current smoker.  She has a history of coronary artery disease.  She will see Dr. Icad, pulmonologist February 21.  Note given for her to return to work.  She will let me know if she is having difficulty working her regular schedule.  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  However, I would like to exclude an underlying malignancy with her recent pneumonia.  Defer further imaging to pulmonologist.

## 2021-01-15 NOTE — Patient Instructions (Addendum)
Note given to return to work.  See Dr. Valeta Harms February 21.  Please stop smoking.  Follow-up with me for 23-month recheck in March.

## 2021-01-26 ENCOUNTER — Encounter: Payer: Self-pay | Admitting: Pulmonary Disease

## 2021-01-26 ENCOUNTER — Other Ambulatory Visit: Payer: Self-pay

## 2021-01-26 ENCOUNTER — Ambulatory Visit: Payer: 59 | Admitting: Pulmonary Disease

## 2021-01-26 VITALS — BP 132/80 | HR 98 | Temp 97.3°F | Ht 65.0 in | Wt 195.4 lb

## 2021-01-26 DIAGNOSIS — R911 Solitary pulmonary nodule: Secondary | ICD-10-CM

## 2021-01-26 DIAGNOSIS — F17210 Nicotine dependence, cigarettes, uncomplicated: Secondary | ICD-10-CM | POA: Diagnosis not present

## 2021-01-26 DIAGNOSIS — D690 Allergic purpura: Secondary | ICD-10-CM | POA: Diagnosis not present

## 2021-01-26 DIAGNOSIS — J189 Pneumonia, unspecified organism: Secondary | ICD-10-CM

## 2021-01-26 DIAGNOSIS — Z720 Tobacco use: Secondary | ICD-10-CM

## 2021-01-26 NOTE — Progress Notes (Signed)
Synopsis: Referred in Feb 2022 for abnormal ct chest by Elby Showers, MD  Subjective:   PATIENT ID: [name] GENDER: female DOB: 01/02/1966, MRN: [id number]  Chief Complaint  Patient presents with  . Consult    No complaints currently---here to review CT scan    55 year old female, past medical history of coronary vasospasm presented with a acute anterior STEMI, chronic kidney disease history of Henoch-Schnlein purpura, leukocytoclastic vasculitis in 2006, hypertension, hypothyroidism.  Presents today after a respiratory tract infection type symptoms in the first part of January, fever shortness of breath.  She did not have much cough did not have any significant sputum production.  She was treated with antibiotics.  Patient was treated with doxycycline and steroids.  She had a chest x-ray with a lower lobe opacity which led to a CT scan of the chest on 12/25/2020 which revealed a 2.9 x 2.3 nodular opacity with visible airway going through this and surrounding groundglass.  Patient has no significant respiratory symptoms at this time.  She is however a current smoker.  She has smoked for 25+ years at approximately half a pack a day, her highest at 1 pack/day.  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   Past Medical History:  Diagnosis Date  . ADHD (attention deficit hyperactivity disorder)   . Anxiety   . Asthma   . CAD (coronary artery disease)    a. 11/2012 Acute Anterior STEMI/Cath: Nonobs dzs except subtotal occlusion of small, 53mm D2, EF 55-65%;  b. 11/2012  Echo: EF 60-65%, nl wall motion, Gr 2 DD.  Marland Kitchen Chronic kidney disease    in setting of HSP 2006  . Coronary vasospasm (HCC)    a. presumed  . Depression   . GERD (gastroesophageal reflux disease)   . 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 Henoch-Schonlein purpura ([place])    HSP tx at [facility] in 2006 (also h/o  leukocytoclastic vasculitis per chart 2006)  . Hypertension    Controlled - she states she is on BP meds for HSP  . Hypothyroidism   . Migraines   . Sleep apnea    "lost 50#; no problems w/it now" (11/22/2012)  . 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 Tobacco abuse      Family History  Problem Relation Age of Onset  . Stroke Father   . Hypertension Mother   . Heart disease Neg Hx      Past Surgical History:  Procedure Laterality Date  . APPENDECTOMY  1970's  . CARDIAC CATHETERIZATION  11/22/2012   "first one" (11/22/2012)  . [place]; 1996; 1999  . DILATION AND CURETTAGE OF UTERUS  1990's  . LEFT HEART CATHETERIZATION WITH CORONARY ANGIOGRAM N/A 11/22/2012   Procedure: LEFT HEART CATHETERIZATION WITH CORONARY ANGIOGRAM;  Surgeon: Sherren Mocha, MD;  Location: [facility] CATH LAB;  Service: Cardiovascular;  Laterality: N/A;    Social History   Socioeconomic History  . Marital status: Married    Spouse name: Not on file  . Number of children: Not on file  . Years of education: Not on file  . Highest education level: Not on file  Occupational History  . Occupation: RN/UC    Employer: [employer]    Comment: [employer]  Tobacco Use  . Smoking status: Current Every Day Smoker    Packs/day: 0.50    Years: 25.00    Pack years: 12.50    Types: Cigarettes  . Smokeless tobacco: Never Used  . Tobacco comment: smokes 1/2 pack per day 01/26/2021  Vaping Use  . Vaping Use: Never used  Substance and Sexual Activity  . Alcohol use: No  . Drug use: No  . Sexual activity: Yes  Other Topics Concern  . Not on file  Social History Narrative  . Not on file   Social Determinants of Health   Financial Resource Strain: Not on file  Food Insecurity: Not on file  Transportation Needs: Not on file  Physical Activity: Not on file  Stress: Not on file  Social Connections: Not on file   Intimate Partner Violence: Not on file     Allergies  Allergen Reactions  . Levaquin [Levofloxacin] Hives  . Nsaids Other (See Comments)    Able to take aspirin "Had HSP; since then they treat my kidneys very nicely" (11/22/2012)     Outpatient Medications Prior to Visit  Medication Sig Dispense Refill  . albuterol (VENTOLIN HFA) 108 (90 Base) MCG/ACT inhaler Inhale 2 puffs into the lungs every 6 (six) hours as needed for wheezing or shortness of breath. 8 g 11  . ALPRAZolam (XANAX) 0.5 MG tablet Take 1 tablet (0.5 mg total) by mouth 2 (two) times daily as needed for anxiety. 60 tablet 1  . amLODipine (NORVASC) 5 MG tablet TAKE 1 TABLET (5 MG TOTAL) BY MOUTH DAILY. 90 tablet 3  . aspirin EC 81 MG EC tablet Take 1 tablet (81 mg total) by mouth daily.    Marland Kitchen atorvastatin (LIPITOR) 40 MG tablet TAKE 1 TABLET BY MOUTH DAILY AT 6:00 PM 90 tablet 3  . calcium-vitamin D (OSCAL WITH D) 500-200 MG-UNIT per tablet Take 1 tablet by mouth daily.    . Cholecalciferol (VITAMIN D3) 2000 units TABS Take 2,000 Units by mouth daily.    Marland Kitchen desvenlafaxine (PRISTIQ) 50 MG 24 hr tablet TAKE 1 TABLET (50 MG TOTAL) BY MOUTH DAILY. 90 tablet 2  . enalapril (VASOTEC) 10 MG tablet TAKE 1 TABLET (10 MG TOTAL) BY MOUTH DAILY. 90 tablet 2  . HYDROcodone-acetaminophen (NORCO/VICODIN) 5-325 MG tablet Take 1 tablet by mouth 2 (two) times daily as needed.    Marland Kitchen levothyroxine (SYNTHROID) 75 MCG tablet TAKE 1 TABLET (75 MCG TOTAL) BY MOUTH DAILY. 90 tablet 1  . loratadine (CLARITIN) 10 MG tablet Take 10 mg by mouth daily.    . nitroGLYCERIN (NITROSTAT) 0.4 MG SL tablet Place 1 tablet (0.4 mg total) under the tongue every 5 (five) minutes as needed for chest pain. 25 tablet 3  . pantoprazole (PROTONIX) 40 MG tablet TAKE 1 TABLET BY MOUTH TWICE A DAY BEFORE A MEAL. 180 tablet 3  . HYDROcodone-homatropine (HYCODAN) 5-1.5 MG/5ML syrup Take 5 mLs by mouth every 8 (eight) hours as needed for cough. (Patient not taking: Reported on  01/26/2021) 120 mL 0  . ketoconazole (NIZORAL) 2 % cream Apply 1 application topically daily. (Patient not taking: Reported on 01/26/2021) 15 g 0   No facility-administered medications prior to visit.    Review of Systems  Constitutional: Positive for malaise/fatigue. Negative for chills, fever and weight loss.  HENT: Negative for hearing loss, sore throat and tinnitus.   Eyes: Negative for blurred vision and double vision.  Respiratory: Negative for cough, hemoptysis, sputum production, shortness of breath, wheezing and stridor.  Cardiovascular: Negative for chest pain, palpitations, orthopnea, leg swelling and PND.  Gastrointestinal: Negative for abdominal pain, constipation, diarrhea, heartburn, nausea and vomiting.  Genitourinary: Negative for dysuria, hematuria and urgency.  Musculoskeletal: Positive for joint pain. Negative for myalgias.  Skin: Negative for itching and rash.  Neurological: Negative for dizziness, tingling, weakness and headaches.  Endo/Heme/Allergies: Negative for environmental allergies. Does not bruise/bleed easily.  Psychiatric/Behavioral: Negative for depression. The patient is not nervous/anxious and does not have insomnia.   All other systems reviewed and are negative.    Objective:  Physical Exam Vitals reviewed.  Constitutional:      General: She is not in acute distress.    Appearance: She is well-developed and well-nourished.  HENT:     Head: Normocephalic and atraumatic.     Mouth/Throat:     Mouth: Oropharynx is clear and moist.  Eyes:     General: No scleral icterus.    Conjunctiva/sclera: Conjunctivae normal.     Pupils: Pupils are equal, round, and reactive to light.  Neck:     Vascular: No JVD.     Trachea: No tracheal deviation.  Cardiovascular:     Rate and Rhythm: Normal rate and regular rhythm.     Pulses: Intact distal pulses.     Heart sounds: Normal heart sounds. No murmur heard.   Pulmonary:     Effort: Pulmonary effort is  normal. No tachypnea, accessory muscle usage or respiratory distress.     Breath sounds: Normal breath sounds. No stridor. No wheezing, rhonchi or rales.  Abdominal:     General: Bowel sounds are normal. There is no distension.     Palpations: Abdomen is soft.     Tenderness: There is no abdominal tenderness.  Musculoskeletal:        General: No tenderness or edema.     Cervical back: Neck supple.  Lymphadenopathy:     Cervical: No cervical adenopathy.  Skin:    General: Skin is warm and dry.     Capillary Refill: Capillary refill takes less than 2 seconds.     Findings: No rash.  Neurological:     Mental Status: She is alert and oriented to person, place, and time.  Psychiatric:        Mood and Affect: Mood and affect normal.        Behavior: Behavior normal.      Vitals:   01/26/21 1436  BP: 132/80  Pulse: 98  Temp: (!) 97.3 F (36.3 C)  TempSrc: Temporal  SpO2: 97%  Weight: 195 lb 6.4 oz (88.6 kg)  Height: 5\' 5"  (1.651 m)   97% on RA BMI Readings from Last 3 Encounters:  01/26/21 32.52 kg/m  01/09/21 32.95 kg/m  01/02/21 32.62 kg/m   Wt Readings from Last 3 Encounters:  01/26/21 195 lb 6.4 oz (88.6 kg)  01/09/21 198 lb (89.8 kg)  01/02/21 196 lb (88.9 kg)     CBC    Component Value Date/Time   WBC 10.0 12/24/2020 1242   RBC 5.11 (H) 12/24/2020 1242   HGB 16.2 (H) 12/24/2020 1242   HCT 47.2 (H) 12/24/2020 1242   PLT 281 12/24/2020 1242   MCV 92.4 12/24/2020 1242   MCH 31.7 12/24/2020 1242   MCHC 34.3 12/24/2020 1242   RDW 11.8 12/24/2020 1242   LYMPHSABS 2,410 12/24/2020 1242   MONOABS 518 06/21/2017 1014   EOSABS 200 12/24/2020 1242   BASOSABS 50 12/24/2020 1242   Results for [facility] [name] (MRN [id number]) as  of 01/26/2021 15:15  Ref. Range 12/24/2020 12:42  Sed Rate Latest Ref Range: 0 - 30 mm/h 2  eAG (mmol/L) Latest Units: mmol/L 7.0  Glucose Latest Ref Range: 65 - 99 mg/dL 99  Hemoglobin A1C Latest Ref Range: <5.7 % of total  Hgb 6.0 (H)  TSH Latest Units: mIU/L 1.68  Anti Nuclear Antibody (ANA) Latest Ref Range: NEGATIVE  NEGATIVE  Cyclic Citrullin Peptide Ab Latest Units: UNITS <16  RA Latex Turbid. Latest Ref Range: <14 IU/mL <14  Albumin MSPROF Latest Ref Range: 3.6 - 5.1 g/dL 3.8  Mycoplasma pneumo IgM Latest Ref Range: <770 U/mL 89  Legionella Antigen, Urine Latest Ref Range: Not Detect  Not Detected    Chest Imaging: 12/25/2020 CT chest: 2.9 x 2.3 cm nodular opacity left lower lobe.  This looks more inflammatory to me than malignancy.  There is a airway visible throughout the lesion.  Scattered areas of centrilobular nodules. The patient's images have been independently reviewed by me.    Pulmonary Functions Testing Results: No flowsheet data found.  FeNO:   Pathology:   Echocardiogram:   Heart Catheterization:     Assessment & Plan:     ICD-10-CM   1. Community acquired pneumonia of left lower lobe of lung  J18.9   2. Lung nodule  R91.1 CT Chest Wo Contrast  3. Tobacco abuse  Z72.0   4. HSP (Henoch Schonlein purpura) ([place])  D69.0     Discussion:  This is a 55 year old female history of Henoch-Schnlein purpura, leukocytoclastic vasculitis in 2006, current every day smoker.  Has a respiratory illness with associated fever and likely community-acquired left lower lobe pneumonia.  Had a nodule appear on chest x-ray with subsequent CT imaging.  Plan: Due to the size and appearance of the nodule recommend close follow-up. I have ordered a repeat noncontrasted CT scan of the chest to be completed in 6 weeks from initial CT image to ensure resolution. I suspect this is an inflammatory lesion. Due to her history of previous vasculitis she has already had extensive amount of work-up ordered by PCP.  Lab results today have been reviewed  We will have her follow-up in 3 weeks after CT scan is complete.  I will be out of the office at that time and have her follow-up with one of her APP's.  Patient  is agreeable to this plan.   Current Outpatient Medications:  .  albuterol (VENTOLIN HFA) 108 (90 Base) MCG/ACT inhaler, Inhale 2 puffs into the lungs every 6 (six) hours as needed for wheezing or shortness of breath., Disp: 8 g, Rfl: 11 .  ALPRAZolam (XANAX) 0.5 MG tablet, Take 1 tablet (0.5 mg total) by mouth 2 (two) times daily as needed for anxiety., Disp: 60 tablet, Rfl: 1 .  amLODipine (NORVASC) 5 MG tablet, TAKE 1 TABLET (5 MG TOTAL) BY MOUTH DAILY., Disp: 90 tablet, Rfl: 3 .  aspirin EC 81 MG EC tablet, Take 1 tablet (81 mg total) by mouth daily., Disp: , Rfl:  .  atorvastatin (LIPITOR) 40 MG tablet, TAKE 1 TABLET BY MOUTH DAILY AT 6:00 PM, Disp: 90 tablet, Rfl: 3 .  calcium-vitamin D (OSCAL WITH D) 500-200 MG-UNIT per tablet, Take 1 tablet by mouth daily., Disp: , Rfl:  .  Cholecalciferol (VITAMIN D3) 2000 units TABS, Take 2,000 Units by mouth daily., Disp: , Rfl:  .  desvenlafaxine (PRISTIQ) 50 MG 24 hr tablet, TAKE 1 TABLET (50 MG TOTAL) BY MOUTH DAILY., Disp: 90 tablet, Rfl: 2 .  enalapril (VASOTEC) 10 MG tablet, TAKE 1 TABLET (10 MG TOTAL) BY MOUTH DAILY., Disp: 90 tablet, Rfl: 2 .  HYDROcodone-acetaminophen (NORCO/VICODIN) 5-325 MG tablet, Take 1 tablet by mouth 2 (two) times daily as needed., Disp: , Rfl:  .  levothyroxine (SYNTHROID) 75 MCG tablet, TAKE 1 TABLET (75 MCG TOTAL) BY MOUTH DAILY., Disp: 90 tablet, Rfl: 1 .  loratadine (CLARITIN) 10 MG tablet, Take 10 mg by mouth daily., Disp: , Rfl:  .  nitroGLYCERIN (NITROSTAT) 0.4 MG SL tablet, Place 1 tablet (0.4 mg total) under the tongue every 5 (five) minutes as needed for chest pain., Disp: 25 tablet, Rfl: 3 .  pantoprazole (PROTONIX) 40 MG tablet, TAKE 1 TABLET BY MOUTH TWICE A DAY BEFORE A MEAL., Disp: 180 tablet, Rfl: 3 .  HYDROcodone-homatropine (HYCODAN) 5-1.5 MG/5ML syrup, Take 5 mLs by mouth every 8 (eight) hours as needed for cough. (Patient not taking: Reported on 01/26/2021), Disp: 120 mL, Rfl: 0 .  ketoconazole  (NIZORAL) 2 % cream, Apply 1 application topically daily. (Patient not taking: Reported on 01/26/2021), Disp: 15 g, Rfl: 0  I spent 45 minutes dedicated to the care of this patient on the date of this encounter to include pre-visit review of records, face-to-face time with the patient discussing conditions above, post visit ordering of testing, clinical documentation with the electronic health record, making appropriate referrals as documented, and communicating necessary findings to members of the patients care team.   Garner Nash, DO [facility] 01/26/2021 2:43 PM

## 2021-01-26 NOTE — Patient Instructions (Addendum)
Thank you for visiting Dr. Valeta Harms at [facility]. Today we recommend the following:  Orders Placed This Encounter  Procedures  . CT Chest Wo Contrast   CT scheduled in 2 weeks.   Return in about 3 weeks (around 02/16/2021) for with APP or Dr. Valeta Harms.     You must quit smoking or vaping. This is the single most important thing that you can do to improve your lung health.   S = Set a quit date. T = Tell family, friends, and the people around you that you plan to quit. A = Anticipate or plan ahead for the tough times you'll face while quitting. R = Remove cigarettes and other tobacco products from your home, car, and work T = Talk to [place] about getting help to quit  If you need help feel free to reach out to our office, [place] [facility]essation [facility]: [phone], call 1-800-QUIT-NOW, or visit [email or web address].marshall.[email or web address]/.   Smoking Tobacco Information, Adult Smoking tobacco can be harmful to your health. Tobacco contains a poisonous (toxic), colorless chemical called nicotine. Nicotine is addictive. It changes the brain and can make it hard to stop smoking. Tobacco also has other toxic chemicals that can hurt your body and raise your risk of many cancers. How can smoking tobacco affect me? Smoking tobacco puts you at risk for:  Cancer. Smoking is most commonly associated with lung cancer, but can also lead to cancer in other parts of the body.  Chronic obstructive pulmonary disease (COPD). This is a long-term lung condition that makes it hard to breathe. It also gets worse over time.  High blood pressure (hypertension), heart disease, stroke, or heart attack.  Lung infections, such as pneumonia.  Cataracts. This is when the lenses in the eyes become clouded.  Digestive problems. This may include peptic ulcers, heartburn, and gastroesophageal reflux disease (GERD).  Oral health problems, such as gum disease and tooth loss.  Loss of taste and smell. Smoking can  affect your appearance by causing:  Wrinkles.  Yellow or stained teeth, fingers, and fingernails. Smoking tobacco can also affect your social life, because:  It may be challenging to find places to smoke when away from home. Many workplaces, [employer], hotels, and public places are tobacco-free.  Smoking is expensive. This is due to the cost of tobacco and the long-term costs of treating health problems from smoking.  Secondhand smoke may affect those around you. Secondhand smoke can cause lung cancer, breathing problems, and heart disease. Children of smokers have a higher risk for: ? Sudden infant death syndrome (SIDS). ? Ear infections. ? Lung infections. If you currently smoke tobacco, quitting now can help you:  Lead a longer and healthier life.  Look, smell, breathe, and feel better over time.  Save money.  Protect others from the harms of secondhand smoke. What actions can I take to prevent health problems? Quit smoking  Do not start smoking. Quit if you already do.  Make a plan to quit smoking and commit to it. Look for programs to help you and ask your health care provider for recommendations and ideas.  Set a date and write down all the reasons you want to quit.  Let your friends and family know you are quitting so they can help and support you. Consider finding friends who also want to quit. It can be easier to quit with someone else, so that you can support each other.  Talk with your health care provider about using nicotine replacement  medicines to help you quit, such as gum, lozenges, patches, sprays, or pills.  Do not replace cigarette smoking with electronic cigarettes, which are commonly called e-cigarettes. The safety of e-cigarettes is not known, and some may contain harmful chemicals.  If you try to quit but return to smoking, stay positive. It is common to slip up when you first quit, so take it one day at a time.  Be prepared for cravings. When you  feel the urge to smoke, chew gum or suck on hard candy.   Lifestyle  Stay busy and take care of your body.  Drink enough fluid to keep your urine pale yellow.  Get plenty of exercise and eat a healthy diet. This can help prevent weight gain after quitting.  Monitor your eating habits. Quitting smoking can cause you to have a larger appetite than when you smoke.  Find ways to relax. Go out with friends or family to a movie or a restaurant where people do not smoke.  Ask your health care provider about having regular tests (screenings) to check for cancer. This may include blood tests, imaging tests, and other tests.  Find ways to manage your stress, such as meditation, yoga, or exercise. Where to find support To get support to quit smoking, consider:  Asking your health care provider for more information and resources.  Taking classes to learn more about quitting smoking.  Looking for local organizations that offer resources about quitting smoking.  Joining a support group for people who want to quit smoking in your local community.  Calling the smokefree.gov counselor helpline: 1-800-Quit-Now (256)514-3850) Where to find more information You may find more information about quitting smoking from:  HelpGuide.org: www.helpguide.org  https://hall.com/: smokefree.gov  American Lung Association: www.lung.org Contact a health care provider if you:  Have problems breathing.  Notice that your lips, nose, or fingers turn blue.  Have chest pain.  Are coughing up blood.  Feel faint or you pass out.  Have other health changes that cause you to worry. Summary  Smoking tobacco can negatively affect your health, the health of those around you, your finances, and your social life.  Do not start smoking. Quit if you already do. If you need help quitting, ask your health care provider.  Think about joining a support group for people who want to quit smoking in your local community.  There are many effective programs that will help you to quit this behavior. This information is not intended to replace advice given to you by your health care provider. Make sure you discuss any questions you have with your health care provider. Document Revised: 08/17/2019 Document Reviewed: 12/07/2016 Elsevier Patient Education  2021 Short.        (3-10 mins, [id number]) (>85mins, [id number])   Please do your part to reduce the spread of COVID-19.

## 2021-01-26 NOTE — Progress Notes (Signed)
Smoking Cessation Counseling:   The patient's current tobacco use: 25 years, 0.5ppd, highest 1ppd The patient was advised to quit and impact of smoking on their health.  I assessed the patient's willingness to attempt to quit. I provided methods and skills for cessation. We reviewed medication management of smoking session drugs if appropriate. Resources to help quit smoking were provided. A smoking cessation quit date was no set. Patient not seemingly ready.  Follow-up was arranged in our clinic.  The amount of time spent counseling patient was 4 mins.    Garner Nash, DO [facility] 01/26/2021 2:51 PM

## 2021-01-29 ENCOUNTER — Other Ambulatory Visit: Payer: Self-pay | Admitting: Internal Medicine

## 2021-01-30 ENCOUNTER — Encounter (INDEPENDENT_AMBULATORY_CARE_PROVIDER_SITE_OTHER): Payer: 59

## 2021-01-30 DIAGNOSIS — R002 Palpitations: Secondary | ICD-10-CM | POA: Diagnosis not present

## 2021-02-02 DIAGNOSIS — H2513 Age-related nuclear cataract, bilateral: Secondary | ICD-10-CM | POA: Diagnosis not present

## 2021-02-02 DIAGNOSIS — H43821 Vitreomacular adhesion, right eye: Secondary | ICD-10-CM | POA: Diagnosis not present

## 2021-02-02 DIAGNOSIS — H04123 Dry eye syndrome of bilateral lacrimal glands: Secondary | ICD-10-CM | POA: Diagnosis not present

## 2021-02-02 DIAGNOSIS — H40013 Open angle with borderline findings, low risk, bilateral: Secondary | ICD-10-CM | POA: Diagnosis not present

## 2021-02-02 DIAGNOSIS — G43109 Migraine with aura, not intractable, without status migrainosus: Secondary | ICD-10-CM | POA: Diagnosis not present

## 2021-02-10 ENCOUNTER — Ambulatory Visit (INDEPENDENT_AMBULATORY_CARE_PROVIDER_SITE_OTHER)
Admission: RE | Admit: 2021-02-10 | Discharge: 2021-02-10 | Disposition: A | Discharge status: Home / Self Care | Payer: 59 | Source: Ambulatory Visit | Attending: Pulmonary Disease | Admitting: Pulmonary Disease

## 2021-02-10 ENCOUNTER — Other Ambulatory Visit: Payer: Self-pay

## 2021-02-10 DIAGNOSIS — R911 Solitary pulmonary nodule: Secondary | ICD-10-CM

## 2021-02-10 DIAGNOSIS — I700 Atherosclerosis of aorta: Secondary | ICD-10-CM | POA: Diagnosis not present

## 2021-02-10 DIAGNOSIS — I2510 Atherosclerotic heart disease of native coronary artery without angina pectoris: Secondary | ICD-10-CM | POA: Diagnosis not present

## 2021-02-19 ENCOUNTER — Other Ambulatory Visit: Payer: 59 | Admitting: Internal Medicine

## 2021-02-19 ENCOUNTER — Other Ambulatory Visit: Payer: Self-pay

## 2021-02-19 ENCOUNTER — Ambulatory Visit: Payer: 59 | Admitting: Adult Health

## 2021-02-19 ENCOUNTER — Encounter: Payer: Self-pay | Admitting: Adult Health

## 2021-02-19 VITALS — BP 110/70 | HR 71 | Temp 97.5°F | Ht 65.0 in | Wt 194.0 lb

## 2021-02-19 DIAGNOSIS — Z720 Tobacco use: Secondary | ICD-10-CM

## 2021-02-19 DIAGNOSIS — I10 Essential (primary) hypertension: Secondary | ICD-10-CM

## 2021-02-19 DIAGNOSIS — J4530 Mild persistent asthma, uncomplicated: Secondary | ICD-10-CM | POA: Diagnosis not present

## 2021-02-19 DIAGNOSIS — J189 Pneumonia, unspecified organism: Secondary | ICD-10-CM | POA: Diagnosis not present

## 2021-02-19 DIAGNOSIS — R911 Solitary pulmonary nodule: Secondary | ICD-10-CM | POA: Diagnosis not present

## 2021-02-19 DIAGNOSIS — R7302 Impaired glucose tolerance (oral): Secondary | ICD-10-CM | POA: Diagnosis not present

## 2021-02-19 NOTE — Progress Notes (Signed)
@Patient  ID: [name], female    DOB: January 24, 1966, 55 y.o.   MRN: [id number]  Chief Complaint  Patient presents with  . Follow-up    Referring provider: Elby Showers, MD  HPI: 55 year old female active smoker followed for chronic bronchitis Medical history is significant for coronary vasospasm with STEMI, chronic kidney disease with history of Henoch Schoenlein purpura fingernail, leukocytoclastic vasculitis in 2006    TEST/EVENTS :  CT chest December 25, 2020 showed 2.9 x 2.3 left lower lobe nodule    02/19/2021 Follow up : Chronic bronchitis, pneumonia, lung nodule Patient returns for 3-week follow-up.  Patient was seen last visit after an acute respiratory illness she was treated for an acute bronchitis with doxycycline and steroids.  Chest x-ray showed a possible pneumonia with a left lower lobe opacity.  CT scan showed a 2.9 x 2.3 nodular opacity.  Follow-up CT chest showed near complete resolution of the focal nodular opacity in the left lower lung.  This is compatible with a resolving infection/inflammation.   Since last visit patient is feeling much better.  Cough and congestion have decreased.  No further fevers.  Patient does continue to smoke.  Smoking cessation was discussed.  Patient says that her says she has a cough whether she is sick or not.  She is on ACE inhibitor.   Allergies  Allergen Reactions  . Levaquin [Levofloxacin] Hives  . Nsaids Other (See Comments)    Able to take aspirin "Had HSP; since then they treat my kidneys very nicely" (11/22/2012)    Immunization History  Administered Date(s) Administered  . Influenza Split 09/11/2013  . Influenza,inj,Quad PF,6+ Mos 09/24/2018, 09/06/2019, 08/25/2020  . Influenza-Unspecified 10/06/2012, 09/05/2014, 09/01/2015  . PFIZER(Purple Top)SARS-COV-2 Vaccination 12/24/2019, 01/14/2020    Past Medical History:  Diagnosis Date  . ADHD (attention deficit hyperactivity disorder)   . Anxiety   .  Asthma   . CAD (coronary artery disease)    a. 11/2012 Acute Anterior STEMI/Cath: Nonobs dzs except subtotal occlusion of small, 6mm D2, EF 55-65%;  b. 11/2012  Echo: EF 60-65%, nl wall motion, Gr 2 DD.  Marland Kitchen Chronic kidney disease    in setting of HSP 2006  . Coronary vasospasm (HCC)    a. presumed  . Depression   . GERD (gastroesophageal reflux disease)   . 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 Henoch-Schonlein purpura ([place])    HSP tx at [facility] in 2006 (also h/o leukocytoclastic vasculitis per chart 2006)  . Hypertension    Controlled - she states she is on BP meds for HSP  . Hypothyroidism   . Migraines   . Sleep apnea    "lost 50#; no problems w/it now" (11/22/2012)  . 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 Tobacco abuse     Tobacco History: Social History   Tobacco Use  Smoking Status Current Every Day Smoker  . Packs/day: 0.50  . Years: 25.00  . Pack years: 12.50  . Types: Cigarettes  Smokeless Tobacco Never Used  Tobacco Comment   smokes 1/2 pack per day 01/26/2021   Ready to quit: Not Answered Counseling given: Not Answered Comment: smokes 1/2 pack per day 01/26/2021   Outpatient Medications Prior to Visit  Medication Sig Dispense Refill  . albuterol (VENTOLIN HFA) 108 (90 Base) MCG/ACT inhaler Inhale 2 puffs into the lungs every 6 (six) hours as needed  for wheezing or shortness of breath. 8 g 11  . ALPRAZolam (XANAX) 0.5 MG tablet Take 1 tablet (0.5 mg total) by mouth 2 (two) times daily as needed for anxiety. 60 tablet 1  . amLODipine (NORVASC) 5 MG tablet TAKE 1 TABLET (5 MG TOTAL) BY MOUTH DAILY. 90 tablet 3  . aspirin EC 81 MG EC tablet Take 1 tablet (81 mg total) by mouth daily.    Marland Kitchen atorvastatin (LIPITOR) 40 MG tablet TAKE 1 TABLET BY MOUTH DAILY AT 6:00 PM 90 tablet 3  . calcium-vitamin D (OSCAL WITH D) 500-200 MG-UNIT per tablet Take 1 tablet by mouth  daily.    . Cholecalciferol (VITAMIN D3) 2000 units TABS Take 2,000 Units by mouth daily.    Marland Kitchen desvenlafaxine (PRISTIQ) 50 MG 24 hr tablet TAKE 1 TABLET (50 MG TOTAL) BY MOUTH DAILY. 90 tablet 2  . enalapril (VASOTEC) 10 MG tablet TAKE 1 TABLET (10 MG TOTAL) BY MOUTH DAILY. 90 tablet 2  . HYDROcodone-acetaminophen (NORCO/VICODIN) 5-325 MG tablet Take 1 tablet by mouth 2 (two) times daily as needed.    Marland Kitchen HYDROcodone-homatropine (HYCODAN) 5-1.5 MG/5ML syrup Take 5 mLs by mouth every 8 (eight) hours as needed for cough. 120 mL 0  . ketoconazole (NIZORAL) 2 % cream Apply 1 application topically daily. 15 g 0  . levothyroxine (SYNTHROID) 75 MCG tablet TAKE 1 TABLET (75 MCG TOTAL) BY MOUTH DAILY. 90 tablet 1  . loratadine (CLARITIN) 10 MG tablet Take 10 mg by mouth daily.    . nitroGLYCERIN (NITROSTAT) 0.4 MG SL tablet Place 1 tablet (0.4 mg total) under the tongue every 5 (five) minutes as needed for chest pain. 25 tablet 3  . pantoprazole (PROTONIX) 40 MG tablet TAKE 1 TABLET BY MOUTH TWICE A DAY BEFORE A MEAL. 180 tablet 3   No facility-administered medications prior to visit.     Review of Systems:   Constitutional:   No  weight loss, night sweats,  Fevers, chills,  +fatigue, or  lassitude.  HEENT:   No headaches,  Difficulty swallowing,  Tooth/dental problems, or  Sore throat,                No sneezing, itching, ear ache,  +nasal congestion, post nasal drip,   CV:  No chest pain,  Orthopnea, PND, swelling in lower extremities, anasarca, dizziness, palpitations, syncope.   GI  No heartburn, indigestion, abdominal pain, nausea, vomiting, diarrhea, change in bowel habits, loss of appetite, bloody stools.   Resp:  .  No chest wall deformity  Skin: no rash or lesions.  GU: no dysuria, change in color of urine, no urgency or frequency.  No flank pain, no hematuria   MS:  No joint pain or swelling.  No decreased range of motion.  No back pain.    Physical Exam    GEN: A/Ox3;  pleasant , NAD, well nourished    HEENT:  [place]/AT,   NOSE-clear, THROAT-clear, no lesions, no postnasal drip or exudate noted.   NECK:  Supple w/ fair ROM; no JVD; normal carotid impulses w/o bruits; no thyromegaly or nodules palpated; no lymphadenopathy.    RESP  Clear  P & A; w/o, wheezes/ rales/ or rhonchi. no accessory muscle use, no dullness to percussion  CARD:  RRR, no m/r/g, no peripheral edema, pulses intact, no cyanosis or clubbing.  GI:   Soft & nt; nml bowel sounds; no organomegaly or masses detected.   Musco: Warm bil, no deformities or joint swelling noted.  Neuro: alert, no focal deficits noted.    Skin: Warm, no lesions or rashes    Lab Results:  CBC  BNP No results found for: BNP  ProBNP No results found for: PROBNP  Imaging: CT Chest Wo Contrast  Result Date: 02/11/2021 CLINICAL DATA:  Nodule follow-up in a 55 year old female found to have an abnormality on previous chest CT in the LEFT lower lobe. EXAM: CT CHEST WITHOUT CONTRAST TECHNIQUE: Multidetector CT imaging of the chest was performed following the standard protocol without IV contrast. COMPARISON:  12/25/2020 FINDINGS: Cardiovascular: Scattered aortic atherosclerosis. Three-vessel coronary artery calcifications. Heart size normal without pericardial effusion. No significant aortic dilation. Central pulmonary vasculature is of normal caliber. Limited assessment of cardiovascular structures given lack of intravenous contrast. Mediastinum/Nodes: Thoracic inlet structures are normal. Esophagus grossly normal though mildly patulous. No adenopathy in the mediastinum no axillary lymphadenopathy. No gross hilar lymphadenopathy. Lungs/Pleura: Near complete interval resolution of the nodular opacity that was present in the LEFT lung base now with only minimal geographic ground-glass in the area of concern in the LEFT lower lobe airways are patent. No consolidation. No pleural effusion. Upper Abdomen: Hepatic steatosis  with mildly lobular hepatic contours. Liver is incompletely imaged. Imaged portions of spleen, pancreas and adrenal glands are normal. Musculoskeletal: No acute musculoskeletal process. No destructive bone finding. Spinal degenerative changes. IMPRESSION: 1. Near complete resolution of focal nodular opacity in the LEFT chest compatible with resolving infection or inflammation. Only minimal ground-glass is present in this location currently with nonfocal appearance. 2. Hepatic steatosis with mildly lobular hepatic contours. Correlate with any clinical or laboratory evidence of liver disease. 3. Three-vessel coronary artery calcifications. Please note that the presence of coronary artery calcium documents the presence of coronary artery disease, the severity of this disease and any potential stenosis cannot be assessed on this non-gated CT examination. Assessment for potential risk factor modification, dietary therapy or pharmacologic therapy may be warranted. 4. Aortic atherosclerosis. Aortic Atherosclerosis (ICD10-I70.0). Electronically Signed   By: Zetta Bills M.D.   On: 02/11/2021 08:07    cefTRIAXone (ROCEPHIN) injection 1 g    Date Action Dose Route User   12/24/2020 1306 Given 1 g Intramuscular (Left Upper Outer Quadrant) Valencia, Araceli, CMA      No flowsheet data found.  No results found for: NITRICOXIDE      Assessment & Plan:   No problem-specific Assessment & Plan notes found for this encounter.     Rexene Edison, NP 02/19/2021

## 2021-02-19 NOTE — Patient Instructions (Addendum)
Activity as tolerated.  Work on quitting smoking .  Albuterol inhaler As needed   Discuss with PCP that Vasotec may aggravate cough .  Follow up with Dr. Valeta Harms in 4 months with PFT and As needed

## 2021-02-20 DIAGNOSIS — J189 Pneumonia, unspecified organism: Secondary | ICD-10-CM | POA: Insufficient documentation

## 2021-02-20 LAB — HEMOGLOBIN A1C
Hgb A1c MFr Bld: 5.6 % of total Hgb (ref <5.7)
Mean Plasma Glucose: 114 mg/dL
eAG (mmol/L): 6.3 mmol/L

## 2021-02-20 LAB — HEPATIC FUNCTION PANEL
AG Ratio: 2.4 (calc) (ref 1.0–2.5)
ALT: 25 U/L (ref 6–29)
AST: 26 U/L (ref 10–35)
Albumin: 4.3 g/dL (ref 3.6–5.1)
Alkaline phosphatase (APISO): 76 U/L (ref 37–153)
Bilirubin, Direct: 0.1 mg/dL (ref 0.0–0.2)
Globulin: 1.8 g/dL (calc) — ABNORMAL LOW (ref 1.9–3.7)
Indirect Bilirubin: 0.5 mg/dL (calc) (ref 0.2–1.2)
Total Bilirubin: 0.6 mg/dL (ref 0.2–1.2)
Total Protein: 6.1 g/dL (ref 6.1–8.1)

## 2021-02-20 LAB — LIPID PANEL
Cholesterol: 129 mg/dL (ref <200)
HDL: 48 mg/dL — ABNORMAL LOW (ref >=50)
LDL Cholesterol (Calc): 59 mg/dL (calc)
Non-HDL Cholesterol (Calc): 81 mg/dL (calc) (ref <130)
Total CHOL/HDL Ratio: 2.7 (calc) (ref <5.0)
Triglycerides: 134 mg/dL (ref <150)

## 2021-02-20 NOTE — Assessment & Plan Note (Signed)
Smoking cessation discussed 

## 2021-02-20 NOTE — Assessment & Plan Note (Signed)
Recent left lower lobe pneumonia.  With nodular pacing on CT scan.  Patient is much better clinically from antibiotics.  CT repeated and shows near resolution of previous left lower lobe opacity.  No further imaging is indicated at this time.  Patient is encouraged on smoking cessation.

## 2021-02-20 NOTE — Assessment & Plan Note (Signed)
Patient is encouraged on smoking cessation. Check PFTs on return. Plan  Patient Instructions  Activity as tolerated.  Work on quitting smoking .  Albuterol inhaler As needed   Discuss with PCP that Vasotec may aggravate cough .  Follow up with Dr. Valeta Harms in 4 months with PFT and As needed

## 2021-02-23 ENCOUNTER — Encounter: Payer: Self-pay | Admitting: Internal Medicine

## 2021-02-23 ENCOUNTER — Other Ambulatory Visit (HOSPITAL_COMMUNITY): Payer: Self-pay | Admitting: Physical Medicine and Rehabilitation

## 2021-02-23 ENCOUNTER — Ambulatory Visit: Payer: 59 | Admitting: Internal Medicine

## 2021-02-23 ENCOUNTER — Other Ambulatory Visit: Payer: Self-pay

## 2021-02-23 VITALS — BP 102/80 | HR 81 | Ht 65.0 in | Wt 190.0 lb

## 2021-02-23 DIAGNOSIS — M47817 Spondylosis without myelopathy or radiculopathy, lumbosacral region: Secondary | ICD-10-CM | POA: Diagnosis not present

## 2021-02-23 DIAGNOSIS — G894 Chronic pain syndrome: Secondary | ICD-10-CM | POA: Diagnosis not present

## 2021-02-23 DIAGNOSIS — F419 Anxiety disorder, unspecified: Secondary | ICD-10-CM

## 2021-02-23 DIAGNOSIS — I10 Essential (primary) hypertension: Secondary | ICD-10-CM

## 2021-02-23 DIAGNOSIS — E8881 Metabolic syndrome: Secondary | ICD-10-CM

## 2021-02-23 DIAGNOSIS — R002 Palpitations: Secondary | ICD-10-CM | POA: Diagnosis not present

## 2021-02-23 DIAGNOSIS — F439 Reaction to severe stress, unspecified: Secondary | ICD-10-CM | POA: Diagnosis not present

## 2021-02-23 DIAGNOSIS — M7918 Myalgia, other site: Secondary | ICD-10-CM | POA: Diagnosis not present

## 2021-02-23 DIAGNOSIS — F17200 Nicotine dependence, unspecified, uncomplicated: Secondary | ICD-10-CM

## 2021-02-23 DIAGNOSIS — E039 Hypothyroidism, unspecified: Secondary | ICD-10-CM

## 2021-02-23 DIAGNOSIS — I252 Old myocardial infarction: Secondary | ICD-10-CM

## 2021-02-23 DIAGNOSIS — Z8679 Personal history of other diseases of the circulatory system: Secondary | ICD-10-CM | POA: Diagnosis not present

## 2021-02-23 DIAGNOSIS — M2550 Pain in unspecified joint: Secondary | ICD-10-CM | POA: Diagnosis not present

## 2021-02-23 DIAGNOSIS — M542 Cervicalgia: Secondary | ICD-10-CM | POA: Diagnosis not present

## 2021-02-23 DIAGNOSIS — F32A Depression, unspecified: Secondary | ICD-10-CM

## 2021-02-23 DIAGNOSIS — G8929 Other chronic pain: Secondary | ICD-10-CM

## 2021-02-23 DIAGNOSIS — R7302 Impaired glucose tolerance (oral): Secondary | ICD-10-CM

## 2021-02-23 NOTE — Progress Notes (Signed)
   Subjective:    Patient ID: [name], female    DOB: 1966/02/10, 55 y.o.   MRN: [id number]  HPI 55 year old Female for follow up on several issues. Has been having difficulty cutting down on smoking. Work is stressful. Harder to quit smoking while working. Days off are better. Discussed smoking cessation technique of cutting back I cigarette daily weekly and then another cut back of one cigarette the following week and so on for 20 weeks.  Recent CT of chest showed resolution of pneumonia. Nodular opacity Left lower lung seems to be resolving per Pulmonary note and thought to be related to recent infection.No further imaging at this time.  Still having palpitations. Has a cardiac monitor she is wearing. Perhaps PACs and not A-fib by rhythm strip she brings in today.   She is wondering about dx of aortic atherosclerosis. Is on statin. This was mentioned as a diagnosis. Explained that to her- it is common . All the more reason to take statin medication.      Review of Systems no new complaints. Worried about her father who is elderly and lives alone. Does not want to go to counseling for situational stress.     Objective:   Physical Exam  BP 102/80 pulse 81 pulse ox 96% Weight 190 pounds. BMI 31.62  Chest is clear. No carotid bruits, Cor RRR. No edema of LEs.      Assessment & Plan:  Resolving nodule on CT scan-Pulmonary does not feel additional imaging is necessary- at risk for COPD and continues to smoke as well as risk factor with coronary disease.  This has been discussed.  Smoking cessation-counseled regarding smoking cessation and with this concern for lung nodule in the face of smoking it is imperative that she quit.  Also has coronary disease.  Technique discussed for smoking cessation by decreasing 1 cigarette/day each week  Coronary artery disease-Dr. Burt Knack is her cardiologist.  Needs to decrease risk factors for coronary disease.  She is on statin.  Needs  to quit smoking and lose weight.  Palpitations-is wearing monitor at this point in time and we will see what rhythm disturbance she has.  TSH is normal on thyroid replacement.  Avoid caffeine.  BMI 31-needs to exercise and lose weight in the face of coronary disease  Situational stress-discussed.  Does not want to go to counseling.  Has Xanax and is on SSRI.   History of impaired glucose tolerance-hemoglobin A1c March 17 was normal at 5.6% which is excellent.  Previously was 6% in January.  Health maintenance-mammogram and colonoscopy are up-to-date.  Has had 3 COVID vaccines and flu vaccine.  Address tetanus immunization at next visit.  Needs to check with employee health regarding this.  Plan: Her health maintenance exam is due in 6 months.

## 2021-02-23 NOTE — Patient Instructions (Signed)
It was a pleasure to see you today.  Please continue to work on smoking cessation.  We will see what rhythm cardiac monitor shows.  Work on diet exercise and weight loss.  Continue current medications and follow-up in 6 months.

## 2021-03-02 ENCOUNTER — Other Ambulatory Visit: Payer: Self-pay | Admitting: Internal Medicine

## 2021-03-27 ENCOUNTER — Other Ambulatory Visit (HOSPITAL_COMMUNITY): Payer: Self-pay

## 2021-03-27 MED FILL — Hydrocodone-Acetaminophen Tab 5-325 MG: ORAL | 30 days supply | Qty: 60 | Fill #0 | Status: AC

## 2021-04-22 ENCOUNTER — Other Ambulatory Visit (HOSPITAL_COMMUNITY): Payer: Self-pay

## 2021-04-22 DIAGNOSIS — M47817 Spondylosis without myelopathy or radiculopathy, lumbosacral region: Secondary | ICD-10-CM | POA: Diagnosis not present

## 2021-04-22 DIAGNOSIS — M2550 Pain in unspecified joint: Secondary | ICD-10-CM | POA: Diagnosis not present

## 2021-04-22 DIAGNOSIS — G894 Chronic pain syndrome: Secondary | ICD-10-CM | POA: Diagnosis not present

## 2021-04-22 DIAGNOSIS — M542 Cervicalgia: Secondary | ICD-10-CM | POA: Diagnosis not present

## 2021-04-22 MED ORDER — HYDROCODONE-ACETAMINOPHEN 5-325 MG PO TABS
1.0000 | ORAL_TABLET | Freq: Two times a day (BID) | ORAL | 0 refills | Status: DC | PRN
  Filled 2021-06-01: qty 60, 30d supply, fill #0

## 2021-04-22 MED ORDER — HYDROCODONE-ACETAMINOPHEN 5-325 MG PO TABS
1.0000 | ORAL_TABLET | Freq: Two times a day (BID) | ORAL | 0 refills | Status: DC | PRN
  Filled 2021-04-29: qty 60, 30d supply, fill #0

## 2021-04-29 ENCOUNTER — Other Ambulatory Visit (HOSPITAL_COMMUNITY): Payer: Self-pay

## 2021-04-29 MED FILL — Amlodipine Besylate Tab 5 MG (Base Equivalent): ORAL | 90 days supply | Qty: 90 | Fill #0 | Status: AC

## 2021-04-29 MED FILL — Desvenlafaxine Succinate Tab ER 24HR 50 MG (Base Equiv): ORAL | 90 days supply | Qty: 90 | Fill #0 | Status: AC

## 2021-05-05 ENCOUNTER — Other Ambulatory Visit (HOSPITAL_COMMUNITY): Payer: Self-pay

## 2021-05-05 MED FILL — Pantoprazole Sodium EC Tab 40 MG (Base Equiv): ORAL | 90 days supply | Qty: 180 | Fill #0 | Status: AC

## 2021-05-31 ENCOUNTER — Other Ambulatory Visit: Payer: Self-pay | Admitting: Internal Medicine

## 2021-05-31 DIAGNOSIS — I2510 Atherosclerotic heart disease of native coronary artery without angina pectoris: Secondary | ICD-10-CM

## 2021-05-31 DIAGNOSIS — I10 Essential (primary) hypertension: Secondary | ICD-10-CM

## 2021-05-31 MED ORDER — ENALAPRIL MALEATE 10 MG PO TABS
10.0000 mg | ORAL_TABLET | Freq: Every day | ORAL | 2 refills | Status: DC
  Filled 2021-05-31: qty 90, 90d supply, fill #0
  Filled 2021-09-02: qty 90, 90d supply, fill #1
  Filled 2021-12-01: qty 90, 90d supply, fill #2

## 2021-05-31 MED ORDER — ALBUTEROL SULFATE HFA 108 (90 BASE) MCG/ACT IN AERS
2.0000 | INHALATION_SPRAY | Freq: Four times a day (QID) | RESPIRATORY_TRACT | 11 refills | Status: DC | PRN
  Filled 2021-05-31: qty 8.5, 25d supply, fill #0
  Filled 2021-07-14: qty 8.5, 25d supply, fill #1
  Filled 2021-09-02: qty 8.5, 25d supply, fill #2
  Filled 2022-03-17: qty 8.5, 25d supply, fill #3

## 2021-05-31 MED FILL — Levothyroxine Sodium Tab 75 MCG: ORAL | 90 days supply | Qty: 90 | Fill #0 | Status: AC

## 2021-05-31 MED FILL — Atorvastatin Calcium Tab 40 MG (Base Equivalent): ORAL | 90 days supply | Qty: 90 | Fill #0 | Status: AC

## 2021-06-01 ENCOUNTER — Other Ambulatory Visit (HOSPITAL_COMMUNITY): Payer: Self-pay

## 2021-06-02 ENCOUNTER — Other Ambulatory Visit (HOSPITAL_COMMUNITY): Payer: Self-pay

## 2021-06-15 ENCOUNTER — Telehealth: Payer: Self-pay

## 2021-06-15 NOTE — Telephone Encounter (Signed)
Patient made aware.

## 2021-06-15 NOTE — Telephone Encounter (Signed)
Patient called she wants to know who you would recommend as an oral surgeon?

## 2021-06-22 ENCOUNTER — Other Ambulatory Visit (HOSPITAL_COMMUNITY): Payer: Self-pay

## 2021-06-22 DIAGNOSIS — M542 Cervicalgia: Secondary | ICD-10-CM | POA: Diagnosis not present

## 2021-06-22 DIAGNOSIS — G894 Chronic pain syndrome: Secondary | ICD-10-CM | POA: Diagnosis not present

## 2021-06-22 DIAGNOSIS — M2550 Pain in unspecified joint: Secondary | ICD-10-CM | POA: Diagnosis not present

## 2021-06-22 DIAGNOSIS — M47817 Spondylosis without myelopathy or radiculopathy, lumbosacral region: Secondary | ICD-10-CM | POA: Diagnosis not present

## 2021-06-22 MED ORDER — CHLORHEXIDINE GLUCONATE 0.12 % MT SOLN
OROMUCOSAL | 0 refills | Status: DC
  Filled 2021-06-22: qty 473, 14d supply, fill #0

## 2021-06-22 MED ORDER — TIZANIDINE HCL 2 MG PO TABS
2.0000 mg | ORAL_TABLET | Freq: Three times a day (TID) | ORAL | 0 refills | Status: DC | PRN
  Filled 2021-06-22: qty 90, 30d supply, fill #0

## 2021-07-01 ENCOUNTER — Other Ambulatory Visit (HOSPITAL_COMMUNITY): Payer: Self-pay

## 2021-07-02 ENCOUNTER — Other Ambulatory Visit (HOSPITAL_COMMUNITY): Payer: Self-pay

## 2021-07-03 ENCOUNTER — Other Ambulatory Visit (HOSPITAL_COMMUNITY): Payer: Self-pay

## 2021-07-03 MED ORDER — HYDROCODONE-ACETAMINOPHEN 5-325 MG PO TABS
1.0000 | ORAL_TABLET | Freq: Two times a day (BID) | ORAL | 0 refills | Status: DC | PRN
  Filled 2021-07-31 – 2021-08-03 (×2): qty 60, 30d supply, fill #0

## 2021-07-03 MED ORDER — HYDROCODONE-ACETAMINOPHEN 5-325 MG PO TABS
1.0000 | ORAL_TABLET | Freq: Two times a day (BID) | ORAL | 0 refills | Status: DC | PRN
  Filled 2021-07-03: qty 60, 30d supply, fill #0

## 2021-07-14 ENCOUNTER — Other Ambulatory Visit (HOSPITAL_COMMUNITY): Payer: Self-pay

## 2021-07-15 ENCOUNTER — Other Ambulatory Visit: Payer: Self-pay | Admitting: Internal Medicine

## 2021-07-15 DIAGNOSIS — Z1231 Encounter for screening mammogram for malignant neoplasm of breast: Secondary | ICD-10-CM

## 2021-07-20 ENCOUNTER — Ambulatory Visit
Admission: RE | Admit: 2021-07-20 | Discharge: 2021-07-20 | Disposition: A | Discharge status: Home / Self Care | Payer: 59 | Source: Ambulatory Visit

## 2021-07-20 ENCOUNTER — Other Ambulatory Visit: Payer: Self-pay

## 2021-07-20 DIAGNOSIS — Z1231 Encounter for screening mammogram for malignant neoplasm of breast: Secondary | ICD-10-CM | POA: Diagnosis not present

## 2021-07-27 ENCOUNTER — Other Ambulatory Visit: Payer: Self-pay | Admitting: Internal Medicine

## 2021-07-27 MED FILL — Desvenlafaxine Succinate Tab ER 24HR 50 MG (Base Equiv): ORAL | 90 days supply | Qty: 90 | Fill #1 | Status: AC

## 2021-07-27 MED FILL — Amlodipine Besylate Tab 5 MG (Base Equivalent): ORAL | 90 days supply | Qty: 90 | Fill #1 | Status: AC

## 2021-07-28 ENCOUNTER — Other Ambulatory Visit (HOSPITAL_COMMUNITY): Payer: Self-pay

## 2021-07-28 MED ORDER — PANTOPRAZOLE SODIUM 40 MG PO TBEC
40.0000 mg | DELAYED_RELEASE_TABLET | Freq: Two times a day (BID) | ORAL | 3 refills | Status: DC
  Filled 2021-07-28: qty 180, fill #0
  Filled 2021-07-31: qty 180, 90d supply, fill #0
  Filled 2021-10-26: qty 180, 90d supply, fill #1
  Filled 2022-01-25: qty 180, 90d supply, fill #2
  Filled 2022-04-26: qty 180, 90d supply, fill #3

## 2021-07-29 ENCOUNTER — Other Ambulatory Visit (HOSPITAL_COMMUNITY): Payer: Self-pay

## 2021-07-31 ENCOUNTER — Other Ambulatory Visit (HOSPITAL_COMMUNITY): Payer: Self-pay

## 2021-08-03 ENCOUNTER — Other Ambulatory Visit (HOSPITAL_COMMUNITY): Payer: Self-pay

## 2021-08-04 ENCOUNTER — Other Ambulatory Visit (HOSPITAL_COMMUNITY): Payer: Self-pay

## 2021-08-17 ENCOUNTER — Other Ambulatory Visit (HOSPITAL_COMMUNITY): Payer: Self-pay

## 2021-08-17 DIAGNOSIS — M2550 Pain in unspecified joint: Secondary | ICD-10-CM | POA: Diagnosis not present

## 2021-08-17 DIAGNOSIS — M47817 Spondylosis without myelopathy or radiculopathy, lumbosacral region: Secondary | ICD-10-CM | POA: Diagnosis not present

## 2021-08-17 DIAGNOSIS — M542 Cervicalgia: Secondary | ICD-10-CM | POA: Diagnosis not present

## 2021-08-17 DIAGNOSIS — G894 Chronic pain syndrome: Secondary | ICD-10-CM | POA: Diagnosis not present

## 2021-08-17 DIAGNOSIS — Z79891 Long term (current) use of opiate analgesic: Secondary | ICD-10-CM | POA: Diagnosis not present

## 2021-08-17 MED ORDER — HYDROCODONE-ACETAMINOPHEN 5-325 MG PO TABS
1.0000 | ORAL_TABLET | Freq: Two times a day (BID) | ORAL | 0 refills | Status: DC
  Filled 2021-10-02: qty 60, 30d supply, fill #0

## 2021-08-17 MED ORDER — HYDROCODONE-ACETAMINOPHEN 5-325 MG PO TABS
1.0000 | ORAL_TABLET | Freq: Two times a day (BID) | ORAL | 0 refills | Status: DC
  Filled 2021-09-03: qty 60, 30d supply, fill #0

## 2021-08-17 MED ORDER — TIZANIDINE HCL 2 MG PO TABS
2.0000 mg | ORAL_TABLET | Freq: Three times a day (TID) | ORAL | 1 refills | Status: DC | PRN
  Filled 2021-08-17: qty 90, 30d supply, fill #0

## 2021-08-20 ENCOUNTER — Other Ambulatory Visit (HOSPITAL_COMMUNITY): Payer: Self-pay

## 2021-08-25 ENCOUNTER — Other Ambulatory Visit: Payer: Self-pay

## 2021-08-25 ENCOUNTER — Other Ambulatory Visit: Payer: 59 | Admitting: Internal Medicine

## 2021-08-25 DIAGNOSIS — Z0000 Encounter for general adult medical examination without abnormal findings: Secondary | ICD-10-CM | POA: Diagnosis not present

## 2021-08-25 LAB — CBC WITH DIFFERENTIAL/PLATELET
Absolute Monocytes: 442 cells/uL (ref 200–950)
Basophils Absolute: 51 cells/uL (ref 0–200)
Basophils Relative: 0.6 %
Eosinophils Absolute: 221 cells/uL (ref 15–500)
Eosinophils Relative: 2.6 %
HCT: 43.9 % (ref 35.0–45.0)
Hemoglobin: 14.8 g/dL (ref 11.7–15.5)
Lymphs Abs: 2423 cells/uL (ref 850–3900)
MCH: 32 pg (ref 27.0–33.0)
MCHC: 33.7 g/dL (ref 32.0–36.0)
MCV: 95 fL (ref 80.0–100.0)
MPV: 12.1 fL (ref 7.5–12.5)
Monocytes Relative: 5.2 %
Neutro Abs: 5364 cells/uL (ref 1500–7800)
Neutrophils Relative %: 63.1 %
Platelets: 184 10*3/uL (ref 140–400)
RBC: 4.62 10*6/uL (ref 3.80–5.10)
RDW: 11.7 % (ref 11.0–15.0)
Total Lymphocyte: 28.5 %
WBC: 8.5 10*3/uL (ref 3.8–10.8)

## 2021-08-25 LAB — COMPLETE METABOLIC PANEL WITH GFR
AG Ratio: 2.3 (calc) (ref 1.0–2.5)
ALT: 21 U/L (ref 6–29)
AST: 22 U/L (ref 10–35)
Albumin: 4.1 g/dL (ref 3.6–5.1)
Alkaline phosphatase (APISO): 87 U/L (ref 37–153)
BUN: 12 mg/dL (ref 7–25)
CO2: 29 mmol/L (ref 20–32)
Calcium: 9.1 mg/dL (ref 8.6–10.4)
Chloride: 102 mmol/L (ref 98–110)
Creat: 0.83 mg/dL (ref 0.50–1.03)
Globulin: 1.8 g/dL (calc) — ABNORMAL LOW (ref 1.9–3.7)
Glucose, Bld: 96 mg/dL (ref 65–99)
Potassium: 4.3 mmol/L (ref 3.5–5.3)
Sodium: 138 mmol/L (ref 135–146)
Total Bilirubin: 0.4 mg/dL (ref 0.2–1.2)
Total Protein: 5.9 g/dL — ABNORMAL LOW (ref 6.1–8.1)
eGFR: 83 mL/min/{1.73_m2} (ref >=60)

## 2021-08-25 LAB — LIPID PANEL
Cholesterol: 139 mg/dL (ref <200)
HDL: 51 mg/dL (ref >=50)
LDL Cholesterol (Calc): 68 mg/dL (calc)
Non-HDL Cholesterol (Calc): 88 mg/dL (calc) (ref <130)
Total CHOL/HDL Ratio: 2.7 (calc) (ref <5.0)
Triglycerides: 110 mg/dL (ref <150)

## 2021-08-25 LAB — TSH: TSH: 1.54 mIU/L (ref 0.40–4.50)

## 2021-08-27 ENCOUNTER — Other Ambulatory Visit: Payer: Self-pay

## 2021-08-27 ENCOUNTER — Ambulatory Visit (INDEPENDENT_AMBULATORY_CARE_PROVIDER_SITE_OTHER): Payer: 59 | Admitting: Internal Medicine

## 2021-08-27 ENCOUNTER — Encounter: Payer: Self-pay | Admitting: Internal Medicine

## 2021-08-27 VITALS — BP 118/80 | HR 71 | Resp 18 | Ht 64.0 in | Wt 179.0 lb

## 2021-08-27 DIAGNOSIS — R7302 Impaired glucose tolerance (oral): Secondary | ICD-10-CM | POA: Diagnosis not present

## 2021-08-27 DIAGNOSIS — F17200 Nicotine dependence, unspecified, uncomplicated: Secondary | ICD-10-CM

## 2021-08-27 DIAGNOSIS — M7918 Myalgia, other site: Secondary | ICD-10-CM

## 2021-08-27 DIAGNOSIS — F32A Depression, unspecified: Secondary | ICD-10-CM

## 2021-08-27 DIAGNOSIS — F439 Reaction to severe stress, unspecified: Secondary | ICD-10-CM | POA: Diagnosis not present

## 2021-08-27 DIAGNOSIS — I252 Old myocardial infarction: Secondary | ICD-10-CM

## 2021-08-27 DIAGNOSIS — G4733 Obstructive sleep apnea (adult) (pediatric): Secondary | ICD-10-CM | POA: Diagnosis not present

## 2021-08-27 DIAGNOSIS — E039 Hypothyroidism, unspecified: Secondary | ICD-10-CM | POA: Diagnosis not present

## 2021-08-27 DIAGNOSIS — F419 Anxiety disorder, unspecified: Secondary | ICD-10-CM | POA: Diagnosis not present

## 2021-08-27 DIAGNOSIS — M25552 Pain in left hip: Secondary | ICD-10-CM

## 2021-08-27 DIAGNOSIS — Z8679 Personal history of other diseases of the circulatory system: Secondary | ICD-10-CM

## 2021-08-27 DIAGNOSIS — Z0000 Encounter for general adult medical examination without abnormal findings: Secondary | ICD-10-CM

## 2021-08-27 DIAGNOSIS — I10 Essential (primary) hypertension: Secondary | ICD-10-CM

## 2021-08-27 DIAGNOSIS — Z6830 Body mass index (BMI) 30.0-30.9, adult: Secondary | ICD-10-CM

## 2021-08-27 DIAGNOSIS — E8881 Metabolic syndrome: Secondary | ICD-10-CM | POA: Diagnosis not present

## 2021-08-27 DIAGNOSIS — G8929 Other chronic pain: Secondary | ICD-10-CM

## 2021-08-27 LAB — POCT URINALYSIS DIPSTICK
Bilirubin, UA: NEGATIVE
Blood, UA: NEGATIVE
Glucose, UA: NEGATIVE
Ketones, UA: NEGATIVE
Leukocytes, UA: NEGATIVE
Nitrite, UA: NEGATIVE
Protein, UA: NEGATIVE
Spec Grav, UA: 1.01 (ref 1.010–1.025)
Urobilinogen, UA: 0.2 E.U./dL
pH, UA: 7 (ref 5.0–8.0)

## 2021-08-27 NOTE — Progress Notes (Signed)
Subjective:    Patient ID: [name], female    DOB: February 05, 1966, 55 y.o.   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  Is on her feet a lot. Hx of CAD, GERD, continue to smoke some, HTN, Hypothyroidism ashthma/COPD impaired glucose tolerance sleep apnea, anxiety and depression.  History of chronic pain for which she is seen in [place] pain [facility].  History of open angle glaucoma seen by Dr. Katy Fitch.  History of carpal tunnel syndrome.  Continues to smoke despite history of MI.  Saw Dr. Vaughan Browner for COPD and sleep apnea in 2020.  Smokes about half pack of cigarettes daily.  Chest x-ray was normal.  Had mild sleep apnea with oxygen desat to 84%.  He offered Chantix but she declined.  He did not feel she met criteria for low-dose screening CT given her age and pack-year smoking history.  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  She had subtotal occlusion of a very small branch of the diagonal and no other significant disease.  She had cardiac cath and did well.  She had a very serious case  of purpura in October 2006.  She developed purpuric lesions on her lower extremities and maroon-colored stools as well as anemia.  CT showed diffuse colitis.  She was transferred to [facility] after developing tea-colored urine with hematuria.  She was treated with high-dose steroids which she remained on for several months.  She has never had a recurrence.  In September 2009 was hospitalized for gastroenteritis secondary to C. difficile.  History of migraine headaches.  History of cervical radiculopathy in 2012 and has been evaluated by Dr. Vertell Limber in the remote past.  History of attention deficit disorder but this is not being treated at present time.  History of endometrial polyp causing menorrhagia removed by Dr. Theda Sers in 2003.  She had [place].  Appendectomy in the early 1970s.  Chronic fibromyalgia type pain for which she requires chronic pain management.  Social history: She is married.  3 adult sons.  Does not consume alcohol.  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  Has had palpitations evaluated by cardiology.  She is currently not on a beta-blocker.  Had Holter monitor in 2018 showing PACs and PVCs but no sustained arrhythmia.  Family history: Father passed away from acute respiratory arrest.  Both parents with history of hypertension.  Father with history of stroke resides alone.  She checks on him often.  2 brothers in good health.  No sisters.    Review of Systems new c/o is left hip pain. Aggravating rising from sitting to standing. Is in hip joint not buttock. Referral to Dr. Ninfa Linden.     Objective:   Physical Exam Blood pressure 118/80 pulse 71 respiratory rate 18 pulse oximetry 98% weight 179 pounds BMI 30.73  Skin: Warm and dry.  Nodes none.  TMs clear.  Neck supple.  No thyromegaly or carotid bruits.  Chest clear to auscultation.  Cardiac exam: Regular rate and rhythm without ectopy.  Abdomen is soft nondistended without hepatosplenomegaly masses or tenderness.  GYN exam deferred to GYN physician.  No lower extremity pitting edema.  Neuro is intact without focal deficits.  Affect thought and judgment are normal.  Labs are reviewed and are within normal limits.     Assessment &  Plan:   History of smoking-needs to quit.  Has seen pulmonologist.  Flu vaccine through employment.  Anxiety depression and situational stress at work.  Heart disease-continue follow-up with cardiologist.  Hyperplastic colon polyps on colonoscopy 2016  History of palpitations that are nonsustained based on Holter monitor.  Essential hypertension-stable on current regimen  Hypothyroidism-stable on current regimen  GE reflux treated with PPI  Impaired glucose tolerance treated with diet.  Hip  pain-referral to Dr. Ninfa Linden  Plan: Continue to encourage diet, exercise and weight loss.  Advised to quit smoking.  Continue current medications and follow-up in 1 year or as needed.  Labs are stable.  Had mammogram in August.  Had colonoscopy in 2016 with follow-up in 10 years.

## 2021-09-02 ENCOUNTER — Other Ambulatory Visit: Payer: Self-pay | Admitting: Physician Assistant

## 2021-09-02 DIAGNOSIS — I10 Essential (primary) hypertension: Secondary | ICD-10-CM

## 2021-09-02 DIAGNOSIS — I2510 Atherosclerotic heart disease of native coronary artery without angina pectoris: Secondary | ICD-10-CM

## 2021-09-02 NOTE — Patient Instructions (Signed)
Referral to Dr. Ninfa Linden regarding hip pain.  Continue to work on diet exercise and weight loss.  Please stop smoking.  Continue current medications.  Return in 1 year or as needed.

## 2021-09-03 ENCOUNTER — Other Ambulatory Visit (HOSPITAL_COMMUNITY): Payer: Self-pay

## 2021-09-03 MED ORDER — ATORVASTATIN CALCIUM 40 MG PO TABS
40.0000 mg | ORAL_TABLET | ORAL | 0 refills | Status: DC
  Filled 2021-09-03: qty 30, 30d supply, fill #0

## 2021-09-07 ENCOUNTER — Other Ambulatory Visit (HOSPITAL_COMMUNITY): Payer: Self-pay

## 2021-09-07 ENCOUNTER — Ambulatory Visit: Payer: 59 | Admitting: Internal Medicine

## 2021-09-07 ENCOUNTER — Encounter: Payer: Self-pay | Admitting: Internal Medicine

## 2021-09-07 ENCOUNTER — Other Ambulatory Visit: Payer: Self-pay

## 2021-09-07 VITALS — BP 128/90 | HR 90 | Temp 98.0°F | Ht 64.0 in | Wt 185.0 lb

## 2021-09-07 DIAGNOSIS — N390 Urinary tract infection, site not specified: Secondary | ICD-10-CM | POA: Diagnosis not present

## 2021-09-07 DIAGNOSIS — R319 Hematuria, unspecified: Secondary | ICD-10-CM

## 2021-09-07 DIAGNOSIS — R300 Dysuria: Secondary | ICD-10-CM | POA: Diagnosis not present

## 2021-09-07 LAB — POCT URINALYSIS DIPSTICK
Bilirubin, UA: NEGATIVE
Glucose, UA: NEGATIVE
Ketones, UA: NEGATIVE
Nitrite, UA: POSITIVE
Protein, UA: POSITIVE — AB
Spec Grav, UA: 1.025 (ref 1.010–1.025)
Urobilinogen, UA: NEGATIVE E.U./dL — AB
pH, UA: 7 (ref 5.0–8.0)

## 2021-09-07 MED ORDER — DOXYCYCLINE HYCLATE 100 MG PO TABS
100.0000 mg | ORAL_TABLET | Freq: Two times a day (BID) | ORAL | 0 refills | Status: DC
  Filled 2021-09-07: qty 20, 10d supply, fill #0

## 2021-09-07 MED ORDER — FLUCONAZOLE 150 MG PO TABS
150.0000 mg | ORAL_TABLET | Freq: Once | ORAL | 0 refills | Status: AC
  Filled 2021-09-07: qty 1, 1d supply, fill #0

## 2021-09-07 NOTE — Progress Notes (Signed)
   Subjective:    Patient ID: [name], female    DOB: 08/30/1966, 55 y.o.   MRN: [id number]  HPI 55 year old Female seen today acutely for hematuria.  She just had health maintenance exam on September 22 and had normal urine dipstick at that time.  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  She had some urinary urgency at 1 point but no dysuria.  Today noted frank hematuria.  She is menopausal.  Now since there is a little bit of dysuria  Remote history of Henoch-Schnlein purpura  Review of Systems no nausea, vomiting, fever, shaking chills     Objective:   Physical Exam Blood pressure 128/90 pulse is 90 temperature 98 degrees weight 185 pounds  No CVA tenderness  Dipstick UA is positive for protein and has large leukocytes and large occult blood.  Culture was sent.     Assessment & Plan:  Probable acute cystitis  Plan: Doxycycline 100 mg twice daily for 10 days pending culture results.  Diflucan 150 mg tablet if needed for Candida vaginitis.

## 2021-09-08 ENCOUNTER — Other Ambulatory Visit: Payer: Self-pay | Admitting: Internal Medicine

## 2021-09-08 ENCOUNTER — Other Ambulatory Visit (HOSPITAL_COMMUNITY): Payer: Self-pay

## 2021-09-08 MED ORDER — LEVOTHYROXINE SODIUM 75 MCG PO TABS
75.0000 ug | ORAL_TABLET | Freq: Every day | ORAL | 1 refills | Status: DC
  Filled 2021-09-08: qty 90, 90d supply, fill #0
  Filled 2021-12-01: qty 90, 90d supply, fill #1

## 2021-09-09 ENCOUNTER — Other Ambulatory Visit (HOSPITAL_COMMUNITY): Payer: Self-pay

## 2021-09-09 LAB — URINE CULTURE
MICRO NUMBER:: 12452143
SPECIMEN QUALITY:: ADEQUATE

## 2021-09-14 DIAGNOSIS — L821 Other seborrheic keratosis: Secondary | ICD-10-CM | POA: Diagnosis not present

## 2021-09-14 DIAGNOSIS — D225 Melanocytic nevi of trunk: Secondary | ICD-10-CM | POA: Diagnosis not present

## 2021-09-14 DIAGNOSIS — L814 Other melanin hyperpigmentation: Secondary | ICD-10-CM | POA: Diagnosis not present

## 2021-09-14 DIAGNOSIS — Z85828 Personal history of other malignant neoplasm of skin: Secondary | ICD-10-CM | POA: Diagnosis not present

## 2021-09-14 DIAGNOSIS — L570 Actinic keratosis: Secondary | ICD-10-CM | POA: Diagnosis not present

## 2021-09-14 DIAGNOSIS — L304 Erythema intertrigo: Secondary | ICD-10-CM | POA: Diagnosis not present

## 2021-09-14 DIAGNOSIS — L738 Other specified follicular disorders: Secondary | ICD-10-CM | POA: Diagnosis not present

## 2021-09-21 ENCOUNTER — Ambulatory Visit (INDEPENDENT_AMBULATORY_CARE_PROVIDER_SITE_OTHER): Payer: 59 | Admitting: Internal Medicine

## 2021-09-21 ENCOUNTER — Other Ambulatory Visit: Payer: Self-pay

## 2021-09-21 ENCOUNTER — Ambulatory Visit (INDEPENDENT_AMBULATORY_CARE_PROVIDER_SITE_OTHER): Payer: 59

## 2021-09-21 ENCOUNTER — Ambulatory Visit: Payer: 59 | Admitting: Orthopaedic Surgery

## 2021-09-21 ENCOUNTER — Encounter: Payer: Self-pay | Admitting: Internal Medicine

## 2021-09-21 VITALS — BP 108/68 | HR 66 | Temp 97.5°F | Ht 65.0 in | Wt 186.0 lb

## 2021-09-21 VITALS — Ht 65.0 in | Wt 187.0 lb

## 2021-09-21 DIAGNOSIS — M79605 Pain in left leg: Secondary | ICD-10-CM | POA: Diagnosis not present

## 2021-09-21 DIAGNOSIS — N390 Urinary tract infection, site not specified: Secondary | ICD-10-CM | POA: Diagnosis not present

## 2021-09-21 DIAGNOSIS — M7062 Trochanteric bursitis, left hip: Secondary | ICD-10-CM | POA: Diagnosis not present

## 2021-09-21 LAB — POCT URINALYSIS DIPSTICK
Bilirubin, UA: NEGATIVE
Glucose, UA: NEGATIVE
Ketones, UA: NEGATIVE
Leukocytes, UA: NEGATIVE
Nitrite, UA: NEGATIVE
Protein, UA: NEGATIVE
Spec Grav, UA: 1.01 (ref 1.010–1.025)
Urobilinogen, UA: NEGATIVE E.U./dL — AB
pH, UA: 7 (ref 5.0–8.0)

## 2021-09-21 NOTE — Progress Notes (Signed)
Office Visit Note   Patient: [name]           Date of Birth: [birth year]           MRN: [id number] Visit Date: 09/21/2021              Requested by: Elby Showers, MD [address] [place],  [place] [id number] PCP: Elby Showers, MD   Assessment & Plan: Visit Diagnoses:  1. Pain in left leg     Plan: Her signs and symptoms are more consistent with left hip trochanteric bursitis and IT band syndrome.  I recommended stretching exercises and showed her how to do these and she demonstrated them back to me.  I have recommended Voltaren gel as well.  My next step would be considering a steroid injection and even outpatient physical therapy.  All questions and concerns were answered and addressed.  If things worsen she will let us know.  Follow-Up Instructions: Return if symptoms worsen or fail to improve.   Orders:  Orders Placed This Encounter  Procedures   XR HIP UNILAT W OR W/O PELVIS 1V LEFT   XR Lumbar Spine 2-3 Views   No orders of the defined types were placed in this encounter.     Procedures: No procedures performed   Clinical Data: No additional findings.   Subjective: Chief Complaint  Patient presents with   Left Hip - Pain  Patient is a very pleasant 55 year old female sent from Dr. Kyra Manges Baxley to evaluate and treat chronic left hip pain has been going on for several months now.  She denies groin pain.  She points to lateral aspect of her left hip and just posterior to the troches as a source of her pain.  There is no radicular components.  She has not had any injury that she is aware of.  She does a lot of work where she is up and down quite a bit.  She is not a diabetic either.  She denies any specific injuries.  There is been no change in bowel or bladder function.  There is been no weakness in her lower extremities.  HPI  Review of Systems There is currently listed no headache, chest pain, shortness of breath, fever, chills,  nausea, vomiting  Objective: Vital Signs: Ht 5\' 5"  (1.651 m)   Wt 187 lb (84.8 kg)   BMI 31.12 kg/m   Physical Exam She is alert and orient x3 and in no acute distress Ortho Exam Examination of her left hip shows it moves smoothly and fluidly with no blocks to rotation.  Her right hip exam is also normal.  When I have her lay on her side with her left hip up there is pain to palpation of the trochanteric area and the proximal IT band only.  Her back exam is normal. Specialty Comments:  No specialty comments available.  Imaging: XR Lumbar Spine 2-3 Views  Result Date: 09/21/2021 2 views of the lumbar spine showed no acute findings.  There is slight degenerative changes between L5 and S1 and the posterior elements.    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   Impaired glucose tolerance 03/30/2015   Obstructive sleep apnea 06/10/2013   Acute MI anterior wall first episode care [facility]) 11/24/2012   CAD (coronary artery disease) 11/24/2012   Coronary vasospasm ([place]) 11/24/2012   Tobacco abuse  11/24/2012   History of smoking 08/07/2012   Musculoskeletal pain 10/10/2011   Anxiety and depression 10/10/2011   Attention deficit disorder 10/10/2011   HSP (Henoch Schonlein purpura) ([place]) 10/10/2011   Asthma 10/10/2011   GE reflux 10/10/2011   Hypertension 10/10/2011   Hypothyroidism 10/10/2011   History of migraine headaches 10/10/2011   Past Medical History:  Diagnosis Date   ADHD (attention deficit hyperactivity disorder)    Anxiety    Asthma    CAD (coronary artery disease)    a. 11/2012 Acute Anterior STEMI/Cath: Nonobs dzs except subtotal occlusion of small, 34mm D2, EF 55-65%;  b. 11/2012  Echo: EF 60-65%, nl wall motion, Gr 2 DD.   Chronic kidney disease    in setting of HSP 2006   Coronary vasospasm ([place])    a. 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   Tobacco abuse     Family History  Problem Relation Age of Onset   Stroke Father    Hypertension Mother    Heart disease Neg Hx     Past Surgical History:  Procedure Laterality Date   APPENDECTOMY  1970's   CARDIAC CATHETERIZATION  11/22/2012   "first one" (11/22/2012)   [place]; 1996; [place]  1990's   LEFT HEART CATHETERIZATION WITH CORONARY ANGIOGRAM N/A 11/22/2012   Procedure: LEFT HEART CATHETERIZATION WITH CORONARY ANGIOGRAM;  Surgeon: Sherren Mocha, MD;  Location: [facility] CATH LAB;  Service: Cardiovascular;  Laterality: N/A;   Social History   Occupational History   Occupation: RN/UC    Employer: [employer]    Comment: [employer]  Tobacco Use   Smoking status: Every Day    Packs/day: 0.50    Years: 25.00    Pack years: 12.50    Types: Cigarettes   Smokeless tobacco: Never   Tobacco comments:    smokes 1/2 pack per day   Vaping Use   Vaping Use: Never used  Substance and Sexual Activity   Alcohol use: No   Drug use: No   Sexual activity: Yes

## 2021-09-21 NOTE — Patient Instructions (Addendum)
UTI symptoms  resolved after treatment.  Regarding left hip trochanteric bursitis can take anti-inflammatory medication or Vicodin provided by chronic pain physician.  The area can be injected by orthopedist.

## 2021-09-21 NOTE — Progress Notes (Signed)
   Subjective:    Patient ID: [name], female    DOB: 20-Jun-1966, 55 y.o.   MRN: [id number]  HPI  55 year old Female seen for follow-up on acute UTI initially seen on October 3.  At that time had hematuria.  She had just had health maintenance exam on September 22 and had normal urine dipstick at that time.  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  On October 3 had frank hematuria.  Urine dipstick was positive for protein and had large leukocytes and large occult blood.  Culture grew E. coli.  Patient was treated with doxycycline.  She is now here for follow-up and is asymptomatic.  Also having some trochanteric bursitis of left hip  Review of Systems feels well with no new issues with urgency, frequency, hematuria ,or dysuria     Objective:   Physical Exam Vital signs reviewed.  She is afebrile.  No CVA tenderness.  Urine dipstick is essentially clear with specific gravity 1.010.  There is trace occult blood.  No protein.  No leukocytes.  She is asymptomatic.       Assessment & Plan:  E. coli UTI-resolved  Left hip trochanteric bursitis  Plan: May take over-the-counter anti-inflammatory medication.  Could inject the area if persistently sore and aggravating.  Has Vicodin provided by chronic pain physician Dr. Greta Doom.

## 2021-09-24 ENCOUNTER — Other Ambulatory Visit (HOSPITAL_COMMUNITY): Payer: Self-pay

## 2021-10-01 ENCOUNTER — Telehealth: Payer: Self-pay | Admitting: Internal Medicine

## 2021-10-01 ENCOUNTER — Telehealth (INDEPENDENT_AMBULATORY_CARE_PROVIDER_SITE_OTHER): Payer: 59 | Admitting: Internal Medicine

## 2021-10-01 ENCOUNTER — Other Ambulatory Visit (HOSPITAL_COMMUNITY): Payer: Self-pay

## 2021-10-01 DIAGNOSIS — U071 COVID-19: Secondary | ICD-10-CM | POA: Diagnosis not present

## 2021-10-01 DIAGNOSIS — I10 Essential (primary) hypertension: Secondary | ICD-10-CM

## 2021-10-01 DIAGNOSIS — F419 Anxiety disorder, unspecified: Secondary | ICD-10-CM | POA: Diagnosis not present

## 2021-10-01 DIAGNOSIS — R7302 Impaired glucose tolerance (oral): Secondary | ICD-10-CM | POA: Diagnosis not present

## 2021-10-01 DIAGNOSIS — F17200 Nicotine dependence, unspecified, uncomplicated: Secondary | ICD-10-CM | POA: Diagnosis not present

## 2021-10-01 DIAGNOSIS — F32A Depression, unspecified: Secondary | ICD-10-CM | POA: Diagnosis not present

## 2021-10-01 DIAGNOSIS — G4733 Obstructive sleep apnea (adult) (pediatric): Secondary | ICD-10-CM | POA: Diagnosis not present

## 2021-10-01 DIAGNOSIS — Z8679 Personal history of other diseases of the circulatory system: Secondary | ICD-10-CM

## 2021-10-01 DIAGNOSIS — E039 Hypothyroidism, unspecified: Secondary | ICD-10-CM

## 2021-10-01 MED ORDER — DOXYCYCLINE HYCLATE 100 MG PO TABS
100.0000 mg | ORAL_TABLET | Freq: Two times a day (BID) | ORAL | 0 refills | Status: DC
  Filled 2021-10-01: qty 20, 10d supply, fill #0

## 2021-10-01 MED ORDER — HYDROCODONE BIT-HOMATROP MBR 5-1.5 MG/5ML PO SOLN
5.0000 mL | Freq: Three times a day (TID) | ORAL | 0 refills | Status: DC | PRN
  Filled 2021-10-01: qty 120, 8d supply, fill #0

## 2021-10-01 NOTE — Telephone Encounter (Signed)
Patient has called back and accepted the appt.

## 2021-10-01 NOTE — Telephone Encounter (Signed)
[name] [phone]  [name] called to say she tested positive yesterday for COVID, she had the flu shot on Monday, but on Tuesday she felt achy and had some chills and sweating and started with a cough yesterday. She would like something for a cough. No fever Virtual Visit this afternoon?

## 2021-10-01 NOTE — Telephone Encounter (Signed)
LVM that I have schedule her for video visit at 4:30 to call back to confirm

## 2021-10-01 NOTE — Progress Notes (Signed)
   Subjective:    Patient ID: [name], female    DOB: 04-07-1966, 55 y.o.   MRN: [id number]  HPI 55 year old Female seen today with regard to positive home COVID test for COVID-19.  She had flu vaccine on Monday, October 24.  On Tuesday, October 25 felt achy and had some chills with diaphoresis.  Yesterday started with a cough.  Tested herself and found to have COVID-19.  Believes she could have been exposed at work.  No one else has COVID that she is aware of.  Does not feel all that badly but feels a bit like the flu.  Has malaise fatigue cough and myalgias.  Due to the Coronavirus pandemic, she is seen via interactive audio and video telecommunications today.  She is at her home and I am at my office.  She is identified using 2 identifiers as [name], a patient in this practice.  She is agreeable to visit in this format today.  She has a history of coronary artery disease and is followed by [facility] cardiology.  She has a history of smoking.  She has a history of hypothyroidism and hyperlipidemia.  History of GE reflux.  History of reactive airways disease.  History of anxiety.  History of obstructive sleep apnea.  Review of Systems see above     Objective:   Physical Exam  Vital signs available.  She looks pale.  She is not tachypneic.  She does not sound hoarse.  Occasional cough.        Assessment & Plan:  Acute COVID-19 virus infection.  We discussed treatment options including antibiotics and cough syrup versus Paxlovid.  She does not feel all that ill but feels a bit like she has the flu.  She is elected to be treated with antibiotics.  Have sent in doxycycline 100 mg twice daily for 10 days.  Take Hycodan 1 teaspoon every 8 hours as needed for cough.  Monitor pulse oximetry.  Walk some to prevent atelectasis.  Stay well-hydrated.  Call if symptoms worsen.  Speak with Health at Work regarding

## 2021-10-02 ENCOUNTER — Other Ambulatory Visit (HOSPITAL_COMMUNITY): Payer: Self-pay

## 2021-10-02 MED ORDER — CARESTART COVID-19 HOME TEST VI KIT
PACK | 0 refills | Status: DC
  Filled 2021-10-02: qty 4, 4d supply, fill #0

## 2021-10-05 ENCOUNTER — Other Ambulatory Visit: Payer: Self-pay

## 2021-10-05 ENCOUNTER — Encounter: Payer: Self-pay | Admitting: Cardiovascular Disease

## 2021-10-05 ENCOUNTER — Encounter: Payer: Self-pay | Admitting: Internal Medicine

## 2021-10-05 ENCOUNTER — Ambulatory Visit: Payer: 59 | Admitting: Cardiovascular Disease

## 2021-10-05 VITALS — BP 138/92 | HR 90 | Ht 65.0 in | Wt 186.2 lb

## 2021-10-05 DIAGNOSIS — E782 Mixed hyperlipidemia: Secondary | ICD-10-CM | POA: Diagnosis not present

## 2021-10-05 DIAGNOSIS — I10 Essential (primary) hypertension: Secondary | ICD-10-CM

## 2021-10-05 DIAGNOSIS — Z720 Tobacco use: Secondary | ICD-10-CM | POA: Diagnosis not present

## 2021-10-05 DIAGNOSIS — I2510 Atherosclerotic heart disease of native coronary artery without angina pectoris: Secondary | ICD-10-CM

## 2021-10-05 NOTE — Patient Instructions (Addendum)
Medication Instructions:  *If you need a refill on your cardiac medications before your next appointment, please call your pharmacy*  Lab Work: If you have labs (blood work) drawn today and your tests are completely normal, you will receive your results only by: [place] (if you have MyChart) OR A paper copy in the mail If you have any lab test that is abnormal or we need to change your treatment, we will call you to review the results.  Testing/Procedures: None ordered today.  Follow-Up: At [facility], you and your health needs are our priority.  As part of our continuing mission to provide you with exceptional heart care, we have created designated Provider Care Teams.  These Care Teams include your primary Cardiologist (physician) and Advanced Practice Providers (APPs -  Physician Assistants and Nurse Practitioners) who all work together to provide you with the care you need, when you need it.  We recommend signing up for the patient portal called "MyChart".  Sign up information is provided on this After Visit Summary.  MyChart is used to connect with patients for Virtual Visits (Telemedicine).  Patients are able to view lab/test results, encounter notes, upcoming appointments, etc.  Non-urgent messages can be sent to your provider as well.   To learn more about what you can do with MyChart, go to [email or web address].    Your next appointment:   12 month(s)  The format for your next appointment:   In Person  Provider:   You may see Sherren Mocha, MD or one of the following Advanced Practice Providers on your designated Care Team:   Richardson Dopp, PA-C [place], [place]

## 2021-10-05 NOTE — Patient Instructions (Addendum)
Rest and drink fluids.  Monitor pulse oximetry.  Walk some to prevent atelectasis.  Take doxycycline 100 mg twice daily for 10 days.  Hycodan 1 teaspoon every 8 hours as needed for cough.  Follow recommendations of health at work with regard to return to work.  Call if symptoms worsen.  Speak with Health at Work regarding return to work recommendations.  Return to work recommendations.

## 2021-10-05 NOTE — Progress Notes (Signed)
Cardiology Office Note:    Date:  10/05/2021   ID:  [name], DOB 1966/05/17, MRN [id number]  PCP:  Elby Showers, MD   [facility] Providers Cardiologist:  Sherren Mocha, MD     Referring MD: Elby Showers, MD   Chief Complaint  Patient presents with   Coronary Artery Disease     History of Present Illness:    [name] is a 55 y.o. female with a hx of coronary artery disease, presenting for follow-up evaluation.  The patient initially presented in 2013 with an anterior wall MI.  Cardiac catheterization demonstrated subtotal occlusion of a small diagonal branch with patency of the LAD and no other obstructive coronary disease.  LV function was within normal limits.  There was suspicion for coronary vasospasm.  The patient was treated medically with no recurrent ischemic events since that time.  Comorbid conditions include hypertension, tobacco use, gastroesophageal reflux disease, and hypothyroidism. She tested positive for Covid-19 last week (6 days ago). She is evaluated with full PPE today per office protocol. She reports a cough but it is mild. She is taking doxycycline under the care of her PCP.  From a cardiac perspective she feels like she is doing okay.  She has had no recent angina with exertion.  She denies chest pain or pressure.  No shortness of breath, orthopnea, PND, or leg swelling.  No heart palpitations.  Past Medical History:  Diagnosis Date   ADHD (attention deficit hyperactivity disorder)    Anxiety    Asthma    CAD (coronary artery disease)    a. 11/2012 Acute Anterior STEMI/Cath: Nonobs dzs except subtotal occlusion of small, 67m D2, EF 55-65%;  b. 11/2012  Echo: EF 60-65%, nl wall motion, Gr 2 DD.   Chronic kidney disease    in setting of HSP 2006   [place]    a. 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;  Service: Cardiovascular;  Laterality: N/A;    Current Medications: Current Meds  Medication Sig   albuterol (VENTOLIN HFA) 108 (90 Base) MCG/ACT inhaler Inhale 2 puffs into the lungs every 6 (six) hours as needed for wheezing or shortness of breath.   ALPRAZolam (XANAX) 0.5 MG tablet Take 1 tablet (0.5 mg total) by mouth 2 (two) times daily as needed for anxiety.   amLODipine (NORVASC) 5 MG tablet TAKE 1 TABLET (5 MG TOTAL) BY MOUTH DAILY.   aspirin EC  81 MG EC tablet Take 1 tablet (81 mg total) by mouth daily.   atorvastatin (LIPITOR) 40 MG tablet TAKE 1 TABLET BY MOUTH DAILY AT 6:00 PM   calcium-vitamin D (OSCAL WITH D) 500-200 MG-UNIT per tablet Take 1 tablet by mouth daily.   chlorhexidine (PERIDEX) 0.12 % solution RINSE MOUTH WITH 15ML (1 CAPFUL) FOR 30 SECONDS AM AND PM AFTER TOOTHBRUSHING. EXPECTORATE AFTER RINSING, DO NOT SWALLOW   Cholecalciferol (VITAMIN D3) 2000 units TABS Take 2,000 Units by mouth daily.   COVID-19 At Home Antigen Test Clear [facility] COVID-19 HOME TEST) KIT use as directed   desvenlafaxine (PRISTIQ) 50 MG 24 hr tablet TAKE 1 TABLET (50 MG TOTAL) BY MOUTH DAILY.    doxycycline (VIBRA-TABS) 100 MG tablet Take 1 tablet (100 mg total) by mouth 2 (two) times daily.   enalapril (VASOTEC) 10 MG tablet Take 1 tablet (10 mg total) by mouth daily.   HYDROcodone bit-homatropine (HYCODAN) 5-1.5 MG/5ML syrup Take 5 mLs by mouth every 8 (eight) hours as needed for cough.   HYDROcodone-acetaminophen (NORCO/VICODIN) 5-325 MG tablet Take 1 tablet by mouth 2 (two) times daily as needed.   HYDROcodone-acetaminophen (NORCO/VICODIN) 5-325 MG tablet Take 1 tablet by mouth 2 (two) times daily as needed for pain   ketoconazole (NIZORAL) 2 % cream Apply 1 application topically daily.   levothyroxine (SYNTHROID) 75 MCG tablet Take 1 tablet (75 mcg total) by mouth daily.   loratadine (CLARITIN) 10 MG tablet Take 10 mg by mouth daily.   nitroGLYCERIN (NITROSTAT) 0.4 MG SL tablet Place 1 tablet (0.4 mg total) under the tongue every 5 (five) minutes as needed for chest pain.   pantoprazole (PROTONIX) 40 MG tablet Take 1 tablet (40 mg total) by mouth 2 (two) times daily before a meal.   tiZANidine (ZANAFLEX) 2 MG tablet TAKE 1 TABLET BY MOUTH 3 TIMES A DAY AS NEEDED     Allergies:   Levaquin [levofloxacin] and Nsaids   Social History   Socioeconomic History   Marital status: Married    Spouse name: Not on file   Number of children: Not on file   Years of education: Not on file   Highest education level: Not on file  Occupational History   Occupation: RN/UC    Employer: [employer]    Comment: [employer]  Tobacco Use   Smoking status: Every Day    Packs/day: 0.50    Years: 25.00    Pack years: 12.50    Types: Cigarettes   Smokeless tobacco: Never   Tobacco comments:    smokes 1/2 pack per day   Vaping Use   Vaping Use: Never used  Substance and Sexual Activity   Alcohol use: No   Drug use: No   Sexual activity: Yes  Other Topics Concern   Not on file  Social History Narrative   Not on file   Social Determinants of Health   Financial Resource  Strain: Not on file  Food Insecurity: Not on file  Transportation Needs: Not on file  Physical Activity: Not on file  Stress: Not on file  Social Connections: Not on file     Family History: The patient's family history includes Hypertension in her mother; Stroke in her father. There is no history of Heart disease.  ROS:   Please see the history of present illness.    All other systems reviewed and are negative.  EKGs/Labs/Other Studies Reviewed:    The following studies were reviewed today: Event  Monitor 03/05/21: The basic rhythm is normal sinus with an average HR of 80 bpm No atrial fibrillation or flutter No high-grade heart block or pathologic pauses There are rare PVC's and occasional supraventricular beats without sustained arrhythmias  EKG:  EKG is ordered today.  The ekg ordered today demonstrates NSR 90 bpm, within normal limits  Recent Labs: 08/25/2021: ALT 21; BUN 12; Creat 0.83; Hemoglobin 14.8; Platelets 184; Potassium 4.3; Sodium 138; TSH 1.54  Recent Lipid Panel    Component Value Date/Time   CHOL 139 08/25/2021 0947   TRIG 110 08/25/2021 0947   HDL 51 08/25/2021 0947   CHOLHDL 2.7 08/25/2021 0947   VLDL 23 06/21/2017 1014   LDLCALC 68 08/25/2021 0947     Risk Assessment/Calculations:           Physical Exam:    VS:  BP (!) 138/92   Pulse 90   Ht _0  (1.651 m)   Wt 186 lb 3.2 oz (84.5 kg)   SpO2 95%   BMI 30.99 kg/m     Wt Readings from Last 3 Encounters:  10/05/21 186 lb 3.2 oz (84.5 kg)  09/21/21 186 lb (84.4 kg)  09/21/21 187 lb (84.8 kg)     GEN:  Well nourished, well developed in no acute distress HEENT: Normal NECK: No JVD; No carotid bruits LYMPHATICS: No lymphadenopathy CARDIAC: RRR, no murmurs, rubs, gallops RESPIRATORY:  Clear to auscultation without rales, wheezing or rhonchi  ABDOMEN: Soft, non-tender, non-distended MUSCULOSKELETAL:  No edema; No deformity  SKIN: Warm and dry NEUROLOGIC:  Alert and oriented x  3 PSYCHIATRIC:  Normal affect   ASSESSMENT:    1. Coronary artery disease involving native coronary artery of native heart without angina pectoris   2. Essential hypertension   3. Mixed hyperlipidemia   4. Tobacco use    PLAN:    In order of problems listed above:  Continue amlodipine for antianginal therapy and blood pressure control.  Continue aspirin for antiplatelet therapy and high intensity statin drug with atorvastatin 40 mg daily.  Overall appears to be stable. Blood pressure control has been adequate as an outpatient.  She is treated with Vasotec and amlodipine.  Her blood pressure is elevated in the office today.  She was not expecting our staff to have to wear PPE for her recent COVID infection and she had previously called in the office to ask about this prior to her appointment.  She was upset about the situation.  Her last blood pressure just a few weeks ago was 108/68.  I think we should continue her current medicines. Treated with atorvastatin.  Last cholesterol panel showed a total cholesterol 139, HDL 51, LDL 68.  ALT is 21. Cessation counseling done.     Medication Adjustments/Labs and Tests Ordered: Current medicines are reviewed at length with the patient today.  Concerns regarding medicines are outlined above.  Orders Placed This Encounter  Procedures   EKG 12-Lead    No orders of the defined types were placed in this encounter.   Patient Instructions  Medication Instructions:  *If you need a refill on your cardiac medications before your next appointment, please call your pharmacy*  Lab Work: If you have labs (blood work) drawn today and your tests are completely normal, you will receive your results only by: [place] (if you have MyChart) OR A paper copy in the mail If you have any lab test that is abnormal or we need to change your treatment, we will call  you to review the results.  Testing/Procedures: None ordered today.  Follow-Up: At  [facility], you and your health needs are our priority.  As part of our continuing mission to provide you with exceptional heart care, we have created designated Provider Care Teams.  These Care Teams include your primary Cardiologist (physician) and Advanced Practice Providers (APPs -  Physician Assistants and Nurse Practitioners) who all work together to provide you with the care you need, when you need it.  We recommend signing up for the patient portal called "MyChart".  Sign up information is provided on this After Visit Summary.  MyChart is used to connect with patients for Virtual Visits (Telemedicine).  Patients are able to view lab/test results, encounter notes, upcoming appointments, etc.  Non-urgent messages can be sent to your provider as well.   To learn more about what you can do with MyChart, go to [email or web address].    Your next appointment:   12 month(s)  The format for your next appointment:   In Person  Provider:   You may see Sherren Mocha, MD or one of the following Advanced Practice Providers on your designated Care Team:   Richardson Dopp, PA-C Robbie Lis, [place]     Signed, Sherren Mocha, MD  10/05/2021 12:39 PM    [place]

## 2021-10-07 ENCOUNTER — Other Ambulatory Visit: Payer: Self-pay | Admitting: Cardiovascular Disease

## 2021-10-07 DIAGNOSIS — I10 Essential (primary) hypertension: Secondary | ICD-10-CM

## 2021-10-07 DIAGNOSIS — I2510 Atherosclerotic heart disease of native coronary artery without angina pectoris: Secondary | ICD-10-CM

## 2021-10-08 ENCOUNTER — Other Ambulatory Visit (HOSPITAL_COMMUNITY): Payer: Self-pay

## 2021-10-08 MED ORDER — ATORVASTATIN CALCIUM 40 MG PO TABS
40.0000 mg | ORAL_TABLET | Freq: Every day | ORAL | 2 refills | Status: DC
  Filled 2021-10-08: qty 90, 90d supply, fill #0
  Filled 2022-01-25: qty 90, 90d supply, fill #1
  Filled 2022-04-26: qty 90, 90d supply, fill #2

## 2021-10-09 ENCOUNTER — Telehealth: Payer: Self-pay | Admitting: Internal Medicine

## 2021-10-09 NOTE — Telephone Encounter (Signed)
[place] results to [facility] [phone], phone [phone]  +Covid 09/30/2021

## 2021-10-13 ENCOUNTER — Other Ambulatory Visit (HOSPITAL_COMMUNITY): Payer: Self-pay

## 2021-10-13 DIAGNOSIS — M542 Cervicalgia: Secondary | ICD-10-CM | POA: Diagnosis not present

## 2021-10-13 DIAGNOSIS — M2550 Pain in unspecified joint: Secondary | ICD-10-CM | POA: Diagnosis not present

## 2021-10-13 DIAGNOSIS — M47817 Spondylosis without myelopathy or radiculopathy, lumbosacral region: Secondary | ICD-10-CM | POA: Diagnosis not present

## 2021-10-13 DIAGNOSIS — G894 Chronic pain syndrome: Secondary | ICD-10-CM | POA: Diagnosis not present

## 2021-10-13 MED ORDER — TIZANIDINE HCL 2 MG PO TABS
2.0000 mg | ORAL_TABLET | Freq: Three times a day (TID) | ORAL | 1 refills | Status: DC | PRN
  Filled 2021-10-13: qty 90, 30d supply, fill #0

## 2021-10-13 MED ORDER — HYDROCODONE-ACETAMINOPHEN 5-325 MG PO TABS
1.0000 | ORAL_TABLET | Freq: Two times a day (BID) | ORAL | 0 refills | Status: DC | PRN
  Filled 2021-11-02: qty 60, 30d supply, fill #0

## 2021-10-13 MED ORDER — HYDROCODONE-ACETAMINOPHEN 5-325 MG PO TABS
1.0000 | ORAL_TABLET | Freq: Two times a day (BID) | ORAL | 0 refills | Status: DC | PRN
  Filled 2021-12-01: qty 60, 30d supply, fill #0

## 2021-10-21 ENCOUNTER — Other Ambulatory Visit (HOSPITAL_COMMUNITY): Payer: Self-pay

## 2021-10-26 ENCOUNTER — Other Ambulatory Visit (HOSPITAL_COMMUNITY): Payer: Self-pay

## 2021-10-26 ENCOUNTER — Other Ambulatory Visit: Payer: Self-pay | Admitting: Cardiovascular Disease

## 2021-10-26 ENCOUNTER — Other Ambulatory Visit: Payer: Self-pay | Admitting: Internal Medicine

## 2021-10-26 DIAGNOSIS — I10 Essential (primary) hypertension: Secondary | ICD-10-CM

## 2021-10-26 DIAGNOSIS — I2510 Atherosclerotic heart disease of native coronary artery without angina pectoris: Secondary | ICD-10-CM

## 2021-10-26 MED ORDER — DESVENLAFAXINE SUCCINATE ER 50 MG PO TB24
50.0000 mg | ORAL_TABLET | Freq: Every day | ORAL | 2 refills | Status: DC
  Filled 2021-10-26: qty 90, 90d supply, fill #0
  Filled 2022-01-25: qty 90, 90d supply, fill #1
  Filled 2022-04-26: qty 90, 90d supply, fill #2

## 2021-10-26 MED ORDER — AMLODIPINE BESYLATE 5 MG PO TABS
5.0000 mg | ORAL_TABLET | Freq: Every day | ORAL | 3 refills | Status: DC
  Filled 2021-10-26: qty 90, 90d supply, fill #0
  Filled 2022-01-25: qty 90, 90d supply, fill #1
  Filled 2022-04-26: qty 90, 90d supply, fill #2
  Filled 2022-07-20: qty 90, 90d supply, fill #3

## 2021-11-02 ENCOUNTER — Other Ambulatory Visit (HOSPITAL_COMMUNITY): Payer: Self-pay

## 2021-11-04 ENCOUNTER — Other Ambulatory Visit (HOSPITAL_COMMUNITY): Payer: Self-pay

## 2021-11-17 ENCOUNTER — Telehealth (HOSPITAL_COMMUNITY): Payer: Self-pay | Admitting: Family Medicine

## 2021-11-17 MED ORDER — DOXYCYCLINE HYCLATE 100 MG PO CAPS: 100.0000 mg | ORAL_CAPSULE | Freq: Two times a day (BID) | ORAL | 0 refills | Status: DC

## 2021-11-17 NOTE — Telephone Encounter (Signed)
Doxycyline sent to pharmacy for acute cystitis

## 2021-11-22 ENCOUNTER — Encounter: Payer: Self-pay | Admitting: Internal Medicine

## 2021-12-01 ENCOUNTER — Other Ambulatory Visit (HOSPITAL_COMMUNITY): Payer: Self-pay

## 2021-12-08 ENCOUNTER — Other Ambulatory Visit (HOSPITAL_COMMUNITY): Payer: Self-pay

## 2021-12-08 DIAGNOSIS — M2550 Pain in unspecified joint: Secondary | ICD-10-CM | POA: Diagnosis not present

## 2021-12-08 DIAGNOSIS — M542 Cervicalgia: Secondary | ICD-10-CM | POA: Diagnosis not present

## 2021-12-08 DIAGNOSIS — G894 Chronic pain syndrome: Secondary | ICD-10-CM | POA: Diagnosis not present

## 2021-12-08 DIAGNOSIS — M47817 Spondylosis without myelopathy or radiculopathy, lumbosacral region: Secondary | ICD-10-CM | POA: Diagnosis not present

## 2021-12-08 MED ORDER — HYDROCODONE-ACETAMINOPHEN 5-325 MG PO TABS
1.0000 | ORAL_TABLET | Freq: Two times a day (BID) | ORAL | 0 refills | Status: DC | PRN
Start: 2021-12-30 — End: 2023-03-15
  Filled 2022-01-01: qty 60, 30d supply, fill #0

## 2021-12-08 MED ORDER — TIZANIDINE HCL 2 MG PO TABS
2.0000 mg | ORAL_TABLET | Freq: Three times a day (TID) | ORAL | 1 refills | Status: DC | PRN
  Filled 2021-12-08: qty 90, 30d supply, fill #0

## 2021-12-08 MED ORDER — HYDROCODONE-ACETAMINOPHEN 5-325 MG PO TABS
1.0000 | ORAL_TABLET | Freq: Two times a day (BID) | ORAL | 0 refills | Status: DC | PRN
  Filled 2022-03-04: qty 60, 30d supply, fill #0

## 2021-12-16 ENCOUNTER — Other Ambulatory Visit (HOSPITAL_COMMUNITY): Payer: Self-pay

## 2022-01-01 ENCOUNTER — Other Ambulatory Visit (HOSPITAL_COMMUNITY): Payer: Self-pay

## 2022-01-13 DIAGNOSIS — Z124 Encounter for screening for malignant neoplasm of cervix: Secondary | ICD-10-CM | POA: Diagnosis not present

## 2022-01-13 DIAGNOSIS — Z6831 Body mass index (BMI) 31.0-31.9, adult: Secondary | ICD-10-CM | POA: Diagnosis not present

## 2022-01-13 DIAGNOSIS — Z1151 Encounter for screening for human papillomavirus (HPV): Secondary | ICD-10-CM | POA: Diagnosis not present

## 2022-01-13 DIAGNOSIS — Z01419 Encounter for gynecological examination (general) (routine) without abnormal findings: Secondary | ICD-10-CM | POA: Diagnosis not present

## 2022-01-13 LAB — HM PAP SMEAR: HM Pap smear: NEGATIVE

## 2022-01-26 ENCOUNTER — Other Ambulatory Visit (HOSPITAL_COMMUNITY): Payer: Self-pay

## 2022-02-02 ENCOUNTER — Other Ambulatory Visit (HOSPITAL_COMMUNITY): Payer: Self-pay

## 2022-02-02 DIAGNOSIS — M47817 Spondylosis without myelopathy or radiculopathy, lumbosacral region: Secondary | ICD-10-CM | POA: Diagnosis not present

## 2022-02-02 DIAGNOSIS — G894 Chronic pain syndrome: Secondary | ICD-10-CM | POA: Diagnosis not present

## 2022-02-02 DIAGNOSIS — M2550 Pain in unspecified joint: Secondary | ICD-10-CM | POA: Diagnosis not present

## 2022-02-02 DIAGNOSIS — M542 Cervicalgia: Secondary | ICD-10-CM | POA: Diagnosis not present

## 2022-02-02 MED ORDER — TIZANIDINE HCL 2 MG PO TABS
2.0000 mg | ORAL_TABLET | Freq: Three times a day (TID) | ORAL | 1 refills | Status: AC | PRN
  Filled 2022-02-02: qty 90, 30d supply, fill #0

## 2022-02-02 MED ORDER — HYDROCODONE-ACETAMINOPHEN 5-325 MG PO TABS
1.0000 | ORAL_TABLET | Freq: Two times a day (BID) | ORAL | 0 refills | Status: DC | PRN
Start: 2022-02-02 — End: 2023-03-15
  Filled 2022-02-02: qty 60, 30d supply, fill #0

## 2022-02-02 MED ORDER — HYDROCODONE-ACETAMINOPHEN 5-325 MG PO TABS
1.0000 | ORAL_TABLET | Freq: Two times a day (BID) | ORAL | 0 refills | Status: DC | PRN
Start: 2022-03-02 — End: 2023-03-15
  Filled 2022-05-04: qty 60, 30d supply, fill #0

## 2022-02-04 DIAGNOSIS — H43811 Vitreous degeneration, right eye: Secondary | ICD-10-CM | POA: Diagnosis not present

## 2022-02-04 DIAGNOSIS — H2513 Age-related nuclear cataract, bilateral: Secondary | ICD-10-CM | POA: Diagnosis not present

## 2022-02-04 DIAGNOSIS — H04123 Dry eye syndrome of bilateral lacrimal glands: Secondary | ICD-10-CM | POA: Diagnosis not present

## 2022-02-04 DIAGNOSIS — H40013 Open angle with borderline findings, low risk, bilateral: Secondary | ICD-10-CM | POA: Diagnosis not present

## 2022-02-23 ENCOUNTER — Other Ambulatory Visit: Payer: Self-pay

## 2022-02-23 ENCOUNTER — Other Ambulatory Visit: Payer: 59

## 2022-02-23 DIAGNOSIS — E785 Hyperlipidemia, unspecified: Secondary | ICD-10-CM

## 2022-02-23 DIAGNOSIS — R7302 Impaired glucose tolerance (oral): Secondary | ICD-10-CM

## 2022-02-24 LAB — HEPATIC FUNCTION PANEL
AG Ratio: 1.9 (calc) (ref 1.0–2.5)
ALT: 25 U/L (ref 6–29)
AST: 25 U/L (ref 10–35)
Albumin: 4.3 g/dL (ref 3.6–5.1)
Alkaline phosphatase (APISO): 78 U/L (ref 37–153)
Bilirubin, Direct: 0.1 mg/dL (ref 0.0–0.2)
Globulin: 2.3 g/dL (calc) (ref 1.9–3.7)
Indirect Bilirubin: 0.4 mg/dL (calc) (ref 0.2–1.2)
Total Bilirubin: 0.5 mg/dL (ref 0.2–1.2)
Total Protein: 6.6 g/dL (ref 6.1–8.1)

## 2022-02-24 LAB — LIPID PANEL
Cholesterol: 154 mg/dL (ref <200)
HDL: 59 mg/dL (ref >=50)
LDL Cholesterol (Calc): 72 mg/dL (calc)
Non-HDL Cholesterol (Calc): 95 mg/dL (calc) (ref <130)
Total CHOL/HDL Ratio: 2.6 (calc) (ref <5.0)
Triglycerides: 155 mg/dL — ABNORMAL HIGH (ref <150)

## 2022-02-24 LAB — HEMOGLOBIN A1C
Hgb A1c MFr Bld: 5.5 % of total Hgb (ref <5.7)
Mean Plasma Glucose: 111 mg/dL
eAG (mmol/L): 6.2 mmol/L

## 2022-02-25 ENCOUNTER — Ambulatory Visit: Payer: 59 | Admitting: Internal Medicine

## 2022-02-25 ENCOUNTER — Encounter: Payer: Self-pay | Admitting: Internal Medicine

## 2022-02-25 ENCOUNTER — Other Ambulatory Visit: Payer: Self-pay

## 2022-02-25 VITALS — BP 120/82 | HR 68 | Temp 97.2°F | Ht 65.0 in | Wt 188.5 lb

## 2022-02-25 DIAGNOSIS — Z6831 Body mass index (BMI) 31.0-31.9, adult: Secondary | ICD-10-CM | POA: Diagnosis not present

## 2022-02-25 DIAGNOSIS — E039 Hypothyroidism, unspecified: Secondary | ICD-10-CM | POA: Diagnosis not present

## 2022-02-25 DIAGNOSIS — I10 Essential (primary) hypertension: Secondary | ICD-10-CM

## 2022-02-25 DIAGNOSIS — Z8679 Personal history of other diseases of the circulatory system: Secondary | ICD-10-CM | POA: Diagnosis not present

## 2022-02-25 DIAGNOSIS — F32A Depression, unspecified: Secondary | ICD-10-CM

## 2022-02-25 DIAGNOSIS — Z23 Encounter for immunization: Secondary | ICD-10-CM | POA: Diagnosis not present

## 2022-02-25 DIAGNOSIS — I252 Old myocardial infarction: Secondary | ICD-10-CM | POA: Diagnosis not present

## 2022-02-25 DIAGNOSIS — G8929 Other chronic pain: Secondary | ICD-10-CM

## 2022-02-25 DIAGNOSIS — R7302 Impaired glucose tolerance (oral): Secondary | ICD-10-CM

## 2022-02-25 DIAGNOSIS — F17200 Nicotine dependence, unspecified, uncomplicated: Secondary | ICD-10-CM

## 2022-02-25 DIAGNOSIS — M7918 Myalgia, other site: Secondary | ICD-10-CM

## 2022-02-25 DIAGNOSIS — G4733 Obstructive sleep apnea (adult) (pediatric): Secondary | ICD-10-CM | POA: Diagnosis not present

## 2022-02-25 DIAGNOSIS — F419 Anxiety disorder, unspecified: Secondary | ICD-10-CM

## 2022-02-25 NOTE — Progress Notes (Signed)
? ?  Subjective:  ? ? Patient ID: [name], female    DOB: September 04, 1966, 56 y.o.   MRN: [id number] ? ?HPI  56 year old Female seen for 6 month recheck.  She saw Dr. Nori Riis in early February for GYN exam.  She sees Dr. Greta Doom for chronic pain management.  She has chronic low back pain and cervicalgia. ? ?History of chronic left hip pain seen by Dr. Ninfa Linden in October 2022.  Lumbar spine x-rays were unremarkable except for slight degenerative changes between L5 and S1.  Voltaren gel was recommended for left hip trochanteric bursitis and IT band syndrome.  He reported she could have a steroid injection if not improved. ? ?She saw Dr. Burt Knack in late October 2022 for cardiology follow-up.  History of anterior wall MI in 2013.  Cardiac cath demonstrated subtotal occlusion of a small diagonal branch with patency of the LAD and no other obstructive coronary disease.  Left ventricular function was normal.  There was suspicion for coronary vasospasm.  She was treated medically.  He recommended she stay on amlodipine for antianginal therapy and blood pressure control.  Recommended that she continue with aspirin and high intensity statin drug with atorvastatin 40 mg daily.  He thought blood pressure control was adequate on Vasotec and amlodipine.  Was advised to quit smoking. ? ?Regarding impaired glucose tolerance her hemoglobin A1c is excellent at 5.5%. ? ?Regarding lipid control, her lipids are excellent with the exception of triglycerides slightly elevated at 155.  LDL of 72 and cholesterol total is 154.  HDL was 59.  Liver functions are normal on statin medication. ? ?Review of Systems see above no complaint of chest pain or shortness of breath ? ?   ?Objective:  ? Physical Exam ?Blood pressure 120/82, pulse 68, temperature 97.2 degrees, pulse oximetry 98% weight 188 pounds 8 ounces.  BMI 31.37 ? ?Skin: Warm and dry.  No cervical adenopathy or megaly.  No bruits.  Chest clear.  Card exam: Regular ectopy.  No  lower extremity pitting edema. ? ?   ?Assessment & Plan:  ?Coronary artery disease status post acute anterior wall MI in 2013 followed by Dr. Burt Knack ? ?Chronic musculoskeletal pain treated with hydrocodone APAP by Dr. Greta Doom.  Takes Zanaflex 2 mg 3 times a day as needed in addition to hydrocodone APAP. ? ?Hypothyroidism treated with levothyroxine 75 mcg daily.  TSH not checked with this visit ? ?History of reactive airways disease treated with as needed Ventolin inhaler ? ?Anxiety treated with Xanax 0.5 mg twice daily as needed ? ?BMI 31.37-continue with diet and exercise efforts.  She weighed 186 pounds in October 2022 ? ?History of COVID-23 September 2021 and recovered without sequelae ? ?Hyperlipidemia treated with statin medication and stable.  Triglycerides slightly elevated 155.  Currently on Lipitor 40 mg daily. ? ?GE reflux treated with Protonix 40 mg daily ? ?History of depression treated with Pristiq ? ?Essential hypertension treated with amlodipine 5 mg daily ? ?History of smoking ? ?History of sleep apnea seen by Dr. Vaughan Browner in 2020. ? ?History of E. coli UTI October 2022 ? ?Plan: Lipid panel and hemoglobin A1c are stable at this point.  TSH was checked in September and was stable.  Pneumococcal 20 vaccine given today.  She had annual flu vaccine in October 2022 and has had 5 COVID vaccines.  Return in 6 months for health maintenance exam. ? ?

## 2022-02-25 NOTE — Patient Instructions (Addendum)
Labs are stable.  RTC for CPE and health maintenance exam in 6 months.  Please work on diet exercise and weight loss.  Pneumococcal 20 vaccine given today. ?

## 2022-03-03 ENCOUNTER — Other Ambulatory Visit: Payer: Self-pay | Admitting: Internal Medicine

## 2022-03-03 DIAGNOSIS — I10 Essential (primary) hypertension: Secondary | ICD-10-CM

## 2022-03-03 DIAGNOSIS — I2510 Atherosclerotic heart disease of native coronary artery without angina pectoris: Secondary | ICD-10-CM

## 2022-03-04 ENCOUNTER — Other Ambulatory Visit (HOSPITAL_COMMUNITY): Payer: Self-pay

## 2022-03-04 MED ORDER — ENALAPRIL MALEATE 10 MG PO TABS
10.0000 mg | ORAL_TABLET | Freq: Every day | ORAL | 2 refills | Status: DC
  Filled 2022-03-04: qty 90, 90d supply, fill #0
  Filled 2022-06-01: qty 90, 90d supply, fill #1
  Filled 2022-08-29: qty 90, 90d supply, fill #2

## 2022-03-17 ENCOUNTER — Other Ambulatory Visit (HOSPITAL_COMMUNITY): Payer: Self-pay

## 2022-03-17 ENCOUNTER — Other Ambulatory Visit: Payer: Self-pay | Admitting: Internal Medicine

## 2022-03-17 MED ORDER — LEVOTHYROXINE SODIUM 75 MCG PO TABS
75.0000 ug | ORAL_TABLET | Freq: Every day | ORAL | 1 refills | Status: DC
  Filled 2022-03-17: qty 90, 90d supply, fill #0
  Filled 2022-06-17: qty 90, 90d supply, fill #1

## 2022-03-30 ENCOUNTER — Other Ambulatory Visit (HOSPITAL_COMMUNITY): Payer: Self-pay

## 2022-03-30 DIAGNOSIS — M542 Cervicalgia: Secondary | ICD-10-CM | POA: Diagnosis not present

## 2022-03-30 DIAGNOSIS — M2550 Pain in unspecified joint: Secondary | ICD-10-CM | POA: Diagnosis not present

## 2022-03-30 DIAGNOSIS — M47817 Spondylosis without myelopathy or radiculopathy, lumbosacral region: Secondary | ICD-10-CM | POA: Diagnosis not present

## 2022-03-30 DIAGNOSIS — G894 Chronic pain syndrome: Secondary | ICD-10-CM | POA: Diagnosis not present

## 2022-03-30 MED ORDER — HYDROCODONE-ACETAMINOPHEN 5-325 MG PO TABS
1.0000 | ORAL_TABLET | Freq: Two times a day (BID) | ORAL | 0 refills | Status: DC | PRN
  Filled 2022-04-05: qty 60, 30d supply, fill #0

## 2022-03-30 MED ORDER — HYDROCODONE-ACETAMINOPHEN 5-325 MG PO TABS
1.0000 | ORAL_TABLET | Freq: Two times a day (BID) | ORAL | 0 refills | Status: DC | PRN
  Filled 2022-06-03: qty 60, 30d supply, fill #0

## 2022-04-05 ENCOUNTER — Other Ambulatory Visit (HOSPITAL_COMMUNITY): Payer: Self-pay

## 2022-04-26 ENCOUNTER — Other Ambulatory Visit (HOSPITAL_COMMUNITY): Payer: Self-pay

## 2022-05-04 ENCOUNTER — Other Ambulatory Visit (HOSPITAL_COMMUNITY): Payer: Self-pay

## 2022-05-25 ENCOUNTER — Other Ambulatory Visit (HOSPITAL_COMMUNITY): Payer: Self-pay

## 2022-05-25 DIAGNOSIS — G894 Chronic pain syndrome: Secondary | ICD-10-CM | POA: Diagnosis not present

## 2022-05-25 DIAGNOSIS — M542 Cervicalgia: Secondary | ICD-10-CM | POA: Diagnosis not present

## 2022-05-25 DIAGNOSIS — Z79891 Long term (current) use of opiate analgesic: Secondary | ICD-10-CM | POA: Diagnosis not present

## 2022-05-25 DIAGNOSIS — M2550 Pain in unspecified joint: Secondary | ICD-10-CM | POA: Diagnosis not present

## 2022-05-25 DIAGNOSIS — M47817 Spondylosis without myelopathy or radiculopathy, lumbosacral region: Secondary | ICD-10-CM | POA: Diagnosis not present

## 2022-05-25 MED ORDER — HYDROCODONE-ACETAMINOPHEN 5-325 MG PO TABS
1.0000 | ORAL_TABLET | Freq: Two times a day (BID) | ORAL | 0 refills | Status: DC | PRN
  Filled 2022-05-25 – 2022-07-05 (×2): qty 60, 30d supply, fill #0

## 2022-05-25 MED ORDER — HYDROCODONE-ACETAMINOPHEN 5-325 MG PO TABS
1.0000 | ORAL_TABLET | Freq: Two times a day (BID) | ORAL | 0 refills | Status: DC | PRN
  Filled 2022-09-03: qty 60, 30d supply, fill #0

## 2022-06-01 ENCOUNTER — Other Ambulatory Visit (HOSPITAL_COMMUNITY): Payer: Self-pay

## 2022-06-01 ENCOUNTER — Ambulatory Visit: Payer: 59 | Admitting: Internal Medicine

## 2022-06-01 ENCOUNTER — Telehealth: Payer: Self-pay

## 2022-06-01 ENCOUNTER — Encounter: Payer: Self-pay | Admitting: Internal Medicine

## 2022-06-01 VITALS — BP 118/78 | HR 72 | Temp 98.6°F | Ht 65.0 in | Wt 190.5 lb

## 2022-06-01 DIAGNOSIS — Z8679 Personal history of other diseases of the circulatory system: Secondary | ICD-10-CM | POA: Diagnosis not present

## 2022-06-01 DIAGNOSIS — H6690 Otitis media, unspecified, unspecified ear: Secondary | ICD-10-CM | POA: Diagnosis not present

## 2022-06-01 DIAGNOSIS — H6500 Acute serous otitis media, unspecified ear: Secondary | ICD-10-CM

## 2022-06-01 DIAGNOSIS — R7302 Impaired glucose tolerance (oral): Secondary | ICD-10-CM

## 2022-06-01 MED ORDER — CLARITHROMYCIN 500 MG PO TABS
500.0000 mg | ORAL_TABLET | Freq: Two times a day (BID) | ORAL | 0 refills | Status: DC
  Filled 2022-06-01: qty 20, 10d supply, fill #0

## 2022-06-01 MED ORDER — CEFTRIAXONE SODIUM 1 G IJ SOLR
1.0000 g | Freq: Once | INTRAMUSCULAR | Status: AC
  Administered 2022-06-01: 1 g via INTRAMUSCULAR

## 2022-06-01 MED ORDER — METHYLPREDNISOLONE ACETATE 80 MG/ML IJ SUSP
80.0000 mg | Freq: Once | INTRAMUSCULAR | Status: AC
  Administered 2022-06-01: 80 mg via INTRAMUSCULAR

## 2022-06-01 MED ORDER — HYDROCODONE BIT-HOMATROP MBR 5-1.5 MG/5ML PO SOLN
5.0000 mL | Freq: Three times a day (TID) | ORAL | 0 refills | Status: DC | PRN
  Filled 2022-06-01: qty 120, 8d supply, fill #0

## 2022-06-01 NOTE — Telephone Encounter (Signed)
Appt made

## 2022-06-01 NOTE — Progress Notes (Signed)
   Subjective:    Patient ID: Damian Leavell, female    DOB: 05-13-1966, 56 y.o.   MRN: [id number]  HPI  [name] went to [place] last week via airplane to visit her son. Did not have any URI symptoms prior to departure.   Ear became stopped up at landing there. She returned here late Sunday night. Tried Claritin and also took Benadryl last night. Some itching in ear.Not hearing well out of left ear.   Intermittent Sharp pain and some soreness in left ear.No fever.No chills. No headache. Has not taken a Covid test.  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  History of coronary artery disease.  She has impaired glucose tolerance.  History of hyperlipidemia treated with statin medication.  Chronic musculoskeletal pain and is enrolled in chronic pain management with Dr. Greta Doom.  History of hypothyroidism on thyroid replacement medication.  History of reactive airways disease treated with as needed Ventolin.  Takes Xanax as needed for anxiety.  History of GE reflux treated with Protonix.  History of smoking.  History of sleep apnea.    Review of Systems has malaise and fatigue.  Ears have been popping.  Sounds are muffled.  Intermittent ear pain.     Objective:   Physical Exam Blood pressure 118/78, pulse 72, temperature 98.6 degrees, pulse oximetry 98% on room air,weight 190 pounds 8 ounces BMI 31.70 Skin warm and dry.  No cervical adenopathy.  Pharynx slightly injected without exudate.      Assessment & Plan:  Otitis media-take Biaxin 500 mg twice daily for 10 days.  Given 1 g ceftriaxone injection  IM in office.  Rest and drink fluids.  Take Hycodan 1 teaspoon every 8 hours as needed for cough and sore throat pain.  Depo-Medrol 80 mg IM given in office  Recent airplane travel with air pressure changes likely contributing to otitis media  Hypertension-blood pressure reading stable at 118/78 on current regimen  Coronary disease followed by Dr.  Burt Knack  Impaired glucose tolerance  Chronic musculoskeletal pain-enrolled in pain management.  Treated with hydrocodone APAP and Zanaflex.  History of depression and situational stress treated with Pristiq  Plan: See above regarding treatment given in office today and medications prescribed.  Rest and drink fluids.  Call if not better in 48 to 72 hours or sooner if worse.

## 2022-06-03 ENCOUNTER — Other Ambulatory Visit (HOSPITAL_COMMUNITY): Payer: Self-pay

## 2022-06-15 ENCOUNTER — Other Ambulatory Visit (HOSPITAL_COMMUNITY): Payer: Self-pay

## 2022-06-17 ENCOUNTER — Telehealth: Payer: Self-pay | Admitting: Internal Medicine

## 2022-06-17 ENCOUNTER — Other Ambulatory Visit (HOSPITAL_COMMUNITY): Payer: Self-pay

## 2022-06-17 NOTE — Telephone Encounter (Signed)
Faxed signed form back to below pharmacy of approval for to change manufacture on below medication.  levothyroxine (SYNTHROID) 75 MCG tablet  [facility] Phone:  [phone]  Fax:  [phone]

## 2022-06-17 NOTE — Telephone Encounter (Signed)
This message was sent via [place], a product from [employer] http://www.[employer].com/                    -------Fax Transmission Report-------  To:               Recipient at [phone] Subject:          FW: Hp Scans Result:           The transmission was successful. Explanation:      All Pages Ok Pages Sent:       2 Connect Time:     1 minutes, 31 seconds Transmit Time:    06/17/2022 14:43 Transfer Rate:    14400 Status Code:      0000 Retry Count:      0 Job Id:           [id number] Unique Id:        [id number] Fax Line:         46 Fax Server:       [employer]

## 2022-06-20 NOTE — Patient Instructions (Addendum)
Take Biaxin 500 mg twice daily for 10 days.  Ceftriaxone 1 g IM given in office.  Rest and drink fluids.  Depo-Medrol 80 mg IM given in office.  Take Hycodan 1 teaspoon every 8 hours as needed for cough/sore throat pain.  Call if not better in 48 to 72 hours or sooner if worse.

## 2022-06-23 ENCOUNTER — Encounter: Payer: Self-pay | Admitting: Internal Medicine

## 2022-06-23 ENCOUNTER — Other Ambulatory Visit (HOSPITAL_COMMUNITY): Payer: Self-pay

## 2022-06-23 MED ORDER — CLARITHROMYCIN 500 MG PO TABS
500.0000 mg | ORAL_TABLET | Freq: Two times a day (BID) | ORAL | 0 refills | Status: AC
  Filled 2022-06-23: qty 20, 10d supply, fill #0

## 2022-06-23 NOTE — Telephone Encounter (Signed)
Patient notified that the medication had been refilled and to call us for evaluation if no help.

## 2022-07-05 ENCOUNTER — Other Ambulatory Visit (HOSPITAL_COMMUNITY): Payer: Self-pay

## 2022-07-19 ENCOUNTER — Other Ambulatory Visit (HOSPITAL_COMMUNITY): Payer: Self-pay

## 2022-07-19 DIAGNOSIS — M2550 Pain in unspecified joint: Secondary | ICD-10-CM | POA: Diagnosis not present

## 2022-07-19 DIAGNOSIS — M542 Cervicalgia: Secondary | ICD-10-CM | POA: Diagnosis not present

## 2022-07-19 DIAGNOSIS — M47817 Spondylosis without myelopathy or radiculopathy, lumbosacral region: Secondary | ICD-10-CM | POA: Diagnosis not present

## 2022-07-19 DIAGNOSIS — G894 Chronic pain syndrome: Secondary | ICD-10-CM | POA: Diagnosis not present

## 2022-07-19 MED ORDER — HYDROCODONE-ACETAMINOPHEN 5-325 MG PO TABS
1.0000 | ORAL_TABLET | Freq: Two times a day (BID) | ORAL | 0 refills | Status: DC | PRN
  Filled 2022-08-04: qty 60, 30d supply, fill #0

## 2022-07-19 MED ORDER — HYDROCODONE-ACETAMINOPHEN 5-325 MG PO TABS
1.0000 | ORAL_TABLET | Freq: Two times a day (BID) | ORAL | 0 refills | Status: DC | PRN
  Filled 2023-01-07: qty 60, 30d supply, fill #0

## 2022-07-20 ENCOUNTER — Other Ambulatory Visit: Payer: Self-pay | Admitting: Internal Medicine

## 2022-07-20 ENCOUNTER — Other Ambulatory Visit (HOSPITAL_COMMUNITY): Payer: Self-pay

## 2022-07-20 MED ORDER — ALBUTEROL SULFATE HFA 108 (90 BASE) MCG/ACT IN AERS
2.0000 | INHALATION_SPRAY | Freq: Four times a day (QID) | RESPIRATORY_TRACT | 11 refills | Status: DC | PRN
  Filled 2022-07-20: qty 8.5, 25d supply, fill #0
  Filled 2022-07-20: qty 6.7, 25d supply, fill #0
  Filled 2022-12-13: qty 6.7, 25d supply, fill #1

## 2022-07-20 MED ORDER — DESVENLAFAXINE SUCCINATE ER 50 MG PO TB24
50.0000 mg | ORAL_TABLET | Freq: Every day | ORAL | 2 refills | Status: DC
  Filled 2022-07-20: qty 90, 90d supply, fill #0
  Filled 2022-10-19: qty 90, 90d supply, fill #1
  Filled 2023-01-17: qty 90, 90d supply, fill #2

## 2022-07-20 MED ORDER — PANTOPRAZOLE SODIUM 40 MG PO TBEC
40.0000 mg | DELAYED_RELEASE_TABLET | Freq: Two times a day (BID) | ORAL | 3 refills | Status: DC
  Filled 2022-07-20: qty 180, 90d supply, fill #0
  Filled 2022-10-21: qty 180, 90d supply, fill #1
  Filled 2023-01-17: qty 180, 90d supply, fill #2
  Filled 2023-04-20: qty 180, 90d supply, fill #3

## 2022-08-04 ENCOUNTER — Other Ambulatory Visit (HOSPITAL_COMMUNITY): Payer: Self-pay

## 2022-08-30 ENCOUNTER — Other Ambulatory Visit: Payer: 59

## 2022-08-30 ENCOUNTER — Other Ambulatory Visit (HOSPITAL_COMMUNITY): Payer: Self-pay

## 2022-08-30 DIAGNOSIS — I10 Essential (primary) hypertension: Secondary | ICD-10-CM

## 2022-08-30 DIAGNOSIS — R7302 Impaired glucose tolerance (oral): Secondary | ICD-10-CM | POA: Diagnosis not present

## 2022-08-30 DIAGNOSIS — F32A Depression, unspecified: Secondary | ICD-10-CM

## 2022-08-30 DIAGNOSIS — Z13220 Encounter for screening for lipoid disorders: Secondary | ICD-10-CM

## 2022-08-30 DIAGNOSIS — F419 Anxiety disorder, unspecified: Secondary | ICD-10-CM | POA: Diagnosis not present

## 2022-08-30 DIAGNOSIS — E039 Hypothyroidism, unspecified: Secondary | ICD-10-CM

## 2022-09-03 ENCOUNTER — Other Ambulatory Visit (HOSPITAL_COMMUNITY): Payer: Self-pay

## 2022-09-06 ENCOUNTER — Ambulatory Visit (INDEPENDENT_AMBULATORY_CARE_PROVIDER_SITE_OTHER): Payer: 59 | Admitting: Internal Medicine

## 2022-09-06 ENCOUNTER — Encounter: Payer: Self-pay | Admitting: Internal Medicine

## 2022-09-06 ENCOUNTER — Other Ambulatory Visit (HOSPITAL_COMMUNITY): Payer: Self-pay

## 2022-09-06 VITALS — BP 112/72 | HR 75 | Temp 97.6°F | Ht 65.5 in | Wt 191.1 lb

## 2022-09-06 DIAGNOSIS — I10 Essential (primary) hypertension: Secondary | ICD-10-CM

## 2022-09-06 DIAGNOSIS — Z8679 Personal history of other diseases of the circulatory system: Secondary | ICD-10-CM | POA: Diagnosis not present

## 2022-09-06 DIAGNOSIS — Z6831 Body mass index (BMI) 31.0-31.9, adult: Secondary | ICD-10-CM | POA: Diagnosis not present

## 2022-09-06 DIAGNOSIS — M7918 Myalgia, other site: Secondary | ICD-10-CM

## 2022-09-06 DIAGNOSIS — G8929 Other chronic pain: Secondary | ICD-10-CM

## 2022-09-06 DIAGNOSIS — F439 Reaction to severe stress, unspecified: Secondary | ICD-10-CM

## 2022-09-06 DIAGNOSIS — F32A Depression, unspecified: Secondary | ICD-10-CM

## 2022-09-06 DIAGNOSIS — Z0000 Encounter for general adult medical examination without abnormal findings: Secondary | ICD-10-CM | POA: Diagnosis not present

## 2022-09-06 DIAGNOSIS — R7302 Impaired glucose tolerance (oral): Secondary | ICD-10-CM

## 2022-09-06 DIAGNOSIS — M2550 Pain in unspecified joint: Secondary | ICD-10-CM

## 2022-09-06 DIAGNOSIS — E039 Hypothyroidism, unspecified: Secondary | ICD-10-CM | POA: Diagnosis not present

## 2022-09-06 DIAGNOSIS — G4733 Obstructive sleep apnea (adult) (pediatric): Secondary | ICD-10-CM | POA: Diagnosis not present

## 2022-09-06 DIAGNOSIS — F419 Anxiety disorder, unspecified: Secondary | ICD-10-CM

## 2022-09-06 DIAGNOSIS — F17200 Nicotine dependence, unspecified, uncomplicated: Secondary | ICD-10-CM

## 2022-09-06 DIAGNOSIS — I252 Old myocardial infarction: Secondary | ICD-10-CM | POA: Diagnosis not present

## 2022-09-06 DIAGNOSIS — R002 Palpitations: Secondary | ICD-10-CM

## 2022-09-06 LAB — COMPLETE METABOLIC PANEL WITH GFR
AG Ratio: 1.7 (calc) (ref 1.0–2.5)
ALT: 25 U/L (ref 6–29)
AST: 26 U/L (ref 10–35)
Albumin: 4.3 g/dL (ref 3.6–5.1)
Alkaline phosphatase (APISO): 85 U/L (ref 37–153)
BUN: 12 mg/dL (ref 7–25)
CO2: 32 mmol/L (ref 20–32)
Calcium: 9.8 mg/dL (ref 8.6–10.4)
Chloride: 99 mmol/L (ref 98–110)
Creat: 0.84 mg/dL (ref 0.50–1.03)
Globulin: 2.5 g/dL (calc) (ref 1.9–3.7)
Glucose, Bld: 109 mg/dL — ABNORMAL HIGH (ref 65–99)
Potassium: 4.9 mmol/L (ref 3.5–5.3)
Sodium: 139 mmol/L (ref 135–146)
Total Bilirubin: 0.5 mg/dL (ref 0.2–1.2)
Total Protein: 6.8 g/dL (ref 6.1–8.1)
eGFR: 81 mL/min/{1.73_m2} (ref >=60)

## 2022-09-06 LAB — CBC WITH DIFFERENTIAL/PLATELET
Absolute Monocytes: 458 cells/uL (ref 200–950)
Basophils Absolute: 47 cells/uL (ref 0–200)
Basophils Relative: 0.6 %
Eosinophils Absolute: 166 cells/uL (ref 15–500)
Eosinophils Relative: 2.1 %
HCT: 45.7 % — ABNORMAL HIGH (ref 35.0–45.0)
Hemoglobin: 15.6 g/dL — ABNORMAL HIGH (ref 11.7–15.5)
Lymphs Abs: 2354 cells/uL (ref 850–3900)
MCH: 32.4 pg (ref 27.0–33.0)
MCHC: 34.1 g/dL (ref 32.0–36.0)
MCV: 94.8 fL (ref 80.0–100.0)
MPV: 11.1 fL (ref 7.5–12.5)
Monocytes Relative: 5.8 %
Neutro Abs: 4874 cells/uL (ref 1500–7800)
Neutrophils Relative %: 61.7 %
Platelets: 203 10*3/uL (ref 140–400)
RBC: 4.82 10*6/uL (ref 3.80–5.10)
RDW: 11.9 % (ref 11.0–15.0)
Total Lymphocyte: 29.8 %
WBC: 7.9 10*3/uL (ref 3.8–10.8)

## 2022-09-06 LAB — POCT URINALYSIS DIPSTICK
Bilirubin, UA: NEGATIVE
Blood, UA: NEGATIVE
Glucose, UA: NEGATIVE
Ketones, UA: NEGATIVE
Leukocytes, UA: NEGATIVE
Nitrite, UA: NEGATIVE
Protein, UA: NEGATIVE
Spec Grav, UA: 1.01 (ref 1.010–1.025)
Urobilinogen, UA: 0.2 E.U./dL
pH, UA: 6 (ref 5.0–8.0)

## 2022-09-06 LAB — LIPID PANEL
Cholesterol: 150 mg/dL (ref <200)
HDL: 60 mg/dL (ref >=50)
LDL Cholesterol (Calc): 71 mg/dL (calc)
Non-HDL Cholesterol (Calc): 90 mg/dL (calc) (ref <130)
Total CHOL/HDL Ratio: 2.5 (calc) (ref <5.0)
Triglycerides: 105 mg/dL (ref <150)

## 2022-09-06 LAB — TSH: TSH: 1.48 mIU/L (ref 0.40–4.50)

## 2022-09-06 LAB — HEMOGLOBIN A1C W/OUT EAG

## 2022-09-06 MED ORDER — ALPRAZOLAM 0.5 MG PO TABS
0.5000 mg | ORAL_TABLET | Freq: Two times a day (BID) | ORAL | 0 refills | Status: AC | PRN
Start: 2022-09-06 — End: ?
  Filled 2022-09-06: qty 60, 30d supply, fill #0

## 2022-09-06 NOTE — Progress Notes (Unsigned)
   Subjective:    Patient ID: [name], female    DOB: [birth year], 56 y.o.   MRN: [id number]  HPI 56 year old Female seen for health maintenance exam and evaluation of medical issues.    Review of Systems     Objective:   Physical Exam        Assessment & Plan:

## 2022-09-09 ENCOUNTER — Other Ambulatory Visit: Payer: 59

## 2022-09-09 DIAGNOSIS — R7302 Impaired glucose tolerance (oral): Secondary | ICD-10-CM | POA: Diagnosis not present

## 2022-09-10 LAB — MICROALBUMIN, URINE: Microalb, Ur: 0.8 mg/dL

## 2022-09-10 LAB — HEMOGLOBIN A1C
Hgb A1c MFr Bld: 5.5 % of total Hgb (ref <5.7)
Mean Plasma Glucose: 111 mg/dL
eAG (mmol/L): 6.2 mmol/L

## 2022-09-14 ENCOUNTER — Other Ambulatory Visit: Payer: Self-pay | Admitting: Cardiovascular Disease

## 2022-09-14 ENCOUNTER — Other Ambulatory Visit: Payer: Self-pay | Admitting: Internal Medicine

## 2022-09-14 DIAGNOSIS — I10 Essential (primary) hypertension: Secondary | ICD-10-CM

## 2022-09-14 DIAGNOSIS — I2510 Atherosclerotic heart disease of native coronary artery without angina pectoris: Secondary | ICD-10-CM

## 2022-09-15 ENCOUNTER — Other Ambulatory Visit (HOSPITAL_COMMUNITY): Payer: Self-pay

## 2022-09-15 MED ORDER — LEVOTHYROXINE SODIUM 75 MCG PO TABS
75.0000 ug | ORAL_TABLET | Freq: Every day | ORAL | 1 refills | Status: DC
  Filled 2022-09-15: qty 90, 90d supply, fill #0
  Filled 2022-12-13: qty 90, 90d supply, fill #1

## 2022-09-15 MED ORDER — ATORVASTATIN CALCIUM 40 MG PO TABS
40.0000 mg | ORAL_TABLET | Freq: Every day | ORAL | 2 refills | Status: DC
  Filled 2022-09-15: qty 90, 90d supply, fill #0
  Filled 2022-12-13: qty 90, 90d supply, fill #1
  Filled 2023-03-20: qty 90, 90d supply, fill #2

## 2022-09-20 ENCOUNTER — Other Ambulatory Visit (HOSPITAL_COMMUNITY): Payer: Self-pay

## 2022-09-20 ENCOUNTER — Other Ambulatory Visit: Payer: Self-pay | Admitting: Internal Medicine

## 2022-09-20 DIAGNOSIS — Z1231 Encounter for screening mammogram for malignant neoplasm of breast: Secondary | ICD-10-CM

## 2022-09-20 DIAGNOSIS — M47817 Spondylosis without myelopathy or radiculopathy, lumbosacral region: Secondary | ICD-10-CM | POA: Diagnosis not present

## 2022-09-20 DIAGNOSIS — M2550 Pain in unspecified joint: Secondary | ICD-10-CM | POA: Diagnosis not present

## 2022-09-20 DIAGNOSIS — M542 Cervicalgia: Secondary | ICD-10-CM | POA: Diagnosis not present

## 2022-09-20 DIAGNOSIS — G894 Chronic pain syndrome: Secondary | ICD-10-CM | POA: Diagnosis not present

## 2022-09-20 MED ORDER — HYDROCODONE-ACETAMINOPHEN 5-325 MG PO TABS
1.0000 | ORAL_TABLET | Freq: Two times a day (BID) | ORAL | 0 refills | Status: DC | PRN
  Filled 2022-11-08: qty 60, 30d supply, fill #0

## 2022-09-20 MED ORDER — HYDROCODONE-ACETAMINOPHEN 5-325 MG PO TABS
1.0000 | ORAL_TABLET | Freq: Two times a day (BID) | ORAL | 0 refills | Status: DC | PRN
  Filled 2022-10-05: qty 60, 30d supply, fill #0

## 2022-09-29 NOTE — Patient Instructions (Signed)
Please follow-up with Pulmonary regarding CT scan follow-up and COPD follow-up.  Please consider vaccines including COVID booster, RSV, Shingrix.  You have received pneumococcal 20 vaccine here today.  You will receive flu vaccine through employment.  Please quit smoking.

## 2022-10-05 ENCOUNTER — Other Ambulatory Visit (HOSPITAL_COMMUNITY): Payer: Self-pay

## 2022-10-14 ENCOUNTER — Encounter: Payer: Self-pay | Admitting: Internal Medicine

## 2022-10-14 NOTE — Telephone Encounter (Signed)
Patient informed of message  

## 2022-10-19 ENCOUNTER — Other Ambulatory Visit: Payer: Self-pay | Admitting: Cardiovascular Disease

## 2022-10-19 DIAGNOSIS — I2510 Atherosclerotic heart disease of native coronary artery without angina pectoris: Secondary | ICD-10-CM

## 2022-10-19 DIAGNOSIS — I10 Essential (primary) hypertension: Secondary | ICD-10-CM

## 2022-10-20 ENCOUNTER — Other Ambulatory Visit (HOSPITAL_COMMUNITY): Payer: Self-pay

## 2022-10-20 MED ORDER — AMLODIPINE BESYLATE 5 MG PO TABS
5.0000 mg | ORAL_TABLET | Freq: Every day | ORAL | 1 refills | Status: DC
  Filled 2022-10-20: qty 90, 90d supply, fill #0
  Filled 2023-01-17: qty 90, 90d supply, fill #1

## 2022-10-22 ENCOUNTER — Other Ambulatory Visit (HOSPITAL_COMMUNITY): Payer: Self-pay

## 2022-11-08 ENCOUNTER — Encounter: Payer: Self-pay | Admitting: Pulmonary Disease

## 2022-11-08 ENCOUNTER — Other Ambulatory Visit (HOSPITAL_COMMUNITY): Payer: Self-pay

## 2022-11-08 ENCOUNTER — Ambulatory Visit: Payer: 59 | Admitting: Pulmonary Disease

## 2022-11-08 ENCOUNTER — Ambulatory Visit
Admission: RE | Admit: 2022-11-08 | Discharge: 2022-11-08 | Disposition: A | Discharge status: Home / Self Care | Payer: 59 | Source: Ambulatory Visit | Attending: Internal Medicine | Admitting: Internal Medicine

## 2022-11-08 VITALS — BP 126/74 | HR 92 | Temp 98.4°F | Ht 65.0 in | Wt 194.2 lb

## 2022-11-08 DIAGNOSIS — L570 Actinic keratosis: Secondary | ICD-10-CM | POA: Diagnosis not present

## 2022-11-08 DIAGNOSIS — D692 Other nonthrombocytopenic purpura: Secondary | ICD-10-CM | POA: Diagnosis not present

## 2022-11-08 DIAGNOSIS — Z1231 Encounter for screening mammogram for malignant neoplasm of breast: Secondary | ICD-10-CM

## 2022-11-08 DIAGNOSIS — Z720 Tobacco use: Secondary | ICD-10-CM

## 2022-11-08 DIAGNOSIS — L819 Disorder of pigmentation, unspecified: Secondary | ICD-10-CM | POA: Diagnosis not present

## 2022-11-08 DIAGNOSIS — J4530 Mild persistent asthma, uncomplicated: Secondary | ICD-10-CM

## 2022-11-08 DIAGNOSIS — R911 Solitary pulmonary nodule: Secondary | ICD-10-CM

## 2022-11-08 DIAGNOSIS — Z85828 Personal history of other malignant neoplasm of skin: Secondary | ICD-10-CM | POA: Diagnosis not present

## 2022-11-08 DIAGNOSIS — L821 Other seborrheic keratosis: Secondary | ICD-10-CM | POA: Diagnosis not present

## 2022-11-08 MED ORDER — STIOLTO RESPIMAT 2.5-2.5 MCG/ACT IN AERS
2.0000 | INHALATION_SPRAY | Freq: Every day | RESPIRATORY_TRACT | 2 refills | Status: DC
  Filled 2022-11-08: qty 4, 30d supply, fill #0
  Filled 2022-12-13: qty 4, 30d supply, fill #1
  Filled 2023-03-06: qty 4, 30d supply, fill #2

## 2022-11-08 MED ORDER — NICOTINE 7 MG/24HR TD PT24
7.0000 mg | MEDICATED_PATCH | Freq: Every day | TRANSDERMAL | 0 refills | Status: DC
  Filled 2022-11-08: qty 28, 28d supply, fill #0

## 2022-11-08 NOTE — Progress Notes (Signed)
Synopsis: Referred in Feb 2022 for abnormal ct chest by Elby Showers, MD  Subjective:   PATIENT ID: [name] GENDER: female DOB: 03-29-1966, MRN: [id number]  Chief Complaint  Patient presents with   Follow-up    March CT review, cough in am     56 year old female, past medical history of coronary vasospasm presented with a acute anterior STEMI, chronic kidney disease history of Henoch-Schnlein purpura, leukocytoclastic vasculitis in 2006, hypertension, hypothyroidism.  Presents today after a respiratory tract infection type symptoms in the first part of January, fever shortness of breath.  She did not have much cough did not have any significant sputum production.  She was treated with antibiotics.  Patient was treated with doxycycline and steroids.  She had a chest x-ray with a lower lobe opacity which led to a CT scan of the chest on 12/25/2020 which revealed a 2.9 x 2.3 nodular opacity with visible airway going through this and surrounding groundglass.  Patient has no significant respiratory symptoms at this time.  She is however a current smoker.  She has smoked for 25+ years at approximately half a pack a day, her highest at 1 pack/day.  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  OV 11/08/2022:Here today for follow-up.  Last seen in our office March 2022.  Overall she is doing well.  Unfortunately she is still smoking.  We talked about this today in the office.  Not on any maintenance inhalers of any kind.  She probably does have COPD she smoked for very long time.  She has been using her albuterol usually once a day every morning.  Her husband is trying to quit smoking so we talked a good while about smoking cessation today in the office.  She is amendable to this.  We talked about the significance of that.    Past Medical History:  Diagnosis Date   ADHD (attention deficit hyperactivity disorder)     Anxiety    Asthma    CAD (coronary artery disease)    a. 11/2012 Acute Anterior STEMI/Cath: Nonobs dzs except subtotal occlusion of small, 54m D2, EF 55-65%;  b. 11/2012  Echo: EF 60-65%, nl wall motion, Gr 2 DD.   Chronic kidney disease    in setting of HSP 2006   [place]    a. presumed   Depression    GERD (gastroesophageal reflux disease)    GI bleed    2006: secondary to leukoclastic vasculitis  per chart   [place]pura [place]    [facility] (also h/o leukocytoclastic vasculitis per chart 2006)   Hypertension    Controlled - she states she is on BP meds for HSP   Hypothyroidism    Migraines    Sleep apnea    "lost 50#; no problems w/it now" (11/22/2012)   [place]inal ileitis ([place]    2006: terminal ileitis and colitis secondary to leukoclastic vasculitis per chart   Tobacco abuse      Family History  Problem Relation Age of Onset   Stroke Father    Hypertension Mother    Heart disease Neg Hx      Past Surgical History:  Procedure Laterality Date   APPENDECTOMY  1970's   CARDIAC CATHETERIZATION  11/22/2012   "first one" (11/22/2012)   [place]  [place]   LEFT HEART CATHETERIZATION WITH CORONARY ANGIOGRAM N/A 11/22/2012   Procedure: LEFT HEART CATHETERIZATION WITH CORONARY ANGIOGRAM;  Surgeon: Sherren Mocha, MD;  Location: [facility] CATH LAB;  Service: Cardiovascular;  Laterality: N/A;    Social History   Socioeconomic History   Marital status: Married    Spouse name: Not on file   Number of children: Not on file   Years of education: Not on file   Highest education level: Not on file  Occupational History   Occupation: RN/UC    Employer: [employer]    Comment: [employer]  Tobacco Use   Smoking status: Every Day    Packs/day: 0.50    Years: 25.00    Total pack years: 12.50    Types: Cigarettes   Smokeless tobacco: Never   Tobacco comments:    smokes  1/2 pack per day ARJ 11/08/22  Vaping Use   Vaping Use: Never used  Substance and Sexual Activity   Alcohol use: No   Drug use: No   Sexual activity: Yes  Other Topics Concern   Not on file  Social History Narrative   Not on file   Social Determinants of Health   Financial Resource Strain: Not on file  Food Insecurity: Not on file  Transportation Needs: Not on file  Physical Activity: Not on file  Stress: Not on file  Social Connections: Not on file  Intimate Partner Violence: Not on file     Allergies  Allergen Reactions   Levaquin [Levofloxacin] Hives   Nsaids Other (See Comments)    Able to take aspirin "Had HSP; since then they treat my kidneys very nicely" (11/22/2012)     Outpatient Medications Prior to Visit  Medication Sig Dispense Refill   albuterol (VENTOLIN HFA) 108 (90 Base) MCG/ACT inhaler Inhale 2 puffs into the lungs every 6 (six) hours as needed. 6.7 g 11   ALPRAZolam (XANAX) 0.5 MG tablet Take 1 tablet (0.5 mg total) by mouth 2 (two) times daily as needed for anxiety. 60 tablet 0   amLODipine (NORVASC) 5 MG tablet Take 1 tablet (5 mg total) by mouth daily. 90 tablet 1   aspirin EC 81 MG EC tablet Take 1 tablet (81 mg total) by mouth daily.     atorvastatin (LIPITOR) 40 MG tablet Take 1 tablet (40 mg total) by mouth daily at 6 PM. 90 tablet 2   calcium-vitamin D (OSCAL WITH D) 500-200 MG-UNIT per tablet Take 1 tablet by mouth daily.     Cholecalciferol (VITAMIN D3) 2000 units TABS Take 2,000 Units by mouth daily.     desvenlafaxine (PRISTIQ) 50 MG 24 hr tablet Take 1 tablet (50 mg total) by mouth daily. 90 tablet 2   enalapril (VASOTEC) 10 MG tablet Take 1 tablet (10 mg total) by mouth daily. 90 tablet 2   HYDROcodone bit-homatropine (HYCODAN) 5-1.5 MG/5ML syrup Take 5 mLs by mouth every 8 (eight) hours as needed for cough. 120 mL 0   ketoconazole (NIZORAL) 2 % cream Apply 1 application topically daily. 15 g 0   levothyroxine (SYNTHROID) 75 MCG tablet Take 1  tablet (75 mcg total) by mouth daily. 90 tablet 1   loratadine (CLARITIN) 10 MG tablet Take 10 mg by mouth daily.     nitroGLYCERIN (NITROSTAT) 0.4 MG SL tablet Place 1 tablet (0.4 mg total) under the tongue every 5 (five) minutes as needed for chest pain. 25 tablet 3   pantoprazole (PROTONIX) 40 MG tablet Take  1 tablet (40 mg total) by mouth 2 (two) times daily before a meal. 180 tablet 3   tiZANidine (ZANAFLEX) 2 MG tablet Take 1 tablet (2 mg total) by mouth 3 (three) times daily as needed. 90 tablet 1   tiZANidine (ZANAFLEX) 2 MG tablet Take 1 tablet (2 mg total) by mouth 3 (three) times daily as needed. 90 tablet 1   HYDROcodone-acetaminophen (NORCO/VICODIN) 5-325 MG tablet Take 1 tablet by mouth 2 (two) times daily as needed.     HYDROcodone-acetaminophen (NORCO/VICODIN) 5-325 MG tablet Take 1 tablet by mouth 2 (two) times daily as needed for pain 60 tablet 0   HYDROcodone-acetaminophen (NORCO/VICODIN) 5-325 MG tablet Take 1 tablet by mouth 2 (two) times daily as needed for pain 60 tablet 0   HYDROcodone-acetaminophen (NORCO/VICODIN) 5-325 MG tablet Take 1 tablet by mouth 2 (two) times daily as needed for pain 60 tablet 0   HYDROcodone-acetaminophen (NORCO/VICODIN) 5-325 MG tablet Take 1 tablet by mouth 2 (two) times daily as needed for pain 60 tablet 0   HYDROcodone-acetaminophen (NORCO/VICODIN) 5-325 MG tablet Take 1 tablet by mouth twice a day as needed for pain 60 tablet 0   HYDROcodone-acetaminophen (NORCO/VICODIN) 5-325 MG tablet Take 1 tablet by mouth 2 (two) times daily as needed for pain. 60 tablet 0   HYDROcodone-acetaminophen (NORCO/VICODIN) 5-325 MG tablet Take 1 tablet by mouth 2 (two) times daily as needed. 60 tablet 0   HYDROcodone-acetaminophen (NORCO/VICODIN) 5-325 MG tablet Take 1 tablet by mouth twice a day as needed for pain 60 tablet 0   HYDROcodone-acetaminophen (NORCO/VICODIN) 5-325 MG tablet Take 1 tablet by mouth twice a day as needed for pain 60 tablet 0    HYDROcodone-acetaminophen (NORCO/VICODIN) 5-325 MG tablet Take 1 tablet by mouth twice a day as needed for pain 60 tablet 0   HYDROcodone-acetaminophen (NORCO/VICODIN) 5-325 MG tablet Take 1 tablet by mouth twice a day as needed for pain 60 tablet 0   HYDROcodone-acetaminophen (NORCO/VICODIN) 5-325 MG tablet Take 1 tablet by mouth twice a day as needed for pain 60 tablet 0   HYDROcodone-acetaminophen (NORCO/VICODIN) 5-325 MG tablet Take 1 tablet by mouth twice a day as needed for pain 60 tablet 0   HYDROcodone-acetaminophen (NORCO/VICODIN) 5-325 MG tablet Take 1 tablet by mouth twice a day as needed for pain 60 tablet 0   HYDROcodone-acetaminophen (NORCO/VICODIN) 5-325 MG tablet Take 1 tablet by mouth twice a day as needed for pain 60 tablet 0   No facility-administered medications prior to visit.    Review of Systems  Constitutional:  Negative for chills, fever, malaise/fatigue and weight loss.  HENT:  Negative for hearing loss, sore throat and tinnitus.   Eyes:  Negative for blurred vision and double vision.  Respiratory:  Positive for cough and shortness of breath. Negative for hemoptysis, sputum production, wheezing and stridor.   Cardiovascular:  Negative for chest pain, palpitations, orthopnea, leg swelling and PND.  Gastrointestinal:  Negative for abdominal pain, constipation, diarrhea, heartburn, nausea and vomiting.  Genitourinary:  Negative for dysuria, hematuria and urgency.  Musculoskeletal:  Negative for joint pain and myalgias.  Skin:  Negative for itching and rash.  Neurological:  Negative for dizziness, tingling, weakness and headaches.  Endo/Heme/Allergies:  Negative for environmental allergies. Does not bruise/bleed easily.  Psychiatric/Behavioral:  Negative for depression. The patient is not nervous/anxious and does not have insomnia.   All other systems reviewed and are negative.    Objective:  Physical Exam Vitals reviewed.  Constitutional:  General: She is not  in acute distress.    Appearance: She is well-developed.  HENT:     Head: Normocephalic and atraumatic.  Eyes:     General: No scleral icterus.    Conjunctiva/sclera: Conjunctivae normal.     Pupils: Pupils are equal, round, and reactive to light.  Neck:     Vascular: No JVD.     Trachea: No tracheal deviation.  Cardiovascular:     Rate and Rhythm: Normal rate and regular rhythm.     Heart sounds: Normal heart sounds. No murmur heard. Pulmonary:     Effort: Pulmonary effort is normal. No tachypnea, accessory muscle usage or respiratory distress.     Breath sounds: No stridor. No wheezing, rhonchi or rales.  Abdominal:     General: Bowel sounds are normal. There is no distension.     Palpations: Abdomen is soft.     Tenderness: There is no abdominal tenderness.  Musculoskeletal:        General: No tenderness.     Cervical back: Neck supple.  Lymphadenopathy:     Cervical: No cervical adenopathy.  Skin:    General: Skin is warm and dry.     Capillary Refill: Capillary refill takes less than 2 seconds.     Findings: No rash.  Neurological:     Mental Status: She is alert and oriented to person, place, and time.  Psychiatric:        Behavior: Behavior normal.      Vitals:   11/08/22 0900  BP: 126/74  Pulse: 92  Temp: 98.4 F (36.9 C)  TempSrc: Oral  SpO2: 98%  Weight: 194 lb 3.2 oz (88.1 kg)  Height: '5\' 5"'$  (1.651 m)   98% on RA BMI Readings from Last 3 Encounters:  11/08/22 32.32 kg/m  09/06/22 31.32 kg/m  06/01/22 31.70 kg/m   Wt Readings from Last 3 Encounters:  11/08/22 194 lb 3.2 oz (88.1 kg)  09/06/22 191 lb 1.9 oz (86.7 kg)  06/01/22 190 lb 8 oz (86.4 kg)     CBC    Component Value Date/Time   WBC 7.9 08/30/2022 1037   RBC 4.82 08/30/2022 1037   HGB 15.6 (H) 08/30/2022 1037   HCT 45.7 (H) 08/30/2022 1037   PLT 203 08/30/2022 1037   MCV 94.8 08/30/2022 1037   MCH 32.4 08/30/2022 1037   MCHC 34.1 08/30/2022 1037   RDW 11.9 08/30/2022 1037    LYMPHSABS 2,354 08/30/2022 1037   MONOABS 518 06/21/2017 1014   EOSABS 166 08/30/2022 1037   BASOSABS 47 08/30/2022 1037   Results for LAPAN-Brucato, Haruye ANN (MRN [id number]) as of 01/26/2021 15:15  Ref. Range 12/24/2020 12:42  Sed Rate Latest Ref Range: 0 - 30 mm/h 2  eAG (mmol/L) Latest Units: mmol/L 7.0  Glucose Latest Ref Range: 65 - 99 mg/dL 99  Hemoglobin A1C Latest Ref Range: <5.7 % of total Hgb 6.0 (H)  TSH Latest Units: mIU/L 1.68  Anti Nuclear Antibody (ANA) Latest Ref Range: NEGATIVE  NEGATIVE  Cyclic Citrullin Peptide Ab Latest Units: UNITS <16  RA Latex Turbid. Latest Ref Range: <14 IU/mL <14  Albumin MSPROF Latest Ref Range: 3.6 - 5.1 g/dL 3.8  Mycoplasma pneumo IgM Latest Ref Range: <770 U/mL 89  Legionella Antigen, Urine Latest Ref Range: Not Detect  Not Detected    Chest Imaging: 12/25/2020 CT chest: 2.9 x 2.3 cm nodular opacity left lower lobe.  This looks more inflammatory to me than malignancy.  There is a airway  visible throughout the lesion.  Scattered areas of centrilobular nodules. The patient's images have been independently reviewed by me.    March 2022 CT chest: Near resolution of the left lower lobe opacity likely inflammatory. The patient's images have been independently reviewed by me.    Pulmonary Functions Testing Results:     No data to display          FeNO:   Pathology:   Echocardiogram:   Heart Catheterization:     Assessment & Plan:     ICD-10-CM   1. Tobacco abuse  Z72.0 Ambulatory Referral for Lung Cancer Scre    2. Mild persistent asthma without complication  R44.31     3. Lung nodule  R91.1       Discussion:  This is a 56 year old female, history of Henoch-Schnlein purpura, leukocytoclastic vasculitis in 2006, current every day smoker.  She had a respiratory illness and community-acquired pneumonia had CT imaging with a nodular infiltrate.  She had follow-up subsequent CT imaging in March 2022 which showed  resolution.  She is still an ongoing smoker.  Plan: Probably has COPD. Will need PFTs at some point Start Stiolto samples with new prescription Continue albuterol as needed Ambulatory referral to lung cancer screening.    Current Outpatient Medications:    albuterol (VENTOLIN HFA) 108 (90 Base) MCG/ACT inhaler, Inhale 2 puffs into the lungs every 6 (six) hours as needed., Disp: 6.7 g, Rfl: 11   ALPRAZolam (XANAX) 0.5 MG tablet, Take 1 tablet (0.5 mg total) by mouth 2 (two) times daily as needed for anxiety., Disp: 60 tablet, Rfl: 0   amLODipine (NORVASC) 5 MG tablet, Take 1 tablet (5 mg total) by mouth daily., Disp: 90 tablet, Rfl: 1   aspirin EC 81 MG EC tablet, Take 1 tablet (81 mg total) by mouth daily., Disp: , Rfl:    atorvastatin (LIPITOR) 40 MG tablet, Take 1 tablet (40 mg total) by mouth daily at 6 PM., Disp: 90 tablet, Rfl: 2   calcium-vitamin D (OSCAL WITH D) 500-200 MG-UNIT per tablet, Take 1 tablet by mouth daily., Disp: , Rfl:    Cholecalciferol (VITAMIN D3) 2000 units TABS, Take 2,000 Units by mouth daily., Disp: , Rfl:    desvenlafaxine (PRISTIQ) 50 MG 24 hr tablet, Take 1 tablet (50 mg total) by mouth daily., Disp: 90 tablet, Rfl: 2   enalapril (VASOTEC) 10 MG tablet, Take 1 tablet (10 mg total) by mouth daily., Disp: 90 tablet, Rfl: 2   HYDROcodone bit-homatropine (HYCODAN) 5-1.5 MG/5ML syrup, Take 5 mLs by mouth every 8 (eight) hours as needed for cough., Disp: 120 mL, Rfl: 0   ketoconazole (NIZORAL) 2 % cream, Apply 1 application topically daily., Disp: 15 g, Rfl: 0   levothyroxine (SYNTHROID) 75 MCG tablet, Take 1 tablet (75 mcg total) by mouth daily., Disp: 90 tablet, Rfl: 1   loratadine (CLARITIN) 10 MG tablet, Take 10 mg by mouth daily., Disp: , Rfl:    nicotine (NICODERM CQ - DOSED IN MG/24 HR) 7 mg/24hr patch, Place 1 patch (7 mg total) onto the skin daily., Disp: 28 patch, Rfl: 0   nitroGLYCERIN (NITROSTAT) 0.4 MG SL tablet, Place 1 tablet (0.4 mg total) under the  tongue every 5 (five) minutes as needed for chest pain., Disp: 25 tablet, Rfl: 3   pantoprazole (PROTONIX) 40 MG tablet, Take 1 tablet (40 mg total) by mouth 2 (two) times daily before a meal., Disp: 180 tablet, Rfl: 3   tiZANidine (ZANAFLEX) 2 MG tablet,  Take 1 tablet (2 mg total) by mouth 3 (three) times daily as needed., Disp: 90 tablet, Rfl: 1   tiZANidine (ZANAFLEX) 2 MG tablet, Take 1 tablet (2 mg total) by mouth 3 (three) times daily as needed., Disp: 90 tablet, Rfl: 1   HYDROcodone-acetaminophen (NORCO/VICODIN) 5-325 MG tablet, Take 1 tablet by mouth 2 (two) times daily as needed., Disp: , Rfl:    HYDROcodone-acetaminophen (NORCO/VICODIN) 5-325 MG tablet, Take 1 tablet by mouth 2 (two) times daily as needed for pain, Disp: 60 tablet, Rfl: 0   HYDROcodone-acetaminophen (NORCO/VICODIN) 5-325 MG tablet, Take 1 tablet by mouth 2 (two) times daily as needed for pain, Disp: 60 tablet, Rfl: 0   HYDROcodone-acetaminophen (NORCO/VICODIN) 5-325 MG tablet, Take 1 tablet by mouth 2 (two) times daily as needed for pain, Disp: 60 tablet, Rfl: 0   HYDROcodone-acetaminophen (NORCO/VICODIN) 5-325 MG tablet, Take 1 tablet by mouth 2 (two) times daily as needed for pain, Disp: 60 tablet, Rfl: 0   HYDROcodone-acetaminophen (NORCO/VICODIN) 5-325 MG tablet, Take 1 tablet by mouth twice a day as needed for pain, Disp: 60 tablet, Rfl: 0   HYDROcodone-acetaminophen (NORCO/VICODIN) 5-325 MG tablet, Take 1 tablet by mouth 2 (two) times daily as needed for pain., Disp: 60 tablet, Rfl: 0   HYDROcodone-acetaminophen (NORCO/VICODIN) 5-325 MG tablet, Take 1 tablet by mouth 2 (two) times daily as needed., Disp: 60 tablet, Rfl: 0   HYDROcodone-acetaminophen (NORCO/VICODIN) 5-325 MG tablet, Take 1 tablet by mouth twice a day as needed for pain, Disp: 60 tablet, Rfl: 0   HYDROcodone-acetaminophen (NORCO/VICODIN) 5-325 MG tablet, Take 1 tablet by mouth twice a day as needed for pain, Disp: 60 tablet, Rfl: 0    HYDROcodone-acetaminophen (NORCO/VICODIN) 5-325 MG tablet, Take 1 tablet by mouth twice a day as needed for pain, Disp: 60 tablet, Rfl: 0   HYDROcodone-acetaminophen (NORCO/VICODIN) 5-325 MG tablet, Take 1 tablet by mouth twice a day as needed for pain, Disp: 60 tablet, Rfl: 0   HYDROcodone-acetaminophen (NORCO/VICODIN) 5-325 MG tablet, Take 1 tablet by mouth twice a day as needed for pain, Disp: 60 tablet, Rfl: 0   HYDROcodone-acetaminophen (NORCO/VICODIN) 5-325 MG tablet, Take 1 tablet by mouth twice a day as needed for pain, Disp: 60 tablet, Rfl: 0   HYDROcodone-acetaminophen (NORCO/VICODIN) 5-325 MG tablet, Take 1 tablet by mouth twice a day as needed for pain, Disp: 60 tablet, Rfl: 0   HYDROcodone-acetaminophen (NORCO/VICODIN) 5-325 MG tablet, Take 1 tablet by mouth twice a day as needed for pain, Disp: 60 tablet, Rfl: 0   Garner Nash, DO [facility] 11/08/2022 9:17 AM

## 2022-11-08 NOTE — Patient Instructions (Addendum)
Thank you for visiting Dr. Valeta Harms at [facility]. Today we recommend the following:  Stiolto samples, and new prescription  Continue albuterol as needed  Referral to LDCT program  Nicotine patches and gum   Return in about 3 months (around 02/07/2023) for with Eric Form, NP, or Dr. Valeta Harms.    Please do your part to reduce the spread of COVID-19.

## 2022-11-08 NOTE — Addendum Note (Signed)
Addended by: Gavin Potters R on: 11/08/2022 09:22 AM   Modules accepted: Orders

## 2022-11-15 ENCOUNTER — Other Ambulatory Visit (HOSPITAL_COMMUNITY): Payer: Self-pay

## 2022-11-15 DIAGNOSIS — M2550 Pain in unspecified joint: Secondary | ICD-10-CM | POA: Diagnosis not present

## 2022-11-15 DIAGNOSIS — M47817 Spondylosis without myelopathy or radiculopathy, lumbosacral region: Secondary | ICD-10-CM | POA: Diagnosis not present

## 2022-11-15 DIAGNOSIS — M542 Cervicalgia: Secondary | ICD-10-CM | POA: Diagnosis not present

## 2022-11-15 DIAGNOSIS — G894 Chronic pain syndrome: Secondary | ICD-10-CM | POA: Diagnosis not present

## 2022-11-15 MED ORDER — HYDROCODONE-ACETAMINOPHEN 5-325 MG PO TABS: 1.0000 | ORAL_TABLET | Freq: Two times a day (BID) | ORAL | 0 refills | Status: DC | PRN

## 2022-11-15 MED ORDER — HYDROCODONE-ACETAMINOPHEN 5-325 MG PO TABS
1.0000 | ORAL_TABLET | Freq: Two times a day (BID) | ORAL | 0 refills | Status: DC | PRN
  Filled 2022-12-08: qty 60, 30d supply, fill #0

## 2022-11-24 ENCOUNTER — Other Ambulatory Visit: Payer: Self-pay | Admitting: Internal Medicine

## 2022-11-24 ENCOUNTER — Other Ambulatory Visit (HOSPITAL_COMMUNITY): Payer: Self-pay

## 2022-11-24 DIAGNOSIS — I2510 Atherosclerotic heart disease of native coronary artery without angina pectoris: Secondary | ICD-10-CM

## 2022-11-24 DIAGNOSIS — I10 Essential (primary) hypertension: Secondary | ICD-10-CM

## 2022-11-24 MED ORDER — ENALAPRIL MALEATE 10 MG PO TABS
10.0000 mg | ORAL_TABLET | Freq: Every day | ORAL | 2 refills | Status: DC
  Filled 2022-11-24: qty 90, 90d supply, fill #0
  Filled 2023-02-22: qty 90, 90d supply, fill #1
  Filled 2023-05-24: qty 90, 90d supply, fill #2

## 2022-12-08 ENCOUNTER — Other Ambulatory Visit (HOSPITAL_COMMUNITY): Payer: Self-pay

## 2023-01-04 ENCOUNTER — Encounter: Payer: Self-pay | Admitting: Pulmonary Disease

## 2023-01-05 NOTE — Telephone Encounter (Signed)
Mychart message sent by pt: [name]  [facility] (supporting Garner Nash, DO)17 hours ago (4:15 PM)    I haven't heard from anyone in regards to LDCT program.  didn't know if I was supposed to do something or how long to wait to hear from someone.     Thank you,   [employer] to both Judson Roch and lung nodule pool.

## 2023-01-07 ENCOUNTER — Other Ambulatory Visit (HOSPITAL_COMMUNITY): Payer: Self-pay

## 2023-01-10 ENCOUNTER — Other Ambulatory Visit (HOSPITAL_COMMUNITY): Payer: Self-pay

## 2023-01-10 DIAGNOSIS — M2550 Pain in unspecified joint: Secondary | ICD-10-CM | POA: Diagnosis not present

## 2023-01-10 DIAGNOSIS — M47817 Spondylosis without myelopathy or radiculopathy, lumbosacral region: Secondary | ICD-10-CM | POA: Diagnosis not present

## 2023-01-10 DIAGNOSIS — G894 Chronic pain syndrome: Secondary | ICD-10-CM | POA: Diagnosis not present

## 2023-01-10 DIAGNOSIS — M542 Cervicalgia: Secondary | ICD-10-CM | POA: Diagnosis not present

## 2023-01-10 MED ORDER — HYDROCODONE-ACETAMINOPHEN 5-325 MG PO TABS: 1.0000 | ORAL_TABLET | Freq: Two times a day (BID) | ORAL | 0 refills | Status: DC | PRN

## 2023-01-10 MED ORDER — HYDROCODONE-ACETAMINOPHEN 5-325 MG PO TABS
1.0000 | ORAL_TABLET | Freq: Two times a day (BID) | ORAL | 0 refills | Status: DC | PRN
  Filled 2023-02-08: qty 60, 30d supply, fill #0

## 2023-01-10 MED ORDER — NALOXONE HCL 4 MG/0.1ML NA LIQD
NASAL | 1 refills | Status: AC
  Filled 2023-01-10: qty 2, 30d supply, fill #0

## 2023-01-17 ENCOUNTER — Other Ambulatory Visit (HOSPITAL_COMMUNITY): Payer: Self-pay

## 2023-01-24 ENCOUNTER — Other Ambulatory Visit: Payer: Self-pay | Admitting: *Deleted

## 2023-01-24 DIAGNOSIS — F17210 Nicotine dependence, cigarettes, uncomplicated: Secondary | ICD-10-CM

## 2023-01-24 DIAGNOSIS — Z87891 Personal history of nicotine dependence: Secondary | ICD-10-CM

## 2023-01-24 DIAGNOSIS — Z122 Encounter for screening for malignant neoplasm of respiratory organs: Secondary | ICD-10-CM

## 2023-01-30 ENCOUNTER — Encounter: Payer: Self-pay | Admitting: Physician Assistant

## 2023-01-31 ENCOUNTER — Ambulatory Visit (INDEPENDENT_AMBULATORY_CARE_PROVIDER_SITE_OTHER): Payer: Commercial Managed Care - PPO | Admitting: Physician Assistant

## 2023-01-31 ENCOUNTER — Encounter: Payer: Self-pay | Admitting: Physician Assistant

## 2023-01-31 DIAGNOSIS — F17210 Nicotine dependence, cigarettes, uncomplicated: Secondary | ICD-10-CM

## 2023-01-31 NOTE — Progress Notes (Signed)
Virtual Visit via Telephone Note  I connected with [name] on 01/31/23 at 11:30 AM EST by telephone and verified that I am speaking with the correct person using two identifiers.  Location: Patient: home Provider: working virtually from home   I discussed the limitations, risks, security and privacy concerns of performing an evaluation and management service by telephone and the availability of in person appointments. I also discussed with the patient that there may be a patient responsible charge related to this service. The patient expressed understanding and agreed to proceed.     Shared Decision Making Visit Lung Cancer Screening Program [phone])   Eligibility: Age 57 y.o. Pack Years Smoking History Calculation 20 (# packs/per year x # years smoked) Recent History of coughing up blood  no Unexplained weight loss? no ( >Than 15 pounds within the last 6 months ) Prior History Lung / other cancer no (Diagnosis within the last 5 years already requiring surveillance chest CT Scans). Smoking Status Current Smoker Former Smokers: Years since quit: n/a  Quit Date: n/a  Visit Components: Discussion included one or more decision making aids. yes Discussion included risk/benefits of screening. yes Discussion included potential follow up diagnostic testing for abnormal scans. yes Discussion included meaning and risk of over diagnosis. yes Discussion included meaning and risk of False Positives. yes Discussion included meaning of total radiation exposure. yes  Counseling Included: Importance of adherence to annual lung cancer LDCT screening. yes Impact of comorbidities on ability to participate in the program. yes Ability and willingness to under diagnostic treatment. yes  Smoking Cessation Counseling: Current Smokers:  Discussed importance of smoking cessation. yes Information about tobacco cessation classes and interventions provided to patient. yes Patient  provided with "ticket" for LDCT Scan. N/a Symptomatic Patient. no  Counseling(Intermediate counseling: > three minutes) 99406 Diagnosis Code: Tobacco Use Z72.0 Asymptomatic Patient yes  Counseling (Intermediate counseling: > three minutes counseling) [id number] Written Order for Lung Cancer Screening with LDCT placed in Epic. Yes (CT Chest Lung Cancer Screening Low Dose W/O CM) [id number] Z12.2-Screening of respiratory organs Z87.891-Personal history of nicotine dependence   I have spent 25 minutes of face to face/ virtual visit   time with patient discussing the risks and benefits of lung cancer screening. We viewed / discussed a power point together that explained in detail the above noted topics. We paused at intervals to allow for questions to be asked and answered to ensure understanding.We discussed that the single most powerful action that she can take to decrease her risk of developing lung cancer is to quit smoking. We discussed whether or not she is ready to commit to setting a quit date. We discussed options for tools to aid in quitting smoking including nicotine replacement therapy, non-nicotine medications, support groups, Quit Smart classes, and behavior modification. We discussed that often times setting smaller, more achievable goals, such as eliminating 1 cigarette a day for a week and then 2 cigarettes a day for a week can be helpful in slowly decreasing the number of cigarettes smoked. This allows for a sense of accomplishment as well as providing a clinical benefit. I provided  her  with smoking cessation  information  with contact information for community resources, classes, free nicotine replacement therapy, and access to mobile apps, text messaging, and on-line smoking cessation help. I have also provided  her  the office contact information in the event she needs to contact me, or the screening staff. We discussed the time and location of  the scan, and that either Doroteo Glassman RN,  Joella Prince, RN  or I will call / send a letter with the results within 24-72 hours of receiving them. The patient verbalized understanding of all of  the above and had no further questions upon leaving the office. They have my contact information in the event they have any further questions.  I spent three minutes counseling on smoking cessation and the health risks of continued tobacco abuse.  I explained to the patient that there has been a high incidence of coronary artery disease noted on these exams. I explained that this is a non-gated exam therefore degree or severity cannot be determined. This patient is on statin therapy. I have asked the patient to follow-up with their PCP regarding any incidental finding of coronary artery disease and management with diet or medication as their PCP  feels is clinically indicated. The patient verbalized understanding of the above and had no further questions upon completion of the visit.    Otilio Carpen Suleima Ohlendorf, PA-C

## 2023-01-31 NOTE — Patient Instructions (Signed)
Thank you for participating in the [facility]. It was our pleasure to meet you today. We will call you with the results of your scan within the next few days. Your scan will be assigned a Lung RADS category score by the physicians reading the scans.  This Lung RADS score determines follow up scanning.  See below for description of categories, and follow up screening recommendations. We will be in touch to schedule your follow up screening annually or based on recommendations of our providers. We will fax a copy of your scan results to your Primary Care Physician, or the physician who referred you to the program, to ensure they have the results. Please call the office if you have any questions or concerns regarding your scanning experience or results.  Our office number is [phone]. Please speak with Denise Phelps, RN. , or  Denise Buckner RN, They are  our Lung Cancer Screening RN.'s If They are unavailable when you call, Please leave a message on the voice mail. We will return your call at our earliest convenience.This voice mail is monitored several times a day.  Remember, if your scan is normal, we will scan you annually as long as you continue to meet the criteria for the program. (Age 50-80, Current smoker or smoker who has quit within the last 15 years). If you are a smoker, remember, quitting is the single most powerful action that you can take to decrease your risk of lung cancer and other pulmonary, breathing related problems. We know quitting is hard, and we are here to help.  Please let us know if there is anything we can do to help you meet your goal of quitting. If you are a former smoker, congratulations. We are proud of you! Remain smoke free! Remember you can refer friends or family members through the number above.  We will screen them to make sure they meet criteria for the program. Thank you for helping us take better care of you by  participating in Lung Screening.  You can receive free nicotine replacement therapy ( patches, gum or mints) by calling 1-800-QUIT NOW. Please call so we can get you on the path to becoming  a non-smoker. I know it is hard, but you can do this!  Lung RADS Categories:  Lung RADS 1: no nodules or definitely non-concerning nodules.  Recommendation is for a repeat annual scan in 12 months.  Lung RADS 2:  nodules that are non-concerning in appearance and behavior with a very low likelihood of becoming an active cancer. Recommendation is for a repeat annual scan in 12 months.  Lung RADS 3: nodules that are probably non-concerning , includes nodules with a low likelihood of becoming an active cancer.  Recommendation is for a 6-month repeat screening scan. Often noted after an upper respiratory illness. We will be in touch to make sure you have no questions, and to schedule your 6-month scan.  Lung RADS 4 A: nodules with concerning findings, recommendation is most often for a follow up scan in 3 months or additional testing based on our provider's assessment of the scan. We will be in touch to make sure you have no questions and to schedule the recommended 3 month follow up scan.  Lung RADS 4 B:  indicates findings that are concerning. We will be in touch with you to schedule additional diagnostic testing based on our provider's  assessment of the scan.  Other options for assistance in smoking cessation (   As covered by your insurance benefits)  Hypnosis for smoking cessation  [employer] [phone]  Acupuncture for smoking cessation  [facility] [phone]   

## 2023-02-01 ENCOUNTER — Ambulatory Visit (HOSPITAL_BASED_OUTPATIENT_CLINIC_OR_DEPARTMENT_OTHER)
Admission: RE | Admit: 2023-02-01 | Discharge: 2023-02-01 | Disposition: A | Discharge status: Home / Self Care | Payer: Commercial Managed Care - PPO | Source: Ambulatory Visit | Attending: Acute Care | Admitting: Acute Care

## 2023-02-01 DIAGNOSIS — Z122 Encounter for screening for malignant neoplasm of respiratory organs: Secondary | ICD-10-CM | POA: Diagnosis not present

## 2023-02-01 DIAGNOSIS — Z87891 Personal history of nicotine dependence: Secondary | ICD-10-CM | POA: Diagnosis not present

## 2023-02-01 DIAGNOSIS — F17210 Nicotine dependence, cigarettes, uncomplicated: Secondary | ICD-10-CM | POA: Diagnosis not present

## 2023-02-02 ENCOUNTER — Other Ambulatory Visit: Payer: Self-pay | Admitting: Acute Care

## 2023-02-02 DIAGNOSIS — Z87891 Personal history of nicotine dependence: Secondary | ICD-10-CM

## 2023-02-02 DIAGNOSIS — Z122 Encounter for screening for malignant neoplasm of respiratory organs: Secondary | ICD-10-CM

## 2023-02-02 DIAGNOSIS — F17210 Nicotine dependence, cigarettes, uncomplicated: Secondary | ICD-10-CM

## 2023-02-08 ENCOUNTER — Other Ambulatory Visit (HOSPITAL_COMMUNITY): Payer: Self-pay

## 2023-02-09 DIAGNOSIS — G43109 Migraine with aura, not intractable, without status migrainosus: Secondary | ICD-10-CM | POA: Diagnosis not present

## 2023-02-09 DIAGNOSIS — H04123 Dry eye syndrome of bilateral lacrimal glands: Secondary | ICD-10-CM | POA: Diagnosis not present

## 2023-02-09 DIAGNOSIS — H43811 Vitreous degeneration, right eye: Secondary | ICD-10-CM | POA: Diagnosis not present

## 2023-02-09 DIAGNOSIS — H2513 Age-related nuclear cataract, bilateral: Secondary | ICD-10-CM | POA: Diagnosis not present

## 2023-02-09 DIAGNOSIS — H40013 Open angle with borderline findings, low risk, bilateral: Secondary | ICD-10-CM | POA: Diagnosis not present

## 2023-02-23 ENCOUNTER — Other Ambulatory Visit (HOSPITAL_COMMUNITY): Payer: Self-pay

## 2023-02-28 ENCOUNTER — Ambulatory Visit: Payer: Commercial Managed Care - PPO | Admitting: Pulmonary Disease

## 2023-02-28 ENCOUNTER — Encounter: Payer: Self-pay | Admitting: Pulmonary Disease

## 2023-02-28 VITALS — BP 120/80 | HR 92 | Ht 65.0 in | Wt 192.2 lb

## 2023-02-28 DIAGNOSIS — J4530 Mild persistent asthma, uncomplicated: Secondary | ICD-10-CM

## 2023-02-28 DIAGNOSIS — F17210 Nicotine dependence, cigarettes, uncomplicated: Secondary | ICD-10-CM | POA: Diagnosis not present

## 2023-02-28 DIAGNOSIS — F17200 Nicotine dependence, unspecified, uncomplicated: Secondary | ICD-10-CM

## 2023-02-28 DIAGNOSIS — Z122 Encounter for screening for malignant neoplasm of respiratory organs: Secondary | ICD-10-CM

## 2023-02-28 DIAGNOSIS — Z6831 Body mass index (BMI) 31.0-31.9, adult: Secondary | ICD-10-CM | POA: Diagnosis not present

## 2023-02-28 DIAGNOSIS — Z01419 Encounter for gynecological examination (general) (routine) without abnormal findings: Secondary | ICD-10-CM | POA: Diagnosis not present

## 2023-02-28 NOTE — Patient Instructions (Signed)
Thank you for visiting Dr. Valeta Harms at [facility]. Today we recommend the following:  Orders Placed This Encounter  Procedures   Pulmonary Function Test   PFT prior to next appt.   Return in about 6 months (around 08/31/2023) for with Eric Form, NP, or Dr. Valeta Harms, after PFTs.    Please do your part to reduce the spread of COVID-19.

## 2023-02-28 NOTE — Progress Notes (Signed)
Synopsis: Referred in Feb 2022 for abnormal ct chest by Elby Showers, MD  Subjective:   PATIENT ID: [name] GENDER: female DOB: 05/08/1966, MRN: [id number]  Chief Complaint  Patient presents with   Follow-up    F/up CT    57 year old female, past medical history of coronary vasospasm presented with a acute anterior STEMI, chronic kidney disease history of Henoch-Schnlein purpura, leukocytoclastic vasculitis in 2006, hypertension, hypothyroidism.  Presents today after a respiratory tract infection type symptoms in the first part of January, fever shortness of breath.  She did not have much cough did not have any significant sputum production.  She was treated with antibiotics.  Patient was treated with doxycycline and steroids.  She had a chest x-ray with a lower lobe opacity which led to a CT scan of the chest on 12/25/2020 which revealed a 2.9 x 2.3 nodular opacity with visible airway going through this and surrounding groundglass.  Patient has no significant respiratory symptoms at this time.  She is however a current smoker.  She has smoked for 25+ years at approximately half a pack a day, her highest at 1 pack/day.  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  OV 11/08/2022:Here today for follow-up.  Last seen in our office March 2022.  Overall she is doing well.  Unfortunately she is still smoking.  We talked about this today in the office.  Not on any maintenance inhalers of any kind.  She probably does have COPD she smoked for very long time.  She has been using her albuterol usually once a day every morning.  Her husband is trying to quit smoking so we talked a good while about smoking cessation today in the office.  She is amendable to this.  We talked about the significance of that.  OV 02/28/2023: Here today for follow-up.  Lung cancer screening CT read as lung RADS 1.  She unfortunately is still  smoking.  She is working on trying to quit.  She is using her Stiolto.    Past Medical History:  Diagnosis Date   ADHD (attention deficit hyperactivity disorder)    Anxiety    Asthma    CAD (coronary artery disease)    a. 11/2012 Acute Anterior STEMI/Cath: Nonobs dzs except subtotal occlusion of small, 39mm D2, EF 55-65%;  b. 11/2012  Echo: EF 60-65%, nl wall motion, Gr 2 DD.   Chronic kidney disease    in setting of HSP 2006   Coronary vasospasm ([place])    a. presumed   Depression    GERD (gastroesophageal reflux disease)    GI bleed    2006: secondary to leukoclastic vasculitis  per chart   Henoch-Schonlein purpura ([place])    HSP tx at [facility] in 2006 (also h/o leukocytoclastic vasculitis per chart 2006)   Hypertension    Controlled - she states she is on BP meds for HSP   Hypothyroidism    Migraines    Sleep apnea    "lost 50#; no problems w/it now" (11/22/2012)   Terminal ileitis ([place])    2006: terminal ileitis and colitis secondary to leukoclastic vasculitis per chart   Tobacco abuse      Family History  Problem Relation Age of Onset   Stroke Father    Hypertension Mother    Heart disease Neg Hx      Past Surgical History:  Procedure Laterality  Date   APPENDECTOMY  1970's   CARDIAC CATHETERIZATION  11/22/2012   "first one" (11/22/2012)   [place]; 1996; [place]  1990's   LEFT HEART CATHETERIZATION WITH CORONARY ANGIOGRAM N/A 11/22/2012   Procedure: LEFT HEART CATHETERIZATION WITH CORONARY ANGIOGRAM;  Surgeon: Sherren Mocha, MD;  Location: [facility] CATH LAB;  Service: Cardiovascular;  Laterality: N/A;    Social History   Socioeconomic History   Marital status: Married    Spouse name: Not on file   Number of children: Not on file   Years of education: Not on file   Highest education level: Not on file  Occupational History   Occupation: RN/UC    Employer: [employer]    Comment: [employer]  Tobacco Use    Smoking status: Every Day    Packs/day: 0.50    Years: 35.00    Additional pack years: 0.00    Total pack years: 17.50    Types: Cigarettes   Smokeless tobacco: Never   Tobacco comments:    Smokes 2 1/2 pack of cigarettes week. 02/28/23 [place]  Vaping Use   Vaping Use: Never used  Substance and Sexual Activity   Alcohol use: No   Drug use: No   Sexual activity: Yes  Other Topics Concern   Not on file  Social History Narrative   Not on file   Social Determinants of Health   Financial Resource Strain: Not on file  Food Insecurity: Not on file  Transportation Needs: Not on file  Physical Activity: Not on file  Stress: Not on file  Social Connections: Not on file  Intimate Partner Violence: Not on file     Allergies  Allergen Reactions   Levaquin [Levofloxacin] Hives   Nsaids Other (See Comments)    Able to take aspirin "Had HSP; since then they treat my kidneys very nicely" (11/22/2012)     Outpatient Medications Prior to Visit  Medication Sig Dispense Refill   albuterol (VENTOLIN HFA) 108 (90 Base) MCG/ACT inhaler Inhale 2 puffs into the lungs every 6 (six) hours as needed. 6.7 g 11   ALPRAZolam (XANAX) 0.5 MG tablet Take 1 tablet (0.5 mg total) by mouth 2 (two) times daily as needed for anxiety. 60 tablet 0   amLODipine (NORVASC) 5 MG tablet Take 1 tablet (5 mg total) by mouth daily. 90 tablet 1   aspirin EC 81 MG EC tablet Take 1 tablet (81 mg total) by mouth daily.     atorvastatin (LIPITOR) 40 MG tablet Take 1 tablet (40 mg total) by mouth daily at 6 PM. 90 tablet 2   calcium-vitamin D (OSCAL WITH D) 500-200 MG-UNIT per tablet Take 1 tablet by mouth daily.     Cholecalciferol (VITAMIN D3) 2000 units TABS Take 2,000 Units by mouth daily.     desvenlafaxine (PRISTIQ) 50 MG 24 hr tablet Take 1 tablet (50 mg total) by mouth daily. 90 tablet 2   enalapril (VASOTEC) 10 MG tablet Take 1 tablet (10 mg total) by mouth daily. 90 tablet 2   HYDROcodone bit-homatropine (HYCODAN)  5-1.5 MG/5ML syrup Take 5 mLs by mouth every 8 (eight) hours as needed for cough. 120 mL 0   HYDROcodone-acetaminophen (NORCO/VICODIN) 5-325 MG tablet Take 1 tablet by mouth 2 (two) times daily as needed.     HYDROcodone-acetaminophen (NORCO/VICODIN) 5-325 MG tablet Take 1 tablet by mouth 2 (two) times daily as needed for pain 60 tablet 0  HYDROcodone-acetaminophen (NORCO/VICODIN) 5-325 MG tablet Take 1 tablet by mouth 2 (two) times daily as needed for pain 60 tablet 0   HYDROcodone-acetaminophen (NORCO/VICODIN) 5-325 MG tablet Take 1 tablet by mouth 2 (two) times daily as needed for pain 60 tablet 0   HYDROcodone-acetaminophen (NORCO/VICODIN) 5-325 MG tablet Take 1 tablet by mouth 2 (two) times daily as needed for pain 60 tablet 0   HYDROcodone-acetaminophen (NORCO/VICODIN) 5-325 MG tablet Take 1 tablet by mouth twice a day as needed for pain 60 tablet 0   HYDROcodone-acetaminophen (NORCO/VICODIN) 5-325 MG tablet Take 1 tablet by mouth 2 (two) times daily as needed for pain. 60 tablet 0   HYDROcodone-acetaminophen (NORCO/VICODIN) 5-325 MG tablet Take 1 tablet by mouth 2 (two) times daily as needed. 60 tablet 0   HYDROcodone-acetaminophen (NORCO/VICODIN) 5-325 MG tablet Take 1 tablet by mouth twice a day as needed for pain 60 tablet 0   HYDROcodone-acetaminophen (NORCO/VICODIN) 5-325 MG tablet Take 1 tablet by mouth twice a day as needed for pain 60 tablet 0   HYDROcodone-acetaminophen (NORCO/VICODIN) 5-325 MG tablet Take 1 tablet by mouth twice a day as needed for pain 60 tablet 0   HYDROcodone-acetaminophen (NORCO/VICODIN) 5-325 MG tablet Take 1 tablet by mouth twice a day as needed for pain 60 tablet 0   HYDROcodone-acetaminophen (NORCO/VICODIN) 5-325 MG tablet Take 1 tablet by mouth twice a day as needed for pain 60 tablet 0   HYDROcodone-acetaminophen (NORCO/VICODIN) 5-325 MG tablet Take 1 tablet by mouth 2 (two) times daily as needed for pain. 60 tablet 0   HYDROcodone-acetaminophen  (NORCO/VICODIN) 5-325 MG tablet Take 1 tablet by mouth twice a day as needed for pain 60 tablet 0   HYDROcodone-acetaminophen (NORCO/VICODIN) 5-325 MG tablet Take 1 tablet by mouth twice a day as needed for pain 60 tablet 0   HYDROcodone-acetaminophen (NORCO/VICODIN) 5-325 MG tablet Take 1 tablet by mouth twice a day as needed for pain dnfb 12/14/22 60 tablet 0   HYDROcodone-acetaminophen (NORCO/VICODIN) 5-325 MG tablet Take 1 tablet by mouth twice a day as needed for pain 60 tablet 0   HYDROcodone-acetaminophen (NORCO/VICODIN) 5-325 MG tablet Take 1 tablet by mouth twice a day as needed for pain 60 tablet 0   HYDROcodone-acetaminophen (NORCO/VICODIN) 5-325 MG tablet Take 1 tablet by mouth twice a day as needed for pain 60 tablet 0   ketoconazole (NIZORAL) 2 % cream Apply 1 application topically daily. 15 g 0   levothyroxine (SYNTHROID) 75 MCG tablet Take 1 tablet (75 mcg total) by mouth daily. 90 tablet 1   loratadine (CLARITIN) 10 MG tablet Take 10 mg by mouth daily.     naloxone (NARCAN) nasal spray 4 mg/0.1 mL 1 spray into one nostril as a single dose as needed may repeat dose every 2-3 min until patient responsive or EMS arrives 2 each 1   nicotine (NICODERM CQ - DOSED IN MG/24 HR) 7 mg/24hr patch Place 1 patch (7 mg total) onto the skin daily. 28 patch 0   nitroGLYCERIN (NITROSTAT) 0.4 MG SL tablet Place 1 tablet (0.4 mg total) under the tongue every 5 (five) minutes as needed for chest pain. 25 tablet 3   pantoprazole (PROTONIX) 40 MG tablet Take 1 tablet (40 mg total) by mouth 2 (two) times daily before a meal. 180 tablet 3   Tiotropium Bromide-Olodaterol (STIOLTO RESPIMAT) 2.5-2.5 MCG/ACT AERS Inhale 2 puffs into the lungs daily. 4 g 2   tiZANidine (ZANAFLEX) 2 MG tablet Take 1 tablet (2 mg total) by mouth 3 (  three) times daily as needed. 90 tablet 1   tiZANidine (ZANAFLEX) 2 MG tablet Take 1 tablet (2 mg total) by mouth 3 (three) times daily as needed. 90 tablet 1   No facility-administered  medications prior to visit.    Review of Systems  Constitutional:  Negative for chills, fever, malaise/fatigue and weight loss.  HENT:  Negative for hearing loss, sore throat and tinnitus.   Eyes:  Negative for blurred vision and double vision.  Respiratory:  Positive for cough and sputum production. Negative for hemoptysis, shortness of breath, wheezing and stridor.   Cardiovascular:  Negative for chest pain, palpitations, orthopnea, leg swelling and PND.  Gastrointestinal:  Negative for abdominal pain, constipation, diarrhea, heartburn, nausea and vomiting.  Genitourinary:  Negative for dysuria, hematuria and urgency.  Musculoskeletal:  Negative for joint pain and myalgias.  Skin:  Negative for itching and rash.  Neurological:  Negative for dizziness, tingling, weakness and headaches.  Endo/Heme/Allergies:  Negative for environmental allergies. Does not bruise/bleed easily.  Psychiatric/Behavioral:  Negative for depression. The patient is not nervous/anxious and does not have insomnia.   All other systems reviewed and are negative.    Objective:  Physical Exam Vitals reviewed.  Constitutional:      General: She is not in acute distress.    Appearance: She is well-developed.  HENT:     Head: Normocephalic and atraumatic.  Eyes:     General: No scleral icterus.    Conjunctiva/sclera: Conjunctivae normal.     Pupils: Pupils are equal, round, and reactive to light.  Neck:     Vascular: No JVD.     Trachea: No tracheal deviation.  Cardiovascular:     Rate and Rhythm: Normal rate and regular rhythm.     Heart sounds: Normal heart sounds. No murmur heard. Pulmonary:     Effort: Pulmonary effort is normal. No tachypnea, accessory muscle usage or respiratory distress.     Breath sounds: No stridor. No wheezing, rhonchi or rales.  Abdominal:     General: There is no distension.     Palpations: Abdomen is soft.     Tenderness: There is no abdominal tenderness.  Musculoskeletal:         General: No tenderness.     Cervical back: Neck supple.  Lymphadenopathy:     Cervical: No cervical adenopathy.  Skin:    General: Skin is warm and dry.     Capillary Refill: Capillary refill takes less than 2 seconds.     Findings: No rash.  Neurological:     Mental Status: She is alert and oriented to person, place, and time.  Psychiatric:        Behavior: Behavior normal.      Vitals:   02/28/23 0859  BP: 120/80  Pulse: 92  SpO2: 98%  Weight: 192 lb 3.2 oz (87.2 kg)  Height: 5\' 5"  (1.651 m)   98% on RA BMI Readings from Last 3 Encounters:  02/28/23 31.98 kg/m  02/01/23 32.45 kg/m  11/08/22 32.32 kg/m   Wt Readings from Last 3 Encounters:  02/28/23 192 lb 3.2 oz (87.2 kg)  02/01/23 195 lb (88.5 kg)  11/08/22 194 lb 3.2 oz (88.1 kg)     CBC    Component Value Date/Time   WBC 7.9 08/30/2022 1037   RBC 4.82 08/30/2022 1037   HGB 15.6 (H) 08/30/2022 1037   HCT 45.7 (H) 08/30/2022 1037   PLT 203 08/30/2022 1037   MCV 94.8 08/30/2022 1037   MCH 32.4  08/30/2022 1037   MCHC 34.1 08/30/2022 1037   RDW 11.9 08/30/2022 1037   LYMPHSABS 2,354 08/30/2022 1037   MONOABS 518 06/21/2017 1014   EOSABS 166 08/30/2022 1037   BASOSABS 47 08/30/2022 1037   Results for LAPAN-Maka, [facility] ANN (MRN [id number]) as of 01/26/2021 15:15  Ref. Range 12/24/2020 12:42  Sed Rate Latest Ref Range: 0 - 30 mm/h 2  eAG (mmol/L) Latest Units: mmol/L 7.0  Glucose Latest Ref Range: 65 - 99 mg/dL 99  Hemoglobin A1C Latest Ref Range: <5.7 % of total Hgb 6.0 (H)  TSH Latest Units: mIU/L 1.68  Anti Nuclear Antibody (ANA) Latest Ref Range: NEGATIVE  NEGATIVE  Cyclic Citrullin Peptide Ab Latest Units: UNITS <16  RA Latex Turbid. Latest Ref Range: <14 IU/mL <14  Albumin MSPROF Latest Ref Range: 3.6 - 5.1 g/dL 3.8  Mycoplasma pneumo IgM Latest Ref Range: <770 U/mL 89  Legionella Antigen, Urine Latest Ref Range: Not Detect  Not Detected    Chest Imaging: 12/25/2020 CT chest: 2.9 x  2.3 cm nodular opacity left lower lobe.  This looks more inflammatory to me than malignancy.  There is a airway visible throughout the lesion.  Scattered areas of centrilobular nodules. The patient's images have been independently reviewed by me.    March 2022 CT chest: Near resolution of the left lower lobe opacity likely inflammatory. The patient's images have been independently reviewed by me.    Pulmonary Functions Testing Results:     No data to display          FeNO:   Pathology:   Echocardiogram:   Heart Catheterization:     Assessment & Plan:     ICD-10-CM   1. Smoker  F17.200 Pulmonary Function Test    2. Cigarette smoker  F17.210     3. Mild persistent asthma without complication  [id number]     4. Screening for malignant neoplasm of respiratory organ  Z12.2       Discussion:  This is a 57 year old female, history of Henoch-Schnlein purpura, leukocytoclastic vasculitis in 2006 and a current every day smoker.  She had nodule infiltrate on prior CT consistent with prior community-acquired pneumonia.  Subsequent CT imaging showed resolution.  She is here for lung cancer screening CT follow-up which was negative and read as a lung RADS 1.  Plan: She will need a annual lung cancer screening CT complete in February 2024. She can continue her Stiolto. Will have PFTs completed prior to her next office visit. Continue albuterol as needed.  RTC in 6 months after PFTs.   Current Outpatient Medications:    albuterol (VENTOLIN HFA) 108 (90 Base) MCG/ACT inhaler, Inhale 2 puffs into the lungs every 6 (six) hours as needed., Disp: 6.7 g, Rfl: 11   ALPRAZolam (XANAX) 0.5 MG tablet, Take 1 tablet (0.5 mg total) by mouth 2 (two) times daily as needed for anxiety., Disp: 60 tablet, Rfl: 0   amLODipine (NORVASC) 5 MG tablet, Take 1 tablet (5 mg total) by mouth daily., Disp: 90 tablet, Rfl: 1   aspirin EC 81 MG EC tablet, Take 1 tablet (81 mg total) by mouth daily., Disp: ,  Rfl:    atorvastatin (LIPITOR) 40 MG tablet, Take 1 tablet (40 mg total) by mouth daily at 6 PM., Disp: 90 tablet, Rfl: 2   calcium-vitamin D (OSCAL WITH D) 500-200 MG-UNIT per tablet, Take 1 tablet by mouth daily., Disp: , Rfl:    Cholecalciferol (VITAMIN D3) 2000 units TABS, Take 2,000 Units  by mouth daily., Disp: , Rfl:    desvenlafaxine (PRISTIQ) 50 MG 24 hr tablet, Take 1 tablet (50 mg total) by mouth daily., Disp: 90 tablet, Rfl: 2   enalapril (VASOTEC) 10 MG tablet, Take 1 tablet (10 mg total) by mouth daily., Disp: 90 tablet, Rfl: 2   HYDROcodone bit-homatropine (HYCODAN) 5-1.5 MG/5ML syrup, Take 5 mLs by mouth every 8 (eight) hours as needed for cough., Disp: 120 mL, Rfl: 0   HYDROcodone-acetaminophen (NORCO/VICODIN) 5-325 MG tablet, Take 1 tablet by mouth 2 (two) times daily as needed., Disp: , Rfl:    HYDROcodone-acetaminophen (NORCO/VICODIN) 5-325 MG tablet, Take 1 tablet by mouth 2 (two) times daily as needed for pain, Disp: 60 tablet, Rfl: 0   HYDROcodone-acetaminophen (NORCO/VICODIN) 5-325 MG tablet, Take 1 tablet by mouth 2 (two) times daily as needed for pain, Disp: 60 tablet, Rfl: 0   HYDROcodone-acetaminophen (NORCO/VICODIN) 5-325 MG tablet, Take 1 tablet by mouth 2 (two) times daily as needed for pain, Disp: 60 tablet, Rfl: 0   HYDROcodone-acetaminophen (NORCO/VICODIN) 5-325 MG tablet, Take 1 tablet by mouth 2 (two) times daily as needed for pain, Disp: 60 tablet, Rfl: 0   HYDROcodone-acetaminophen (NORCO/VICODIN) 5-325 MG tablet, Take 1 tablet by mouth twice a day as needed for pain, Disp: 60 tablet, Rfl: 0   HYDROcodone-acetaminophen (NORCO/VICODIN) 5-325 MG tablet, Take 1 tablet by mouth 2 (two) times daily as needed for pain., Disp: 60 tablet, Rfl: 0   HYDROcodone-acetaminophen (NORCO/VICODIN) 5-325 MG tablet, Take 1 tablet by mouth 2 (two) times daily as needed., Disp: 60 tablet, Rfl: 0   HYDROcodone-acetaminophen (NORCO/VICODIN) 5-325 MG tablet, Take 1 tablet by mouth twice a  day as needed for pain, Disp: 60 tablet, Rfl: 0   HYDROcodone-acetaminophen (NORCO/VICODIN) 5-325 MG tablet, Take 1 tablet by mouth twice a day as needed for pain, Disp: 60 tablet, Rfl: 0   HYDROcodone-acetaminophen (NORCO/VICODIN) 5-325 MG tablet, Take 1 tablet by mouth twice a day as needed for pain, Disp: 60 tablet, Rfl: 0   HYDROcodone-acetaminophen (NORCO/VICODIN) 5-325 MG tablet, Take 1 tablet by mouth twice a day as needed for pain, Disp: 60 tablet, Rfl: 0   HYDROcodone-acetaminophen (NORCO/VICODIN) 5-325 MG tablet, Take 1 tablet by mouth twice a day as needed for pain, Disp: 60 tablet, Rfl: 0   HYDROcodone-acetaminophen (NORCO/VICODIN) 5-325 MG tablet, Take 1 tablet by mouth 2 (two) times daily as needed for pain., Disp: 60 tablet, Rfl: 0   HYDROcodone-acetaminophen (NORCO/VICODIN) 5-325 MG tablet, Take 1 tablet by mouth twice a day as needed for pain, Disp: 60 tablet, Rfl: 0   HYDROcodone-acetaminophen (NORCO/VICODIN) 5-325 MG tablet, Take 1 tablet by mouth twice a day as needed for pain, Disp: 60 tablet, Rfl: 0   HYDROcodone-acetaminophen (NORCO/VICODIN) 5-325 MG tablet, Take 1 tablet by mouth twice a day as needed for pain dnfb 12/14/22, Disp: 60 tablet, Rfl: 0   HYDROcodone-acetaminophen (NORCO/VICODIN) 5-325 MG tablet, Take 1 tablet by mouth twice a day as needed for pain, Disp: 60 tablet, Rfl: 0   HYDROcodone-acetaminophen (NORCO/VICODIN) 5-325 MG tablet, Take 1 tablet by mouth twice a day as needed for pain, Disp: 60 tablet, Rfl: 0   HYDROcodone-acetaminophen (NORCO/VICODIN) 5-325 MG tablet, Take 1 tablet by mouth twice a day as needed for pain, Disp: 60 tablet, Rfl: 0   ketoconazole (NIZORAL) 2 % cream, Apply 1 application topically daily., Disp: 15 g, Rfl: 0   levothyroxine (SYNTHROID) 75 MCG tablet, Take 1 tablet (75 mcg total) by mouth daily., Disp: 90 tablet, Rfl:  1   loratadine (CLARITIN) 10 MG tablet, Take 10 mg by mouth daily., Disp: , Rfl:    naloxone (NARCAN) nasal spray 4  mg/0.1 mL, 1 spray into one nostril as a single dose as needed may repeat dose every 2-3 min until patient responsive or EMS arrives, Disp: 2 each, Rfl: 1   nicotine (NICODERM CQ - DOSED IN MG/24 HR) 7 mg/24hr patch, Place 1 patch (7 mg total) onto the skin daily., Disp: 28 patch, Rfl: 0   nitroGLYCERIN (NITROSTAT) 0.4 MG SL tablet, Place 1 tablet (0.4 mg total) under the tongue every 5 (five) minutes as needed for chest pain., Disp: 25 tablet, Rfl: 3   pantoprazole (PROTONIX) 40 MG tablet, Take 1 tablet (40 mg total) by mouth 2 (two) times daily before a meal., Disp: 180 tablet, Rfl: 3   Tiotropium Bromide-Olodaterol (STIOLTO RESPIMAT) 2.5-2.5 MCG/ACT AERS, Inhale 2 puffs into the lungs daily., Disp: 4 g, Rfl: 2   tiZANidine (ZANAFLEX) 2 MG tablet, Take 1 tablet (2 mg total) by mouth 3 (three) times daily as needed., Disp: 90 tablet, Rfl: 1   tiZANidine (ZANAFLEX) 2 MG tablet, Take 1 tablet (2 mg total) by mouth 3 (three) times daily as needed., Disp: 90 tablet, Rfl: 1   Garner Nash, DO [facility] 02/28/2023 9:19 AM

## 2023-03-07 ENCOUNTER — Other Ambulatory Visit (HOSPITAL_COMMUNITY): Payer: Self-pay

## 2023-03-07 DIAGNOSIS — M542 Cervicalgia: Secondary | ICD-10-CM | POA: Diagnosis not present

## 2023-03-07 DIAGNOSIS — M2550 Pain in unspecified joint: Secondary | ICD-10-CM | POA: Diagnosis not present

## 2023-03-07 DIAGNOSIS — M47817 Spondylosis without myelopathy or radiculopathy, lumbosacral region: Secondary | ICD-10-CM | POA: Diagnosis not present

## 2023-03-07 DIAGNOSIS — G894 Chronic pain syndrome: Secondary | ICD-10-CM | POA: Diagnosis not present

## 2023-03-07 MED ORDER — HYDROCODONE-ACETAMINOPHEN 5-325 MG PO TABS
1.0000 | ORAL_TABLET | Freq: Two times a day (BID) | ORAL | 0 refills | Status: DC | PRN
  Filled 2023-04-07: qty 60, 30d supply, fill #0

## 2023-03-07 MED ORDER — HYDROCODONE-ACETAMINOPHEN 5-325 MG PO TABS
1.0000 | ORAL_TABLET | Freq: Two times a day (BID) | ORAL | 0 refills | Status: DC | PRN
  Filled 2023-03-07 – 2023-03-08 (×2): qty 60, 30d supply, fill #0

## 2023-03-08 ENCOUNTER — Other Ambulatory Visit (HOSPITAL_COMMUNITY): Payer: Self-pay

## 2023-03-08 NOTE — Progress Notes (Signed)
Patient Care Team: Margaree Mackintosh, MD as PCP - General (Internal Medicine) Tonny Bollman, MD as PCP - Cardiology (Cardiology)  Visit Date: 03/15/23  Subjective:    Patient ID: [name] , Female   DOB: 07-19-1966, 57 y.o.    MRN: [id number]   57 y.o. Female presents today for a 6 month follow-up. 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  Having right shoulder, right ankle pain. Has been seen in the past by orthopedist, Dr. Magnus Ivan for chronic low back pain and cervicalgia. Seen by [facility] for chronic pain management.  History of hypothyroidism treated with Synthroid 75 mcg daily. TSH normal at 1.06 on 03/14/23.  History of GERD treated with Protonix 40 mg twice daily before a meal.  History of coronary artery disease, anterior wall MI in 2013, hyperlipidemia treated with Lipitor 40 mg daily at 6pm, hypertension treated with Norvasc 5 mg daily, Vasotec 10 mg daily. Cardiac cath demonstrated subtotal occlusion of a small diagonal branch with patency of the LAD and no other obstructive coronary disease. Upcoming follow-up with cardiologist, Dr. Excell Seltzer, 03/17/23.   Blood pressure normal today at 120/80. Lipid panel normal on 03/14/23.  History of anxiety and depression treated with Pristiq 50 mg daily, Xanax 0.5 mg twice daily as needed for anxiety. Discussed situational stress relating to family, work. She is currently caring for her elderly father and  father- in- Social worker.  HGBA1c at 5.9% on 03/14/23. Liver functions normal.  BMI at 32.18. She is interested in going to a weight loss clinic and starting medication for weight loss.Info given on 2 clinics.  History of sleep apnea, COPD. History of asthma treated with albuterol inhaler. Was  advised to quit smoking by myself and Pulmonologist.   History of HS purpura 2006. Resolved.Hospitalized at [facility]. No recurrence.  History of open-angle glaucoma seen by Dr. Dione Booze.   Past Medical History:  Diagnosis Date   ADHD (attention deficit hyperactivity disorder)    Anxiety    Asthma    CAD (coronary artery disease)    a. 11/2012 Acute Anterior STEMI/Cath: Nonobs dzs except subtotal occlusion of small, 1mm D2, EF 55-65%;  b. 11/2012  Echo: EF 60-65%, nl wall motion, Gr 2 DD.   Chronic kidney disease    in setting of HSP 2006   Coronary vasospasm    a. 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  HENT:  Negative for congestion.   Eyes:  Negative for blurred vision.  Respiratory:  Negative for cough and shortness of breath.   Cardiovascular:  Negative for chest pain, palpitations and leg swelling.  Gastrointestinal:  Negative for vomiting.  Musculoskeletal:  Positive for joint pain (Right shoulder, right ankle). Negative for back pain.  Skin:  Negative for rash.  Neurological:  Negative for loss of consciousness and headaches.        Objective:   Vitals: BP 120/80   Pulse 76   Temp 98.4 F (36.9 C) (Tympanic)   Ht 5\' 5"   (1.651 m)   Wt 193 lb 6.4 oz (87.7 kg)   SpO2 97%   BMI 32.18 kg/m    Physical Exam Vitals and nursing note reviewed.  Constitutional:      General: She is not in acute distress.    Appearance: Normal appearance. She is not toxic-appearing.  HENT:     Head: Normocephalic and atraumatic.     Right Ear: Hearing, tympanic membrane and external ear normal. There is impacted cerumen.     Left Ear: Hearing, tympanic membrane and external ear normal. There is impacted cerumen.  Cardiovascular:     Rate and Rhythm: Normal rate and regular rhythm. No extrasystoles are present.    Pulses: Normal pulses.     Heart sounds: Normal heart sounds. No murmur heard.    No friction rub. No gallop.  Pulmonary:     Effort: Pulmonary effort is normal. No respiratory distress.     Breath sounds: Normal breath sounds. No wheezing or rales.  Skin:    General: Skin is warm and dry.  Neurological:     Mental Status: She is alert and oriented to person, place, and time. Mental status is at baseline.  Psychiatric:        Mood and Affect: Mood normal.        Behavior: Behavior normal.        Thought Content: Thought content normal.        Judgment: Judgment normal.       Results:   Studies obtained and personally reviewed by me:   Labs:       Component Value Date/Time   NA 139 08/30/2022 1037   K 4.9 08/30/2022 1037   CL 99 08/30/2022 1037   CO2 32 08/30/2022 1037   GLUCOSE 109 (H) 08/30/2022 1037   BUN 12 08/30/2022 1037   CREATININE 0.84 08/30/2022 1037   CALCIUM 9.8 08/30/2022 1037   PROT 6.3 03/14/2023 0909   ALBUMIN 3.9 06/21/2017 1014   AST 26 03/14/2023 0909   ALT 26 03/14/2023 0909   ALKPHOS 91 06/21/2017 1014   BILITOT 0.5 03/14/2023 0909   GFRNONAA 63 12/24/2020 1242   GFRAA 73 12/24/2020 1242     Lab Results  Component Value Date   WBC 7.9 08/30/2022   HGB 15.6 (H) 08/30/2022   HCT 45.7 (H) 08/30/2022   MCV 94.8 08/30/2022   PLT 203 08/30/2022    Lab Results   Component Value Date   CHOL 147 03/14/2023   HDL 58 03/14/2023   LDLCALC 68 03/14/2023   TRIG 124 03/14/2023   CHOLHDL 2.5 03/14/2023    Lab Results  Component Value Date   HGBA1C 5.9 (H) 03/14/2023     Lab Results  Component Value Date   TSH 1.06 03/14/2023      Assessment & Plan:   Right shoulder pain: Will go back to see Dr.  Magnus Ivan. Ordered Rheumatology studies.  Hypothyroidism: treated with Synthroid 75 mcg daily. TSH normal at 1.06 on 03/14/23.  GERD: treated with Protonix 40 mg twice daily before a meal.  Coronary artery disease, hypertension: treated with Norvasc 5 mg daily, Vasotec 10 mg daily. Upcoming follow-up with cardiologist, Dr. Excell Seltzer, 03/17/23. Blood pressure normal today at 120/80.  Hyperlipidemia: treated with Lipitor 40 mg daily at 6pm. Lipid panel normal on 03/14/23.  Anxiety and depression: treated with Pristiq 50 mg daily, Xanax 0.5 mg twice daily as needed for anxiety. Discussed situational stress relating to family, work. She is currently caring for her elderly father, father in law.  BMI at 32.18: Given information for [facility] and [facility] weight loss centers. Interested in trying medications such as Wegovy.  Asthma: treated with albuterol inhaler as needed. Was advised to quit smoking.   Hair loss: she is having concerns over recent hair loss. TSH is normal. Check B12 and folate. Check iron and TIBC  Return in 6 months for health maintenance exam. Needs to quit smoking.    I,Alexander Ruley,acting as a Neurosurgeon for Margaree Mackintosh, MD.,have documented all relevant documentation on the behalf of Margaree Mackintosh, MD,as directed by  Margaree Mackintosh, MD while in the presence of Margaree Mackintosh, MD.   I, Margaree Mackintosh, MD, have reviewed all documentation for this visit. The documentation on 03/15/23 for the exam, diagnosis, procedures, and orders are all accurate and complete.

## 2023-03-14 ENCOUNTER — Other Ambulatory Visit: Payer: Commercial Managed Care - PPO

## 2023-03-14 DIAGNOSIS — R7302 Impaired glucose tolerance (oral): Secondary | ICD-10-CM | POA: Diagnosis not present

## 2023-03-14 DIAGNOSIS — I2510 Atherosclerotic heart disease of native coronary artery without angina pectoris: Secondary | ICD-10-CM

## 2023-03-14 DIAGNOSIS — E039 Hypothyroidism, unspecified: Secondary | ICD-10-CM | POA: Diagnosis not present

## 2023-03-15 ENCOUNTER — Encounter: Payer: Self-pay | Admitting: Internal Medicine

## 2023-03-15 ENCOUNTER — Other Ambulatory Visit: Payer: Commercial Managed Care - PPO

## 2023-03-15 ENCOUNTER — Ambulatory Visit: Payer: Commercial Managed Care - PPO | Admitting: Internal Medicine

## 2023-03-15 VITALS — BP 120/80 | HR 76 | Temp 98.4°F | Ht 65.0 in | Wt 193.4 lb

## 2023-03-15 DIAGNOSIS — Z6832 Body mass index (BMI) 32.0-32.9, adult: Secondary | ICD-10-CM

## 2023-03-15 DIAGNOSIS — E039 Hypothyroidism, unspecified: Secondary | ICD-10-CM

## 2023-03-15 DIAGNOSIS — F439 Reaction to severe stress, unspecified: Secondary | ICD-10-CM

## 2023-03-15 DIAGNOSIS — R7302 Impaired glucose tolerance (oral): Secondary | ICD-10-CM | POA: Diagnosis not present

## 2023-03-15 DIAGNOSIS — L659 Nonscarring hair loss, unspecified: Secondary | ICD-10-CM

## 2023-03-15 DIAGNOSIS — M2550 Pain in unspecified joint: Secondary | ICD-10-CM

## 2023-03-15 DIAGNOSIS — M25511 Pain in right shoulder: Secondary | ICD-10-CM

## 2023-03-15 DIAGNOSIS — I10 Essential (primary) hypertension: Secondary | ICD-10-CM

## 2023-03-15 DIAGNOSIS — Z8679 Personal history of other diseases of the circulatory system: Secondary | ICD-10-CM

## 2023-03-15 DIAGNOSIS — F17200 Nicotine dependence, unspecified, uncomplicated: Secondary | ICD-10-CM | POA: Diagnosis not present

## 2023-03-15 DIAGNOSIS — G8929 Other chronic pain: Secondary | ICD-10-CM

## 2023-03-15 DIAGNOSIS — R5383 Other fatigue: Secondary | ICD-10-CM | POA: Diagnosis not present

## 2023-03-15 DIAGNOSIS — F419 Anxiety disorder, unspecified: Secondary | ICD-10-CM | POA: Diagnosis not present

## 2023-03-15 DIAGNOSIS — M7918 Myalgia, other site: Secondary | ICD-10-CM

## 2023-03-15 DIAGNOSIS — G4733 Obstructive sleep apnea (adult) (pediatric): Secondary | ICD-10-CM

## 2023-03-15 DIAGNOSIS — F32A Depression, unspecified: Secondary | ICD-10-CM

## 2023-03-15 DIAGNOSIS — I252 Old myocardial infarction: Secondary | ICD-10-CM | POA: Diagnosis not present

## 2023-03-15 LAB — TSH: TSH: 1.06 mIU/L (ref 0.40–4.50)

## 2023-03-15 LAB — HEMOGLOBIN A1C
Hgb A1c MFr Bld: 5.9 % of total Hgb — ABNORMAL HIGH (ref <5.7)
Mean Plasma Glucose: 123 mg/dL
eAG (mmol/L): 6.8 mmol/L

## 2023-03-15 LAB — HEPATIC FUNCTION PANEL
AG Ratio: 2 (calc) (ref 1.0–2.5)
ALT: 26 U/L (ref 6–29)
AST: 26 U/L (ref 10–35)
Albumin: 4.2 g/dL (ref 3.6–5.1)
Alkaline phosphatase (APISO): 88 U/L (ref 37–153)
Bilirubin, Direct: 0.1 mg/dL (ref 0.0–0.2)
Globulin: 2.1 g/dL (calc) (ref 1.9–3.7)
Indirect Bilirubin: 0.4 mg/dL (calc) (ref 0.2–1.2)
Total Bilirubin: 0.5 mg/dL (ref 0.2–1.2)
Total Protein: 6.3 g/dL (ref 6.1–8.1)

## 2023-03-15 LAB — LIPID PANEL
Cholesterol: 147 mg/dL (ref <200)
HDL: 58 mg/dL (ref >=50)
LDL Cholesterol (Calc): 68 mg/dL (calc)
Non-HDL Cholesterol (Calc): 89 mg/dL (calc) (ref <130)
Total CHOL/HDL Ratio: 2.5 (calc) (ref <5.0)
Triglycerides: 124 mg/dL (ref <150)

## 2023-03-15 NOTE — Patient Instructions (Addendum)
Referral to weight loss clinic at patient request to lose weight with medication. For hair loss, check B12, folate, Fe, TIBC. Continue follow up with Cardiology for coronary disease. Please stop smoking. TSH is normal on current dose of thyroid replacement. See Dr. Magnus Ivan for right shoulder pain. RTC here in 6 months for health maintenance exam and fasting labs

## 2023-03-15 NOTE — Addendum Note (Signed)
Addended by: Mary Sella D on: 03/15/2023 10:07 AM   Modules accepted: Orders

## 2023-03-16 NOTE — Progress Notes (Unsigned)
Cardiology Office Note:    Date:  03/17/2023   ID:  Amy BuzzardKimberly Ann Danelle EarthlyLapan Lam, DOB 1966-12-01, MRN [id number]  PCP:  [name], Amy J, MD   [facility] Providers Cardiologist:  Amy BollmanMichael Demarquez Ciolek, MD     Referring MD: [name], Amy J, MD   Chief Complaint  Patient presents with   Coronary Artery Disease    History of Present Illness:    [name] is a 57 y.o. female with a hx of coronary artery disease, presenting for follow-up evaluation.  The patient initially presented in 2013 with an anterior wall MI.  Cardiac catheterization demonstrated subtotal occlusion of a small diagonal branch with patency of the LAD and no other obstructive coronary disease.  LV function was within normal limits.  There was suspicion for coronary vasospasm.  The patient was treated medically with no recurrent ischemic events since that time.  Comorbid conditions include hypertension, tobacco use, gastroesophageal reflux disease, and hypothyroidism.   Here alone today. 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 Job is stressful. She recently saw Dr [name] for annual physical and I have reviewed her notes and lab results. She denies chest pain or shortness of breath. She is still having problems with heart palpitations. Otherwise no cardiac complaints.   Past Medical History:  Diagnosis Date   ADHD (attention deficit hyperactivity disorder)    Anxiety    Asthma    CAD (coronary artery disease)    a. 11/2012 Acute Anterior STEMI/Cath: Nonobs dzs except subtotal occlusion of small, 1mm D2, EF 55-65%;  b. 11/2012  Echo: EF 60-65%, nl wall motion, Gr 2 DD.   Chronic kidney disease    in setting of HSP 2006   Coronary vasospasm    a. 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   Tobacco abuse     Past Surgical History:  Procedure Laterality Date   APPENDECTOMY  1970's   CARDIAC CATHETERIZATION  11/22/2012   "first one" (11/22/2012)   CESAREAN SECTION  1995; 1996; 1999   DILATION AND CURETTAGE OF UTERUS  1990's   LEFT HEART CATHETERIZATION WITH CORONARY ANGIOGRAM N/A 11/22/2012   Procedure: LEFT HEART CATHETERIZATION WITH CORONARY ANGIOGRAM;  Surgeon: Amy BollmanMichael Junior Kenedy, MD;  [facility];  Service: Cardiovascular;  Laterality: N/A;    Current Medications: Current Meds  Medication Sig   albuterol (VENTOLIN HFA) 108 (90 Base) MCG/ACT inhaler Inhale 2 puffs into the lungs every 6 (six) hours as needed.   ALPRAZolam (XANAX) 0.5 MG tablet Take 1 tablet (0.5 mg total) by mouth 2 (two) times daily as needed for anxiety.   amLODipine (NORVASC) 5 MG tablet Take 1 tablet (5 mg total) by mouth daily.   aspirin EC 81 MG EC tablet Take 1 tablet (81 mg total) by mouth daily.   atorvastatin (LIPITOR) 40 MG tablet Take 1 tablet (40 mg total) by mouth daily at 6 PM.   calcium-vitamin D (OSCAL WITH  D) 500-200 MG-UNIT per tablet Take 1 tablet by mouth daily.   Cholecalciferol (VITAMIN D3) 2000 units TABS Take 2,000 Units by mouth daily.   desvenlafaxine (PRISTIQ) 50 MG 24 hr tablet Take 1 tablet (50 mg total) by mouth daily.   enalapril (VASOTEC) 10 MG tablet Take 1 tablet (10 mg total) by mouth daily.   HYDROcodone bit-homatropine (HYCODAN) 5-1.5 MG/5ML syrup Take 5 mLs by mouth every 8 (eight) hours as needed for cough.   HYDROcodone-acetaminophen (NORCO/VICODIN) 5-325 MG tablet Take 1 tablet by mouth twice a day as needed for pain   [START ON 04/04/2023] HYDROcodone-acetaminophen (NORCO/VICODIN) 5-325 MG tablet Take 1  tablet by mouth twice a day as needed for pain   ketoconazole (NIZORAL) 2 % cream Apply 1 application topically daily.   levothyroxine (SYNTHROID) 75 MCG tablet Take 1 tablet (75 mcg total) by mouth daily.   loratadine (CLARITIN) 10 MG tablet Take 10 mg by mouth daily.   naloxone (NARCAN) nasal spray 4 mg/0.1 mL 1 spray into one nostril as a single dose as needed may repeat dose every 2-3 min until patient responsive or EMS arrives   pantoprazole (PROTONIX) 40 MG tablet Take 1 tablet (40 mg total) by mouth 2 (two) times daily before a meal.   Tiotropium Bromide-Olodaterol (STIOLTO RESPIMAT) 2.5-2.5 MCG/ACT AERS Inhale 2 puffs into the lungs daily.   tiZANidine (ZANAFLEX) 2 MG tablet Take 1 tablet (2 mg total) by mouth 3 (three) times daily as needed.   [DISCONTINUED] nicotine (NICODERM CQ - DOSED IN MG/24 HR) 7 mg/24hr patch Place 1 patch (7 mg total) onto the skin daily.   [DISCONTINUED] nitroGLYCERIN (NITROSTAT) 0.4 MG SL tablet Place 1 tablet (0.4 mg total) under the tongue every 5 (five) minutes as needed for chest pain.     Allergies:   Levaquin [levofloxacin] and Nsaids   Social History   Socioeconomic History   Marital status: Married    Spouse name: Not on file   Number of children: Not on file   Years of education: Not on file   Highest education level: Not on file  Occupational History   Occupation: RN/UC    Employer: [employer]    Comment: [employer]  Tobacco Use   Smoking status: Every Day    Packs/day: 0.50    Years: 35.00    Additional pack years: 0.00    Total pack years: 17.50    Types: Cigarettes   Smokeless tobacco: Never   Tobacco comments:    Smokes 2 1/2 pack of cigarettes week. 02/28/23 Tay  Vaping Use   Vaping Use: Never used  Substance and Sexual Activity   Alcohol use: No   Drug use: No   Sexual activity: Yes  Other Topics Concern   Not on file  Social History Narrative   She is married.  3 adult sons.  Does not consume alcohol.   Father is in poor health and lives nearby and she has to check on him frequently.  She has 2 brothers who help take care of their father some.  Father seems to have some dementia.   Social Determinants of Health   Financial Resource Strain: Not on file  Food Insecurity: Not on file  Transportation Needs: Not on file  Physical Activity: Not on file  Stress: Not on file  Social Connections: Not on file     Family History: The patient's family history includes Hypertension in her mother; Stroke in her father. There is  no history of Heart disease.  ROS:   Please see the history of present illness.    All other systems reviewed and are negative.  EKGs/Labs/Other Studies Reviewed:    The following studies were reviewed today: Cardiac Studies & Procedures         MONITORS  CARDIAC EVENT MONITOR 03/16/2021  Narrative 1. The basic rhythm is normal sinus with an average HR of 80 bpm 2. No atrial fibrillation or flutter 3. No high-grade heart block or pathologic pauses 4. There are rare PVC's and occasional supraventricular beats without sustained arrhythmias            EKG:  EKG is ordered today.  The ekg ordered today demonstrates NSR 77 bpm, low voltage QRS, biatrial enlargement  Recent Labs: 03/14/2023: TSH 1.06 03/15/2023: ALT 28; BUN 12; Creat 0.83; Hemoglobin 15.4; Platelets 200; Potassium 4.9; Sodium 145  Recent Lipid Panel    Component Value Date/Time   CHOL 147 03/14/2023 0909   TRIG 124 03/14/2023 0909   HDL 58 03/14/2023 0909   CHOLHDL 2.5 03/14/2023 0909   VLDL 23 06/21/2017 1014   LDLCALC 68 03/14/2023 0909     Risk Assessment/Calculations:                Physical Exam:    VS:  BP 120/80   Pulse 77   Ht 5\' 5"  (1.651 m)   Wt 191 lb 6.4 oz (86.8 kg)   SpO2 98%   BMI 31.85 kg/m     Wt Readings from Last 3 Encounters:  03/17/23 191 lb 6.4 oz (86.8 kg)  03/15/23 193 lb 6.4 oz (87.7 kg)  02/28/23 192 lb 3.2 oz (87.2 kg)     GEN:  Well nourished,  well developed in no acute distress HEENT: Normal NECK: No JVD; No carotid bruits LYMPHATICS: No lymphadenopathy CARDIAC: RRR, no murmurs, rubs, gallops RESPIRATORY:  Clear to auscultation without rales, wheezing or rhonchi  ABDOMEN: Soft, non-tender, non-distended MUSCULOSKELETAL:  No edema; No deformity  SKIN: Warm and dry NEUROLOGIC:  Alert and oriented x 3 PSYCHIATRIC:  Normal affect   ASSESSMENT:    1. Coronary artery disease involving native coronary artery of native heart without angina pectoris   2. Essential hypertension   3. Mixed hyperlipidemia   4. Tobacco use   5. Palpitations    PLAN:    In order of problems listed above:  Stable without angina on amlodipine, ASA, atorvastatin. Continue current management.  BP Well controlled on amlodipine and enalapril Treated with atorvastatin. Recent labs with LDL 68 mg/dL. LFT's normal. Noted to have findings of hepatic steatosis and nodularity question early cirrhosis. Discussed lifestyle modification - diet, exercise, weight loss.  Still smoking not ready to quit. Screening CT done low risk. Check 2D echo, monitor in 2022 benign - reviewed today.      Medication Adjustments/Labs and Tests Ordered: Current medicines are reviewed at length with the patient today.  Concerns regarding medicines are outlined above.  Orders Placed This Encounter  Procedures   EKG 12-Lead   ECHOCARDIOGRAM COMPLETE   Meds ordered this encounter  Medications   nitroGLYCERIN (NITROSTAT) 0.4 MG SL tablet    Sig: Dissolve 1 tablet under the tongue every 5 minutes as needed for chest pain. Max of 3 doses, then 911.    Dispense:  25 tablet    Refill:  6    Patient Instructions  Medication Instructions:  REFILLED Nitroglycerin *If you need a refill on your cardiac medications before your  next appointment, please call your pharmacy*   Lab Work: NONE If you have labs (blood work) drawn today and your tests are completely normal, you will  receive your results only by: MyChart Message (if you have MyChart) OR A paper copy in the mail If you have any lab test that is abnormal or we need to change your treatment, we will call you to review the results.   Testing/Procedures: ECHO Your physician has requested that you have an echocardiogram. Echocardiography is a painless test that uses sound waves to create images of your heart. It provides your doctor with information about the size and shape of your heart and how well your heart's chambers and valves are working. This procedure takes approximately one hour. There are no restrictions for this procedure. Please do NOT wear cologne, perfume, aftershave, or lotions (deodorant is allowed). Please arrive 15 minutes prior to your appointment time.  Follow-Up: At [facility], you and your health needs are our priority.  As part of our continuing mission to provide you with exceptional heart care, we have created designated Provider Care Teams.  These Care Teams include your primary Cardiologist (physician) and Advanced Practice Providers (APPs -  Physician Assistants and Nurse Practitioners) who all work together to provide you with the care you need, when you need it.  Your next appointment:   1 year(s)  Provider:   Tonny Bollman, MD        Signed, Amy Bollman, MD  03/17/2023 5:25 PM    [facility]

## 2023-03-17 ENCOUNTER — Encounter: Payer: Self-pay | Admitting: Cardiovascular Disease

## 2023-03-17 ENCOUNTER — Ambulatory Visit: Payer: Commercial Managed Care - PPO | Attending: Cardiovascular Disease | Admitting: Cardiovascular Disease

## 2023-03-17 ENCOUNTER — Other Ambulatory Visit (HOSPITAL_COMMUNITY): Payer: Self-pay

## 2023-03-17 VITALS — BP 120/80 | HR 77 | Ht 65.0 in | Wt 191.4 lb

## 2023-03-17 DIAGNOSIS — E782 Mixed hyperlipidemia: Secondary | ICD-10-CM | POA: Diagnosis not present

## 2023-03-17 DIAGNOSIS — R002 Palpitations: Secondary | ICD-10-CM | POA: Diagnosis not present

## 2023-03-17 DIAGNOSIS — I2510 Atherosclerotic heart disease of native coronary artery without angina pectoris: Secondary | ICD-10-CM

## 2023-03-17 DIAGNOSIS — Z720 Tobacco use: Secondary | ICD-10-CM | POA: Diagnosis not present

## 2023-03-17 DIAGNOSIS — I10 Essential (primary) hypertension: Secondary | ICD-10-CM | POA: Diagnosis not present

## 2023-03-17 LAB — CBC WITH DIFFERENTIAL/PLATELET
Absolute Monocytes: 488 cells/uL (ref 200–950)
Basophils Absolute: 40 cells/uL (ref 0–200)
Basophils Relative: 0.5 %
Eosinophils Absolute: 296 cells/uL (ref 15–500)
Eosinophils Relative: 3.7 %
HCT: 45.3 % — ABNORMAL HIGH (ref 35.0–45.0)
Hemoglobin: 15.4 g/dL (ref 11.7–15.5)
Lymphs Abs: 2992 cells/uL (ref 850–3900)
MCH: 31.7 pg (ref 27.0–33.0)
MCHC: 34 g/dL (ref 32.0–36.0)
MCV: 93.2 fL (ref 80.0–100.0)
MPV: 12.2 fL (ref 7.5–12.5)
Monocytes Relative: 6.1 %
Neutro Abs: 4184 cells/uL (ref 1500–7800)
Neutrophils Relative %: 52.3 %
Platelets: 200 10*3/uL (ref 140–400)
RBC: 4.86 10*6/uL (ref 3.80–5.10)
RDW: 11.7 % (ref 11.0–15.0)
Total Lymphocyte: 37.4 %
WBC: 8 10*3/uL (ref 3.8–10.8)

## 2023-03-17 LAB — COMPLETE METABOLIC PANEL WITH GFR
AG Ratio: 1.9 (calc) (ref 1.0–2.5)
ALT: 28 U/L (ref 6–29)
AST: 28 U/L (ref 10–35)
Albumin: 4.5 g/dL (ref 3.6–5.1)
Alkaline phosphatase (APISO): 89 U/L (ref 37–153)
BUN: 12 mg/dL (ref 7–25)
CO2: 30 mmol/L (ref 20–32)
Calcium: 10 mg/dL (ref 8.6–10.4)
Chloride: 106 mmol/L (ref 98–110)
Creat: 0.83 mg/dL (ref 0.50–1.03)
Globulin: 2.4 g/dL (calc) (ref 1.9–3.7)
Glucose, Bld: 114 mg/dL — ABNORMAL HIGH (ref 65–99)
Potassium: 4.9 mmol/L (ref 3.5–5.3)
Sodium: 145 mmol/L (ref 135–146)
Total Bilirubin: 0.4 mg/dL (ref 0.2–1.2)
Total Protein: 6.9 g/dL (ref 6.1–8.1)
eGFR: 82 mL/min/{1.73_m2} (ref >=60)

## 2023-03-17 LAB — IRON,TIBC AND FERRITIN PANEL
%SAT: 46 % (calc) — ABNORMAL HIGH (ref 16–45)
Ferritin: 21 ng/mL (ref 16–232)
Iron: 132 ug/dL (ref 45–160)
TIBC: 290 mcg/dL (calc) (ref 250–450)

## 2023-03-17 LAB — B12 AND FOLATE PANEL
Folate: >24 ng/mL
Vitamin B-12: 536 pg/mL (ref 200–1100)

## 2023-03-17 LAB — SEDIMENTATION RATE: Sed Rate: 6 mm/h (ref 0–30)

## 2023-03-17 LAB — ANA: Anti Nuclear Antibody (ANA): POSITIVE — AB

## 2023-03-17 LAB — RHEUMATOID FACTOR: Rheumatoid fact SerPl-aCnc: <14 IU/mL (ref <14)

## 2023-03-17 LAB — CYCLIC CITRUL PEPTIDE ANTIBODY, IGG: Cyclic Citrullin Peptide Ab: <16 UNITS

## 2023-03-17 LAB — ANTI-NUCLEAR AB-TITER (ANA TITER): ANA Titer 1: 1:40 {titer} — ABNORMAL HIGH

## 2023-03-17 MED ORDER — NITROGLYCERIN 0.4 MG SL SUBL
SUBLINGUAL_TABLET | SUBLINGUAL | 6 refills | Status: AC
  Filled 2023-03-17: qty 25, 5d supply, fill #0

## 2023-03-17 NOTE — Patient Instructions (Signed)
Medication Instructions:  REFILLED Nitroglycerin *If you need a refill on your cardiac medications before your next appointment, please call your pharmacy*   Lab Work: NONE If you have labs (blood work) drawn today and your tests are completely normal, you will receive your results only by: MyChart Message (if you have MyChart) OR A paper copy in the mail If you have any lab test that is abnormal or we need to change your treatment, we will call you to review the results.   Testing/Procedures: ECHO Your physician has requested that you have an echocardiogram. Echocardiography is a painless test that uses sound waves to create images of your heart. It provides your doctor with information about the size and shape of your heart and how well your heart's chambers and valves are working. This procedure takes approximately one hour. There are no restrictions for this procedure. Please do NOT wear cologne, perfume, aftershave, or lotions (deodorant is allowed). Please arrive 15 minutes prior to your appointment time.  Follow-Up: At [facility], you and your health needs are our priority.  As part of our continuing mission to provide you with exceptional heart care, we have created designated Provider Care Teams.  These Care Teams include your primary Cardiologist (physician) and Advanced Practice Providers (APPs -  Physician Assistants and Nurse Practitioners) who all work together to provide you with the care you need, when you need it.  Your next appointment:   1 year(s)  Provider:   Tonny Bollman, MD

## 2023-03-18 ENCOUNTER — Other Ambulatory Visit (HOSPITAL_COMMUNITY): Payer: Commercial Managed Care - PPO

## 2023-03-20 ENCOUNTER — Other Ambulatory Visit: Payer: Self-pay | Admitting: Internal Medicine

## 2023-03-20 MED ORDER — LEVOTHYROXINE SODIUM 75 MCG PO TABS
75.0000 ug | ORAL_TABLET | Freq: Every day | ORAL | 1 refills | Status: DC
  Filled 2023-03-20: qty 90, 90d supply, fill #0
  Filled 2023-06-27: qty 90, 90d supply, fill #1

## 2023-03-21 ENCOUNTER — Other Ambulatory Visit (HOSPITAL_COMMUNITY): Payer: Self-pay

## 2023-04-07 ENCOUNTER — Other Ambulatory Visit (HOSPITAL_COMMUNITY): Payer: Self-pay

## 2023-04-07 ENCOUNTER — Ambulatory Visit (HOSPITAL_COMMUNITY): Payer: Commercial Managed Care - PPO | Attending: Cardiovascular Disease

## 2023-04-07 DIAGNOSIS — R002 Palpitations: Secondary | ICD-10-CM | POA: Insufficient documentation

## 2023-04-07 DIAGNOSIS — I10 Essential (primary) hypertension: Secondary | ICD-10-CM | POA: Diagnosis not present

## 2023-04-07 DIAGNOSIS — E782 Mixed hyperlipidemia: Secondary | ICD-10-CM | POA: Insufficient documentation

## 2023-04-07 DIAGNOSIS — I2510 Atherosclerotic heart disease of native coronary artery without angina pectoris: Secondary | ICD-10-CM | POA: Diagnosis not present

## 2023-04-07 DIAGNOSIS — Z720 Tobacco use: Secondary | ICD-10-CM | POA: Diagnosis not present

## 2023-04-08 LAB — ECHOCARDIOGRAM COMPLETE
Area-P 1/2: 4.06 cm2
S' Lateral: 2.6 cm

## 2023-04-19 ENCOUNTER — Other Ambulatory Visit: Payer: Self-pay | Admitting: Internal Medicine

## 2023-04-19 ENCOUNTER — Ambulatory Visit: Payer: Commercial Managed Care - PPO | Admitting: Internal Medicine

## 2023-04-19 ENCOUNTER — Other Ambulatory Visit (HOSPITAL_COMMUNITY): Payer: Self-pay

## 2023-04-19 ENCOUNTER — Telehealth: Payer: Self-pay | Admitting: Internal Medicine

## 2023-04-19 VITALS — BP 112/70 | HR 81 | Temp 97.4°F

## 2023-04-19 DIAGNOSIS — H9202 Otalgia, left ear: Secondary | ICD-10-CM | POA: Diagnosis not present

## 2023-04-19 DIAGNOSIS — J22 Unspecified acute lower respiratory infection: Secondary | ICD-10-CM | POA: Diagnosis not present

## 2023-04-19 MED ORDER — HYDROCODONE BIT-HOMATROP MBR 5-1.5 MG/5ML PO SOLN
5.0000 mL | Freq: Three times a day (TID) | ORAL | 0 refills | Status: DC | PRN
  Filled 2023-04-19: qty 120, 8d supply, fill #0

## 2023-04-19 MED ORDER — METHYLPREDNISOLONE ACETATE 80 MG/ML IJ SUSP
80.0000 mg | Freq: Once | INTRAMUSCULAR | Status: AC
  Administered 2023-04-19: 80 mg via INTRAMUSCULAR

## 2023-04-19 MED ORDER — CLARITHROMYCIN 500 MG PO TABS
500.0000 mg | ORAL_TABLET | Freq: Two times a day (BID) | ORAL | 0 refills | Status: DC
  Filled 2023-04-19: qty 20, 10d supply, fill #0

## 2023-04-19 NOTE — Telephone Encounter (Signed)
[name] [phone]  [name] called to say she has a cough, tickle in her throat, drainage sometime and her chest is beginning to hurt, this started on Thursday she did do COVID test and it was negative. I scheduled her for 12:00 today.

## 2023-04-19 NOTE — Progress Notes (Signed)
Patient Care Team: Margaree Mackintosh, MD as PCP - General (Internal Medicine) Tonny Bollman, MD as PCP - Cardiology (Cardiology)  Visit Date: 04/19/23  Subjective:    Patient ID: [name] , Female   DOB: Jul 08, 1966, 57 y.o.    MRN: [id number]   57 y.o. Female presents today for cough, post nasal drip, hoarseness since 04/14/23. Denies sore throat. Ears are itching.  Past Medical History:  Diagnosis Date   ADHD (attention deficit hyperactivity disorder)    Anxiety    Asthma    CAD (coronary artery disease)    a. 11/2012 Acute Anterior STEMI/Cath: Nonobs dzs except subtotal occlusion of small, 1mm D2, EF 55-65%;  b. 11/2012  Echo: EF 60-65%, nl wall motion, Gr 2 DD.   Chronic kidney disease    in setting of HSP 2006   Coronary vasospasm (HCC)    a. 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  3 adult sons.  Does not consume alcohol.  Father is in poor health and lives nearby and she has to check on him frequently.  She has 2 brothers who help take care of their father some.  Father seems to have some dementia.      Review of Systems  Constitutional:  Negative for fever and malaise/fatigue.  HENT:  Negative for congestion.        (+) Post nasal drip  Eyes:  Negative for blurred vision.   Respiratory:  Positive for cough. Negative for shortness of breath.   Cardiovascular:  Negative for chest pain, palpitations and leg swelling.  Gastrointestinal:  Negative for vomiting.  Musculoskeletal:  Negative for back pain.  Skin:  Negative for rash.  Neurological:  Negative for loss of consciousness and headaches.        Objective:   Vitals: BP 112/70   Pulse 81   Temp (!) 97.4 F (36.3 C) (Temporal)   SpO2 97%    Physical Exam Vitals and nursing note reviewed.  Constitutional:      General: She is not in acute distress.    Appearance: Normal appearance. She is not toxic-appearing.  HENT:     Head: Normocephalic and atraumatic.     Right Ear: Hearing and external ear normal.     Left Ear: Hearing and external ear normal.     Ears:     Comments: Right and left TM obstructed by cerumen.    Mouth/Throat:     Comments: Pharynx injected without exudate. Pulmonary:     Effort: Pulmonary effort is normal.     Breath sounds: Rhonchi present.     Comments: Congested cough.  Skin:    General: Skin is warm and dry.  Neurological:     Mental Status: She is alert and oriented to person, place, and time. Mental status is at baseline.  Psychiatric:        Mood and Affect: Mood normal.        Behavior: Behavior normal.        Thought Content: Thought content normal.        Judgment: Judgment normal.       Results:   Studies obtained and personally reviewed by me:   Labs:       Component Value Date/Time   NA 145 03/15/2023 1020   K 4.9 03/15/2023 1020   CL 106 03/15/2023 1020   CO2 30 03/15/2023 1020   GLUCOSE 114 (H) 03/15/2023 1020   BUN 12 03/15/2023 1020   CREATININE 0.83 03/15/2023 1020   CALCIUM 10.0 03/15/2023 1020   PROT 6.9 03/15/2023 1020   ALBUMIN 3.9 06/21/2017 1014   AST 28 03/15/2023 1020   ALT 28 03/15/2023 1020   ALKPHOS 91 06/21/2017 1014   BILITOT 0.4 03/15/2023 1020   GFRNONAA 63 12/24/2020 1242   GFRAA 73 12/24/2020 1242     Lab  Results  Component Value Date   WBC 8.0 03/15/2023   HGB 15.4 03/15/2023   HCT 45.3 (H) 03/15/2023   MCV 93.2 03/15/2023   PLT 200 03/15/2023    Lab Results  Component Value Date   CHOL 147 03/14/2023   HDL 58 03/14/2023   LDLCALC 68 03/14/2023   TRIG 124 03/14/2023   CHOLHDL 2.5 03/14/2023    Lab Results  Component Value Date   HGBA1C 5.9 (H) 03/14/2023     Lab Results  Component Value Date   TSH 1.06 03/14/2023      Assessment & Plan:   Acute upper respiratory infection: prescribed clarithromycin 500 mg twice daily, Hycodan syrup 1 tsp every 8 hours as needed for cough. Administered Depo 80 mg IM. She has an albuterol inhaler. Contact us if symptoms do not improve.    I,Alexander Ruley,acting as a Neurosurgeon for Margaree Mackintosh, MD.,have documented all relevant documentation on the behalf of Margaree Mackintosh, MD,as directed by  Margaree Mackintosh, MD while in the presence of Margaree Mackintosh, MD.   I, Margaree Mackintosh, MD, have reviewed all documentation for this visit. The documentation on 04/24/23 for the exam, diagnosis, procedures, and orders are all accurate and complete.

## 2023-04-20 ENCOUNTER — Other Ambulatory Visit: Payer: Self-pay | Admitting: Cardiovascular Disease

## 2023-04-20 ENCOUNTER — Other Ambulatory Visit (HOSPITAL_COMMUNITY): Payer: Self-pay

## 2023-04-20 DIAGNOSIS — I10 Essential (primary) hypertension: Secondary | ICD-10-CM

## 2023-04-20 DIAGNOSIS — I2510 Atherosclerotic heart disease of native coronary artery without angina pectoris: Secondary | ICD-10-CM

## 2023-04-20 MED ORDER — DESVENLAFAXINE SUCCINATE ER 50 MG PO TB24
50.0000 mg | ORAL_TABLET | Freq: Every day | ORAL | 2 refills | Status: DC
  Filled 2023-04-20: qty 90, 90d supply, fill #0
  Filled 2023-07-17: qty 90, 90d supply, fill #1
  Filled 2023-10-14: qty 90, 90d supply, fill #2

## 2023-04-20 MED ORDER — AMLODIPINE BESYLATE 5 MG PO TABS
5.0000 mg | ORAL_TABLET | Freq: Every day | ORAL | 3 refills | Status: DC
  Filled 2023-04-20: qty 90, 90d supply, fill #0
  Filled 2023-07-17: qty 90, 90d supply, fill #1
  Filled 2023-10-14: qty 90, 90d supply, fill #2

## 2023-04-21 ENCOUNTER — Other Ambulatory Visit (HOSPITAL_COMMUNITY): Payer: Self-pay

## 2023-04-24 ENCOUNTER — Encounter: Payer: Self-pay | Admitting: Internal Medicine

## 2023-04-24 NOTE — Patient Instructions (Signed)
I sent for 10 days.  Take Hycodan syrup sparingly every 8 hours as needed for cough.  Use albuterol inhaler as needed.  Given Depo-Medrol 80 mg IM for congestion.  Call us if not improving within a few days or sooner if worse.

## 2023-04-27 ENCOUNTER — Other Ambulatory Visit: Payer: Self-pay | Admitting: Pulmonary Disease

## 2023-04-27 ENCOUNTER — Other Ambulatory Visit (HOSPITAL_COMMUNITY): Payer: Self-pay

## 2023-04-27 MED ORDER — STIOLTO RESPIMAT 2.5-2.5 MCG/ACT IN AERS
2.0000 | INHALATION_SPRAY | Freq: Every day | RESPIRATORY_TRACT | 2 refills | Status: DC
  Filled 2023-04-27: qty 4, 30d supply, fill #0
  Filled 2023-05-24: qty 4, 30d supply, fill #1
  Filled 2023-07-17: qty 4, 30d supply, fill #2

## 2023-05-03 ENCOUNTER — Other Ambulatory Visit (HOSPITAL_COMMUNITY): Payer: Self-pay

## 2023-05-03 DIAGNOSIS — M542 Cervicalgia: Secondary | ICD-10-CM | POA: Diagnosis not present

## 2023-05-03 DIAGNOSIS — G894 Chronic pain syndrome: Secondary | ICD-10-CM | POA: Diagnosis not present

## 2023-05-03 DIAGNOSIS — M47817 Spondylosis without myelopathy or radiculopathy, lumbosacral region: Secondary | ICD-10-CM | POA: Diagnosis not present

## 2023-05-03 DIAGNOSIS — M2550 Pain in unspecified joint: Secondary | ICD-10-CM | POA: Diagnosis not present

## 2023-05-03 DIAGNOSIS — Z79891 Long term (current) use of opiate analgesic: Secondary | ICD-10-CM | POA: Diagnosis not present

## 2023-05-03 MED ORDER — HYDROCODONE-ACETAMINOPHEN 5-325 MG PO TABS
1.0000 | ORAL_TABLET | Freq: Two times a day (BID) | ORAL | 0 refills | Status: DC | PRN
  Filled 2023-05-03 – 2023-05-05 (×2): qty 60, 30d supply, fill #0

## 2023-05-03 MED ORDER — HYDROCODONE-ACETAMINOPHEN 5-325 MG PO TABS
1.0000 | ORAL_TABLET | Freq: Two times a day (BID) | ORAL | 0 refills | Status: DC | PRN
  Filled 2023-06-10: qty 60, 30d supply, fill #0

## 2023-05-05 ENCOUNTER — Other Ambulatory Visit: Payer: Self-pay

## 2023-05-05 ENCOUNTER — Other Ambulatory Visit (HOSPITAL_COMMUNITY): Payer: Self-pay

## 2023-05-25 ENCOUNTER — Other Ambulatory Visit: Payer: Self-pay

## 2023-06-10 ENCOUNTER — Other Ambulatory Visit (HOSPITAL_COMMUNITY): Payer: Self-pay

## 2023-06-27 ENCOUNTER — Other Ambulatory Visit: Payer: Self-pay | Admitting: Cardiovascular Disease

## 2023-06-27 ENCOUNTER — Other Ambulatory Visit (HOSPITAL_COMMUNITY): Payer: Self-pay

## 2023-06-27 DIAGNOSIS — M542 Cervicalgia: Secondary | ICD-10-CM | POA: Diagnosis not present

## 2023-06-27 DIAGNOSIS — I10 Essential (primary) hypertension: Secondary | ICD-10-CM

## 2023-06-27 DIAGNOSIS — M47817 Spondylosis without myelopathy or radiculopathy, lumbosacral region: Secondary | ICD-10-CM | POA: Diagnosis not present

## 2023-06-27 DIAGNOSIS — I2510 Atherosclerotic heart disease of native coronary artery without angina pectoris: Secondary | ICD-10-CM

## 2023-06-27 DIAGNOSIS — G894 Chronic pain syndrome: Secondary | ICD-10-CM | POA: Diagnosis not present

## 2023-06-27 DIAGNOSIS — M2550 Pain in unspecified joint: Secondary | ICD-10-CM | POA: Diagnosis not present

## 2023-06-27 MED ORDER — HYDROCODONE-ACETAMINOPHEN 5-325 MG PO TABS
1.0000 | ORAL_TABLET | Freq: Two times a day (BID) | ORAL | 0 refills | Status: DC | PRN
  Filled 2023-07-07 – 2023-07-08 (×2): qty 60, 30d supply, fill #0

## 2023-06-27 MED ORDER — ATORVASTATIN CALCIUM 40 MG PO TABS
40.0000 mg | ORAL_TABLET | Freq: Every day | ORAL | 3 refills | Status: DC
  Filled 2023-06-27: qty 90, 90d supply, fill #0
  Filled 2023-10-24: qty 90, 90d supply, fill #1
  Filled 2024-01-25: qty 90, 90d supply, fill #2
  Filled 2024-04-22: qty 90, 90d supply, fill #3

## 2023-06-27 MED ORDER — HYDROCODONE-ACETAMINOPHEN 5-325 MG PO TABS: 1.0000 | ORAL_TABLET | Freq: Two times a day (BID) | ORAL | 0 refills | Status: DC | PRN

## 2023-06-28 ENCOUNTER — Other Ambulatory Visit (HOSPITAL_COMMUNITY): Payer: Self-pay

## 2023-07-05 ENCOUNTER — Other Ambulatory Visit (HOSPITAL_COMMUNITY): Payer: Self-pay

## 2023-07-07 ENCOUNTER — Other Ambulatory Visit (HOSPITAL_COMMUNITY): Payer: Self-pay

## 2023-07-08 ENCOUNTER — Other Ambulatory Visit (HOSPITAL_COMMUNITY): Payer: Self-pay

## 2023-07-17 ENCOUNTER — Other Ambulatory Visit: Payer: Self-pay | Admitting: Internal Medicine

## 2023-07-18 ENCOUNTER — Encounter: Payer: Self-pay | Admitting: Internal Medicine

## 2023-07-18 ENCOUNTER — Telehealth: Payer: Self-pay | Admitting: Internal Medicine

## 2023-07-18 ENCOUNTER — Ambulatory Visit: Payer: Commercial Managed Care - PPO | Admitting: Internal Medicine

## 2023-07-18 ENCOUNTER — Other Ambulatory Visit (HOSPITAL_COMMUNITY): Payer: Self-pay

## 2023-07-18 VITALS — BP 120/80 | HR 90 | Temp 100.1°F | Ht 65.0 in | Wt 191.0 lb

## 2023-07-18 DIAGNOSIS — F17200 Nicotine dependence, unspecified, uncomplicated: Secondary | ICD-10-CM | POA: Diagnosis not present

## 2023-07-18 DIAGNOSIS — R7302 Impaired glucose tolerance (oral): Secondary | ICD-10-CM | POA: Diagnosis not present

## 2023-07-18 DIAGNOSIS — I10 Essential (primary) hypertension: Secondary | ICD-10-CM

## 2023-07-18 DIAGNOSIS — Z8679 Personal history of other diseases of the circulatory system: Secondary | ICD-10-CM | POA: Diagnosis not present

## 2023-07-18 DIAGNOSIS — I252 Old myocardial infarction: Secondary | ICD-10-CM | POA: Diagnosis not present

## 2023-07-18 DIAGNOSIS — G4733 Obstructive sleep apnea (adult) (pediatric): Secondary | ICD-10-CM

## 2023-07-18 DIAGNOSIS — J22 Unspecified acute lower respiratory infection: Secondary | ICD-10-CM

## 2023-07-18 MED ORDER — PANTOPRAZOLE SODIUM 40 MG PO TBEC
40.0000 mg | DELAYED_RELEASE_TABLET | Freq: Two times a day (BID) | ORAL | 3 refills | Status: DC
  Filled 2023-07-18: qty 180, 90d supply, fill #0
  Filled 2023-10-14: qty 180, 90d supply, fill #1
  Filled 2024-01-11: qty 180, 90d supply, fill #2
  Filled 2024-04-08: qty 180, 90d supply, fill #3

## 2023-07-18 MED ORDER — HYDROCODONE BIT-HOMATROP MBR 5-1.5 MG/5ML PO SOLN
5.0000 mL | Freq: Three times a day (TID) | ORAL | 0 refills | Status: DC | PRN
  Filled 2023-07-18: qty 120, 8d supply, fill #0

## 2023-07-18 MED ORDER — CLARITHROMYCIN 500 MG PO TABS
500.0000 mg | ORAL_TABLET | Freq: Two times a day (BID) | ORAL | 0 refills | Status: DC
  Filled 2023-07-18: qty 15, 7d supply, fill #0
  Filled 2023-07-18: qty 5, 3d supply, fill #0
  Filled 2023-07-18 (×2): qty 20, 10d supply, fill #0

## 2023-07-18 NOTE — Progress Notes (Signed)
Patient Care Team: Margaree Mackintosh, MD as PCP - General (Internal Medicine) Tonny Bollman, MD as PCP - Cardiology (Cardiology)  Visit Date: 07/18/23  Subjective:    Patient ID: [name] , Female   DOB: 1966-09-12, 57 y.o.    MRN: [id number]   57 y.o. Female presents today for same day office visit for acute respiratory infection.  Onset of her symptoms was about a week ago. She has generally been feeling unwell with fatigue, cough, and sputum production (may be clear thick, or yellow tinged sputum). At night, she hears herself wheezing and notices a "popping" with respiration. Today she was aware of breathing slightly faster than usual with some activity around the house. She denies significant shortness of breath. She doesn't have to stop to rest. She hasn't been sleeping well.  She has been testing for Covid-19 daily; testing has been consistently negative.   Past Medical History:  Diagnosis Date   ADHD (attention deficit hyperactivity disorder)    Anxiety    Asthma    CAD (coronary artery disease)    a. 11/2012 Acute Anterior STEMI/Cath: Nonobs dzs except subtotal occlusion of small, 1mm D2, EF 55-65%;  b. 11/2012  Echo: EF 60-65%, nl wall motion, Gr 2 DD.   Chronic kidney disease    in setting of HSP 2006   Coronary vasospasm (HCC)    a. 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  3 adult sons.  Does not consume alcohol.  Father is in poor health and lives nearby and she has to check on him frequently.  She has 2 brothers who help take care of their father some.  Father seems to have some dementia.      Review of Systems  Constitutional:  Positive for malaise/fatigue. Negative for fever.  HENT:  Negative for congestion.   Eyes:  Negative for blurred vision.  Respiratory:  Positive for cough, sputum production and wheezing. Negative for shortness of breath.   Cardiovascular:  Negative for chest pain, palpitations and leg swelling.  Gastrointestinal:  Negative for vomiting.  Musculoskeletal:  Negative for back pain.  Skin:  Negative for rash.  Neurological:  Negative for loss of consciousness and headaches.        Objective:   Vitals: BP 120/80   Pulse 90   Temp 100.1 F (37.8 C)   Ht 5\' 5"  (1.651 m)   Wt 191 lb (86.6 kg)   SpO2 97%   BMI 31.78 kg/m    Physical Exam Vitals and nursing note reviewed.  Constitutional:      General: She is not in acute distress.  Appearance: Normal appearance. She is not toxic-appearing.  HENT:     Head: Normocephalic and atraumatic.     Right Ear: Tympanic membrane, ear canal and external ear normal.     Left Ear: External ear normal.     Ears:     Comments: Left TM not visualized due to presence of cerumen.     Mouth/Throat:     Pharynx: Posterior oropharyngeal erythema present.  Pulmonary:     Effort: Pulmonary effort is normal.     Breath sounds: Decreased breath sounds present. No rales.     Comments: Decreased breath sounds at lung bases bilaterally. Skin:    General: Skin is warm and dry.  Neurological:     Mental Status: She is alert and oriented to person, place, and time. Mental status is at baseline.  Psychiatric:        Mood and Affect: Mood normal.        Behavior: Behavior normal.         Thought Content: Thought content normal.        Judgment: Judgment normal.       Results:   Studies obtained and personally reviewed by me:    Labs:       Component Value Date/Time   NA 145 03/15/2023 1020   K 4.9 03/15/2023 1020   CL 106 03/15/2023 1020   CO2 30 03/15/2023 1020   GLUCOSE 114 (H) 03/15/2023 1020   BUN 12 03/15/2023 1020   CREATININE 0.83 03/15/2023 1020   CALCIUM 10.0 03/15/2023 1020   PROT 6.9 03/15/2023 1020   ALBUMIN 3.9 06/21/2017 1014   AST 28 03/15/2023 1020   ALT 28 03/15/2023 1020   ALKPHOS 91 06/21/2017 1014   BILITOT 0.4 03/15/2023 1020   GFRNONAA 63 12/24/2020 1242   GFRAA 73 12/24/2020 1242     Lab Results  Component Value Date   WBC 8.0 03/15/2023   HGB 15.4 03/15/2023   HCT 45.3 (H) 03/15/2023   MCV 93.2 03/15/2023   PLT 200 03/15/2023    Lab Results  Component Value Date   CHOL 147 03/14/2023   HDL 58 03/14/2023   LDLCALC 68 03/14/2023   TRIG 124 03/14/2023   CHOLHDL 2.5 03/14/2023    Lab Results  Component Value Date   HGBA1C 5.9 (H) 03/14/2023     Lab Results  Component Value Date   TSH 1.06 03/14/2023       Assessment & Plan:   Acute lower respiratory infection - Negative Covid-19 test. Prescribe Biaxin 500 mg twice daily for 10 days, and Hycodan cough syrup to take as needed for cough. She will let us know tomorrow how she is feeling and if she needs a note for work. We will consider a chest x-ray if her symptoms worsen.   I,Mathew Stumpf,acting as a Neurosurgeon for Margaree Mackintosh, MD.,have documented all relevant documentation on the behalf of Margaree Mackintosh, MD,as directed by  Margaree Mackintosh, MD while in the presence of Margaree Mackintosh, MD.   I, Margaree Mackintosh, MD, have reviewed all documentation for this visit. The documentation on 08/06/23 for the exam, diagnosis, procedures, and orders are all accurate and complete.

## 2023-07-18 NOTE — Telephone Encounter (Signed)
[name] [phone]  [name] called to say she has been sick since last Thursday with Cough, tired, wheezing, headache, negative COVID on Thursday and again today, she has had no fever. I have scheduled her to come in this afternoon at 4:30

## 2023-07-19 ENCOUNTER — Other Ambulatory Visit (HOSPITAL_COMMUNITY): Payer: Self-pay

## 2023-07-19 ENCOUNTER — Other Ambulatory Visit: Payer: Self-pay

## 2023-07-20 ENCOUNTER — Other Ambulatory Visit (HOSPITAL_COMMUNITY): Payer: Self-pay

## 2023-08-06 NOTE — Patient Instructions (Signed)
You have been diagnosed with an acute lower respiratory infection.  COVID-19 testing negative.  Prescribed Biaxin 500 mg twice daily for 10 days and Hycodan cough syrup 1 teaspoon every 8 hours as needed for cough.  Please  let us know if  you need to be out of work.  We will consider chest x-ray if symptoms worsen.  Rest and stay well-hydrated.

## 2023-08-09 ENCOUNTER — Other Ambulatory Visit (HOSPITAL_COMMUNITY): Payer: Self-pay

## 2023-08-10 ENCOUNTER — Other Ambulatory Visit (HOSPITAL_COMMUNITY): Payer: Self-pay

## 2023-08-10 MED ORDER — HYDROCODONE-ACETAMINOPHEN 5-325 MG PO TABS
1.0000 | ORAL_TABLET | Freq: Two times a day (BID) | ORAL | 0 refills | Status: DC | PRN
  Filled 2023-08-10: qty 60, 30d supply, fill #0

## 2023-08-18 ENCOUNTER — Other Ambulatory Visit (HOSPITAL_COMMUNITY): Payer: Self-pay

## 2023-08-18 DIAGNOSIS — G894 Chronic pain syndrome: Secondary | ICD-10-CM | POA: Diagnosis not present

## 2023-08-18 DIAGNOSIS — M47817 Spondylosis without myelopathy or radiculopathy, lumbosacral region: Secondary | ICD-10-CM | POA: Diagnosis not present

## 2023-08-18 DIAGNOSIS — M542 Cervicalgia: Secondary | ICD-10-CM | POA: Diagnosis not present

## 2023-08-18 DIAGNOSIS — M2550 Pain in unspecified joint: Secondary | ICD-10-CM | POA: Diagnosis not present

## 2023-08-18 MED ORDER — HYDROCODONE-ACETAMINOPHEN 5-325 MG PO TABS
1.0000 | ORAL_TABLET | Freq: Two times a day (BID) | ORAL | 0 refills | Status: DC | PRN
  Filled 2023-10-12: qty 60, 30d supply, fill #0

## 2023-08-18 MED ORDER — HYDROCODONE-ACETAMINOPHEN 5-325 MG PO TABS
1.0000 | ORAL_TABLET | Freq: Two times a day (BID) | ORAL | 0 refills | Status: DC | PRN
  Filled 2023-09-09: qty 60, 30d supply, fill #0

## 2023-08-23 ENCOUNTER — Other Ambulatory Visit: Payer: Self-pay | Admitting: Internal Medicine

## 2023-08-23 DIAGNOSIS — I2510 Atherosclerotic heart disease of native coronary artery without angina pectoris: Secondary | ICD-10-CM

## 2023-08-23 DIAGNOSIS — I10 Essential (primary) hypertension: Secondary | ICD-10-CM

## 2023-08-24 ENCOUNTER — Other Ambulatory Visit (HOSPITAL_COMMUNITY): Payer: Self-pay

## 2023-08-24 MED ORDER — ENALAPRIL MALEATE 10 MG PO TABS
10.0000 mg | ORAL_TABLET | Freq: Every day | ORAL | 2 refills | Status: DC
  Filled 2023-08-24: qty 90, 90d supply, fill #0
  Filled 2023-11-22: qty 90, 90d supply, fill #1
  Filled 2024-02-19: qty 90, 90d supply, fill #2

## 2023-08-25 ENCOUNTER — Other Ambulatory Visit: Payer: Self-pay | Admitting: Pulmonary Disease

## 2023-08-25 ENCOUNTER — Other Ambulatory Visit (HOSPITAL_COMMUNITY): Payer: Self-pay

## 2023-08-25 MED ORDER — STIOLTO RESPIMAT 2.5-2.5 MCG/ACT IN AERS
2.0000 | INHALATION_SPRAY | Freq: Every day | RESPIRATORY_TRACT | 2 refills | Status: DC
  Filled 2023-08-25: qty 4, 30d supply, fill #0
  Filled 2023-09-27: qty 4, 30d supply, fill #1
  Filled 2023-10-24: qty 4, 30d supply, fill #2

## 2023-09-09 ENCOUNTER — Other Ambulatory Visit (HOSPITAL_COMMUNITY): Payer: Self-pay

## 2023-09-13 NOTE — Progress Notes (Signed)
Patient Care Team: Margaree Mackintosh, MD as PCP - General (Internal Medicine) Tonny Bollman, MD as PCP - Cardiology (Cardiology)  Visit Date: 09/20/23  Subjective:    Patient ID: [name] , Female   DOB: 01/21/1966, 57 y.o.    MRN: [id number]   57 y.o. Female presents today for annual comprehensive physical exam. History of ADHD, anxiety and depression, asthma, coronary artery disease, chronic kidney disease, GERD, GI bleed, Henoch-Schonlein purpura, hypertension, hypothyroidism, migraine headaches, sleep apnea, terminal ileitis.   History of hyperlipidemia treated with atorvastatin 40 mg daily. Lipid panel normal.  History of impaired glucose tolerance that is currently diet and exercise controlled. HGBA1c at 6.0% on 09/19/23, up from 5.9% on 03/14/23.  History of hypertension treated with amlodipine 5 mg daily, enalapril 10 mg daily. Blood pressure normal in-office today at 120/80.  History of anxiety and depression treated with alprazolam 0.5 mg twice daily as needed, Pristiq 50 mg daily.  History of hypothyroidism treated with levothyroxine 75 mcg daily. TSH at 1.03.  In October 2022, she had an E. coli UTI and left hip trochanteric bursitis.  She has a history of coronary artery disease, GE reflux, asthma/COPD, sleep apnea, migraine headaches.   History of chronic musculoskeletal pain for which she sees [facility].   History of open-angle glaucoma seen by Dr. Dione Booze.    History of carpal tunnel syndrome.   She was referred to Pulmonology in 2020 and was seen by Dr. Isaiah Serge.  Was diagnosed with COPD and obstructive sleep apnea.  She had pulmonary functions in 2017 interpreted by Dr. Kendrick Fries and at the time did not show significant obstruction.  She was also evaluated for sleep apnea in 2017 showing mild disease and mild desaturation.  Continues to smoke but says that she does not smoke at work.  She met the screening criteria for low-dose CT given  her smoking history and age.  In February 2022, saw Dr. Tonia Brooms regarding persistent respiratory symptoms that did not resolve.  She had lower lobe opacity on chest x-ray January 2022.  Was found to have left lower lobe pneumonia.  Last CT scan of chest was February 2024 showing Lung-RADS 1 negative, hepatic steatosis, irregular hepatic capsule suspicious for cirrhosis, aortic atherosclerosis, emphysema, age advanced coronary artery atherosclerosis.  She is on atorvastatin 40 mg daily.  She needs follow-up CT scan annually.   Dr. Excell Seltzer is her Cardiologist.  She had an anterior wall MI in December 2013.  Had subtotal occlusion of a very small branch of the diagonal and no other significant disease.  She had cardiac cath and did well.   She had a very serious case of purpura in October 2006.  She developed purpuric lesions on her lower extremities and maroon-colored stools as well as anemia.  CT showed diffuse colitis.  She was transferred to [facility] after developing tea colored urine with hematuria.  She was treated with high-dose steroids which she remained on for several months.  She has never had recurrence.   In September 2009 she was hospitalized for gastroenteritis secondary to C. difficile.  She has a history of migraine headaches.  History of cervical radiculopathy in 2012 and has been evaluated by Dr. Maeola Harman in the remote past.  History of attention deficit disorder but this is not being treated with medication at the present time.  History of endometrial polyp causing menorrhagia removed in 2003.  She had C-sections 1995, 1996 in 1999.  Appendectomy  in the early 1970s.  Chronic fibromyalgia type pain for which she requires chronic pain management at [facility].  Advised on smoking cessation.  Labs are reviewed. Glucose elevated at 113. Kidney, liver functions normal. Electrolytes normal. Blood proteins normal. CBC normal.   Mammogram last completed 11/09/22. No  mammographic evidence of malignancy. Repeat recommended in 2024.  Had colonoscopy in 2016 with 10-year follow-up recommended.   Social history: She is married.  3 adult sons.  Does not consume alcohol.  Father is in poor health and lives nearby and she has to check on him frequently.  She has 2 brothers who help take care of their father some.  Father seems to have some dementia.   Family history: Mother passed away from an acute respiratory arrest.  Both parents with history of hypertension.  Father has had stroke in the remote past.  2 brothers in good health.  No sisters.  Past Medical History:  Diagnosis Date   ADHD (attention deficit hyperactivity disorder)    Anxiety    Asthma    CAD (coronary artery disease)    a. 11/2012 Acute Anterior STEMI/Cath: Nonobs dzs except subtotal occlusion of small, 1mm D2, EF 55-65%;  b. 11/2012  Echo: EF 60-65%, nl wall motion, Gr 2 DD.   Chronic kidney disease    in setting of HSP 2006   Coronary vasospasm (HCC)    a. 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  3 adult sons.  Does not consume alcohol.  Father is in poor health and lives nearby and she has to check on him frequently.  She has 2 brothers who help take care of their father some.  Father seems to have some  dementia.      Review of Systems  Constitutional:  Negative for chills, fever, malaise/fatigue and weight loss.  HENT:  Negative for hearing loss, sinus pain and sore throat.   Respiratory:  Negative for cough, hemoptysis and shortness of breath.   Cardiovascular:  Negative for chest pain, palpitations, leg swelling and PND.  Gastrointestinal:  Negative for abdominal pain, constipation, diarrhea, heartburn, nausea and vomiting.  Genitourinary:  Negative for dysuria, frequency and urgency.  Musculoskeletal:  Negative for back pain, myalgias and neck pain.  Skin:  Negative for itching and rash.  Neurological:  Negative for dizziness, tingling, seizures and headaches.  Endo/Heme/Allergies:  Negative for polydipsia.  Psychiatric/Behavioral:  Negative for depression. The patient is not nervous/anxious.         Objective:   Vitals: BP 120/80   Pulse 62   Ht 5\' 5"  (1.651 m)   Wt 190 lb (86.2 kg)  SpO2 97%   BMI 31.62 kg/m    Physical Exam Vitals and nursing note reviewed.  Constitutional:      General: She is not in acute distress.    Appearance: Normal appearance. She is not ill-appearing or toxic-appearing.  HENT:     Head: Normocephalic and atraumatic.     Right Ear: Hearing, tympanic membrane, ear canal and external ear normal.     Left Ear: Hearing, tympanic membrane, ear canal and external ear normal.     Ears:     Comments: Some cerumen right external ear canal.    Mouth/Throat:     Pharynx: Oropharynx is clear.  Eyes:     Extraocular Movements: Extraocular movements intact.     Pupils: Pupils are equal, round, and reactive to light.  Neck:     Thyroid: No thyroid mass, thyromegaly or thyroid tenderness.     Vascular: No carotid bruit.  Cardiovascular:     Rate and Rhythm: Normal rate and regular rhythm. No extrasystoles are present.    Pulses:          Dorsalis pedis pulses are 1+ on the right side and 1+ on the left side.       Posterior tibial pulses are 1+  on the right side and 1+ on the left side.     Heart sounds: Normal heart sounds. No murmur heard.    No friction rub. No gallop.     Comments: Superficial varicosities lower legs. Pulmonary:     Effort: Pulmonary effort is normal.     Breath sounds: Normal breath sounds. No decreased breath sounds, wheezing, rhonchi or rales.  Chest:     Chest wall: No mass.  Breasts:    Right: No mass.     Left: No mass.  Abdominal:     Palpations: Abdomen is soft. There is no hepatomegaly, splenomegaly or mass.     Tenderness: There is no abdominal tenderness.     Hernia: No hernia is present.  Musculoskeletal:     Cervical back: Normal range of motion.     Right lower leg: No edema.     Left lower leg: No edema.  Lymphadenopathy:     Cervical: No cervical adenopathy.     Upper Body:     Right upper body: No supraclavicular adenopathy.     Left upper body: No supraclavicular adenopathy.  Skin:    General: Skin is warm and dry.  Neurological:     General: No focal deficit present.     Mental Status: She is alert and oriented to person, place, and time. Mental status is at baseline.     Sensory: Sensation is intact.     Motor: Motor function is intact. No weakness.     Deep Tendon Reflexes: Reflexes are normal and symmetric.  Psychiatric:        Attention and Perception: Attention normal.        Mood and Affect: Mood normal.        Speech: Speech normal.        Behavior: Behavior normal.        Thought Content: Thought content normal.        Cognition and Memory: Cognition normal.        Judgment: Judgment normal.       Results:   Studies obtained and personally reviewed by me:  Mammogram last completed 11/09/22. No mammographic evidence of malignancy. Repeat recommended in 2024.  Had colonoscopy in 2016 with 10-year follow-up  recommended.   Labs:       Component Value Date/Time   NA 140 09/19/2023 0921   K 4.4 09/19/2023 0921   CL 102 09/19/2023 0921   CO2 28 09/19/2023  0921   GLUCOSE 113 (H) 09/19/2023 0921   BUN 11 09/19/2023 0921   CREATININE 0.85 09/19/2023 0921   CALCIUM 9.5 09/19/2023 0921   PROT 6.3 09/19/2023 0921   ALBUMIN 3.9 06/21/2017 1014   AST 25 09/19/2023 0921   ALT 25 09/19/2023 0921   ALKPHOS 91 06/21/2017 1014   BILITOT 0.4 09/19/2023 0921   GFRNONAA 63 12/24/2020 1242   GFRAA 73 12/24/2020 1242     Lab Results  Component Value Date   WBC 7.9 09/19/2023   HGB 14.6 09/19/2023   HCT 44.2 09/19/2023   MCV 95.7 09/19/2023   PLT 187 09/19/2023    Lab Results  Component Value Date   CHOL 129 09/19/2023   HDL 55 09/19/2023   LDLCALC 53 09/19/2023   TRIG 129 09/19/2023   CHOLHDL 2.3 09/19/2023    Lab Results  Component Value Date   HGBA1C 6.0 (H) 09/19/2023     Lab Results  Component Value Date   TSH 1.03 09/19/2023      Assessment & Plan:   Hyperlipidemia: treated with atorvastatin 40 mg daily. Lipid panel normal.  Impaired glucose tolerance: currently diet and exercise controlled. HGBA1c at 6.0% on 09/19/23, up from 5.9% on 03/14/23.  Hypertension: treated with amlodipine 5 mg daily, enalapril 10 mg daily. Blood pressure normal in-office today at 120/80.  Anxiety and depression: stable with alprazolam 0.5 mg twice daily as needed, Pristiq 50 mg daily.  Hypothyroidism: treated with levothyroxine 75 mcg daily. TSH at 1.03.  Chronic musculoskeletal pain: taking Norco 5-325 mg twice daily and seen at [facility] pain management.   History of open-angle glaucoma seen by Dr. Dione Booze.    History of carpal tunnel syndrome.   Advised on smoking cessation.  Pelvic exam deferred.  Mammogram last completed 11/09/22. No mammographic evidence of malignancy. Ordered repeat study.  Had colonoscopy in 2016 with 10-year follow-up recommended.   Vaccine counseling: suggested flu, Covid-19 boosters.   Return in 6 months for TSH check and office visit.    I,Alexander Ruley,acting as a Neurosurgeon for Margaree Mackintosh, MD.,have  documented all relevant documentation on the behalf of Margaree Mackintosh, MD,as directed by  Margaree Mackintosh, MD while in the presence of Margaree Mackintosh, MD.   I, Margaree Mackintosh, MD, have reviewed all documentation for this visit. The documentation on 10/05/23 for the exam, diagnosis, procedures, and orders are all accurate and complete.

## 2023-09-19 ENCOUNTER — Other Ambulatory Visit: Payer: Commercial Managed Care - PPO

## 2023-09-19 DIAGNOSIS — Z0000 Encounter for general adult medical examination without abnormal findings: Secondary | ICD-10-CM | POA: Diagnosis not present

## 2023-09-19 DIAGNOSIS — Z8679 Personal history of other diseases of the circulatory system: Secondary | ICD-10-CM | POA: Diagnosis not present

## 2023-09-19 DIAGNOSIS — E039 Hypothyroidism, unspecified: Secondary | ICD-10-CM | POA: Diagnosis not present

## 2023-09-19 DIAGNOSIS — I10 Essential (primary) hypertension: Secondary | ICD-10-CM

## 2023-09-19 DIAGNOSIS — F419 Anxiety disorder, unspecified: Secondary | ICD-10-CM | POA: Diagnosis not present

## 2023-09-19 DIAGNOSIS — F439 Reaction to severe stress, unspecified: Secondary | ICD-10-CM

## 2023-09-19 DIAGNOSIS — F32A Depression, unspecified: Secondary | ICD-10-CM

## 2023-09-19 DIAGNOSIS — R7302 Impaired glucose tolerance (oral): Secondary | ICD-10-CM

## 2023-09-20 ENCOUNTER — Ambulatory Visit (INDEPENDENT_AMBULATORY_CARE_PROVIDER_SITE_OTHER): Payer: Commercial Managed Care - PPO | Admitting: Internal Medicine

## 2023-09-20 ENCOUNTER — Encounter: Payer: Self-pay | Admitting: Internal Medicine

## 2023-09-20 VITALS — BP 120/80 | HR 62 | Ht 65.0 in | Wt 190.0 lb

## 2023-09-20 DIAGNOSIS — E039 Hypothyroidism, unspecified: Secondary | ICD-10-CM

## 2023-09-20 DIAGNOSIS — Z6831 Body mass index (BMI) 31.0-31.9, adult: Secondary | ICD-10-CM

## 2023-09-20 DIAGNOSIS — F17200 Nicotine dependence, unspecified, uncomplicated: Secondary | ICD-10-CM

## 2023-09-20 DIAGNOSIS — G4733 Obstructive sleep apnea (adult) (pediatric): Secondary | ICD-10-CM

## 2023-09-20 DIAGNOSIS — Z23 Encounter for immunization: Secondary | ICD-10-CM

## 2023-09-20 DIAGNOSIS — R7302 Impaired glucose tolerance (oral): Secondary | ICD-10-CM

## 2023-09-20 DIAGNOSIS — Z1239 Encounter for other screening for malignant neoplasm of breast: Secondary | ICD-10-CM

## 2023-09-20 DIAGNOSIS — F439 Reaction to severe stress, unspecified: Secondary | ICD-10-CM

## 2023-09-20 DIAGNOSIS — I10 Essential (primary) hypertension: Secondary | ICD-10-CM

## 2023-09-20 DIAGNOSIS — Z8679 Personal history of other diseases of the circulatory system: Secondary | ICD-10-CM

## 2023-09-20 DIAGNOSIS — I252 Old myocardial infarction: Secondary | ICD-10-CM | POA: Diagnosis not present

## 2023-09-20 DIAGNOSIS — Z0000 Encounter for general adult medical examination without abnormal findings: Secondary | ICD-10-CM

## 2023-09-20 DIAGNOSIS — F419 Anxiety disorder, unspecified: Secondary | ICD-10-CM

## 2023-09-20 DIAGNOSIS — F32A Depression, unspecified: Secondary | ICD-10-CM

## 2023-09-20 LAB — CBC WITH DIFFERENTIAL/PLATELET
Absolute Monocytes: 419 {cells}/uL (ref 200–950)
Basophils Absolute: 24 {cells}/uL (ref 0–200)
Basophils Relative: 0.3 %
Eosinophils Absolute: 261 {cells}/uL (ref 15–500)
Eosinophils Relative: 3.3 %
HCT: 44.2 % (ref 35.0–45.0)
Hemoglobin: 14.6 g/dL (ref 11.7–15.5)
Lymphs Abs: 2591 {cells}/uL (ref 850–3900)
MCH: 31.6 pg (ref 27.0–33.0)
MCHC: 33 g/dL (ref 32.0–36.0)
MCV: 95.7 fL (ref 80.0–100.0)
MPV: 12 fL (ref 7.5–12.5)
Monocytes Relative: 5.3 %
Neutro Abs: 4606 {cells}/uL (ref 1500–7800)
Neutrophils Relative %: 58.3 %
Platelets: 187 10*3/uL (ref 140–400)
RBC: 4.62 10*6/uL (ref 3.80–5.10)
RDW: 11.4 % (ref 11.0–15.0)
Total Lymphocyte: 32.8 %
WBC: 7.9 10*3/uL (ref 3.8–10.8)

## 2023-09-20 LAB — LIPID PANEL
Cholesterol: 129 mg/dL (ref <200)
HDL: 55 mg/dL (ref >=50)
LDL Cholesterol (Calc): 53 mg/dL
Non-HDL Cholesterol (Calc): 74 mg/dL (ref <130)
Total CHOL/HDL Ratio: 2.3 (calc) (ref <5.0)
Triglycerides: 129 mg/dL (ref <150)

## 2023-09-20 LAB — COMPLETE METABOLIC PANEL WITH GFR
AG Ratio: 1.9 (calc) (ref 1.0–2.5)
ALT: 25 U/L (ref 6–29)
AST: 25 U/L (ref 10–35)
Albumin: 4.1 g/dL (ref 3.6–5.1)
Alkaline phosphatase (APISO): 86 U/L (ref 37–153)
BUN: 11 mg/dL (ref 7–25)
CO2: 28 mmol/L (ref 20–32)
Calcium: 9.5 mg/dL (ref 8.6–10.4)
Chloride: 102 mmol/L (ref 98–110)
Creat: 0.85 mg/dL (ref 0.50–1.03)
Globulin: 2.2 g/dL (ref 1.9–3.7)
Glucose, Bld: 113 mg/dL — ABNORMAL HIGH (ref 65–99)
Potassium: 4.4 mmol/L (ref 3.5–5.3)
Sodium: 140 mmol/L (ref 135–146)
Total Bilirubin: 0.4 mg/dL (ref 0.2–1.2)
Total Protein: 6.3 g/dL (ref 6.1–8.1)
eGFR: 79 mL/min/{1.73_m2} (ref >=60)

## 2023-09-20 LAB — HEMOGLOBIN A1C
Hgb A1c MFr Bld: 6 %{Hb} — ABNORMAL HIGH (ref <5.7)
Mean Plasma Glucose: 126 mg/dL
eAG (mmol/L): 7 mmol/L

## 2023-09-20 LAB — TSH: TSH: 1.03 m[IU]/L (ref 0.40–4.50)

## 2023-09-27 ENCOUNTER — Other Ambulatory Visit: Payer: Self-pay | Admitting: Internal Medicine

## 2023-09-28 ENCOUNTER — Other Ambulatory Visit (HOSPITAL_COMMUNITY): Payer: Self-pay

## 2023-09-28 MED ORDER — LEVOTHYROXINE SODIUM 75 MCG PO TABS
75.0000 ug | ORAL_TABLET | Freq: Every day | ORAL | 1 refills | Status: DC
  Filled 2023-09-28: qty 90, 90d supply, fill #0
  Filled 2023-12-26: qty 90, 90d supply, fill #1

## 2023-10-02 ENCOUNTER — Encounter: Payer: Self-pay | Admitting: Internal Medicine

## 2023-10-05 NOTE — Patient Instructions (Addendum)
Patient advised to quit smoking.  Continue current medications.  Labs are stable.  Flu vaccine given.  Mammogram ordered.  Return in 6 months or as needed.  It was a pleasure to see you today as always.

## 2023-10-12 ENCOUNTER — Other Ambulatory Visit (HOSPITAL_COMMUNITY): Payer: Self-pay

## 2023-10-17 ENCOUNTER — Other Ambulatory Visit (HOSPITAL_COMMUNITY): Payer: Self-pay

## 2023-10-17 DIAGNOSIS — M2550 Pain in unspecified joint: Secondary | ICD-10-CM | POA: Diagnosis not present

## 2023-10-17 DIAGNOSIS — M47817 Spondylosis without myelopathy or radiculopathy, lumbosacral region: Secondary | ICD-10-CM | POA: Diagnosis not present

## 2023-10-17 DIAGNOSIS — G894 Chronic pain syndrome: Secondary | ICD-10-CM | POA: Diagnosis not present

## 2023-10-17 DIAGNOSIS — M542 Cervicalgia: Secondary | ICD-10-CM | POA: Diagnosis not present

## 2023-10-17 MED ORDER — HYDROCODONE-ACETAMINOPHEN 5-325 MG PO TABS
1.0000 | ORAL_TABLET | Freq: Two times a day (BID) | ORAL | 0 refills | Status: AC | PRN
Start: 2023-11-09 — End: ?
  Filled 2023-12-09: qty 60, 30d supply, fill #0

## 2023-10-17 MED ORDER — HYDROCODONE-ACETAMINOPHEN 5-325 MG PO TABS
1.0000 | ORAL_TABLET | Freq: Two times a day (BID) | ORAL | 0 refills | Status: DC | PRN
Start: 2023-10-07 — End: 2024-10-24
  Filled 2023-11-10: qty 60, 30d supply, fill #0

## 2023-10-24 ENCOUNTER — Other Ambulatory Visit: Payer: Self-pay | Admitting: Internal Medicine

## 2023-10-25 ENCOUNTER — Other Ambulatory Visit (HOSPITAL_COMMUNITY): Payer: Self-pay

## 2023-10-25 ENCOUNTER — Other Ambulatory Visit: Payer: Self-pay

## 2023-10-25 MED ORDER — ALBUTEROL SULFATE HFA 108 (90 BASE) MCG/ACT IN AERS
2.0000 | INHALATION_SPRAY | Freq: Four times a day (QID) | RESPIRATORY_TRACT | 11 refills | Status: DC | PRN
  Filled 2023-10-25: qty 6.7, 25d supply, fill #0
  Filled 2024-02-09: qty 6.7, 25d supply, fill #1

## 2023-11-10 ENCOUNTER — Other Ambulatory Visit (HOSPITAL_COMMUNITY): Payer: Self-pay

## 2023-11-11 ENCOUNTER — Ambulatory Visit
Admission: RE | Admit: 2023-11-11 | Discharge: 2023-11-11 | Disposition: A | Discharge status: Home / Self Care | Payer: Commercial Managed Care - PPO | Source: Ambulatory Visit | Attending: Internal Medicine | Admitting: Internal Medicine

## 2023-11-11 DIAGNOSIS — Z1231 Encounter for screening mammogram for malignant neoplasm of breast: Secondary | ICD-10-CM | POA: Diagnosis not present

## 2023-11-24 ENCOUNTER — Other Ambulatory Visit: Payer: Self-pay | Admitting: Acute Care

## 2023-11-24 DIAGNOSIS — Z122 Encounter for screening for malignant neoplasm of respiratory organs: Secondary | ICD-10-CM

## 2023-11-24 DIAGNOSIS — Z87891 Personal history of nicotine dependence: Secondary | ICD-10-CM

## 2023-11-24 DIAGNOSIS — F17210 Nicotine dependence, cigarettes, uncomplicated: Secondary | ICD-10-CM

## 2023-12-09 ENCOUNTER — Other Ambulatory Visit (HOSPITAL_COMMUNITY): Payer: Self-pay

## 2023-12-12 ENCOUNTER — Other Ambulatory Visit (HOSPITAL_COMMUNITY): Payer: Self-pay

## 2023-12-12 DIAGNOSIS — M542 Cervicalgia: Secondary | ICD-10-CM | POA: Diagnosis not present

## 2023-12-12 DIAGNOSIS — M2550 Pain in unspecified joint: Secondary | ICD-10-CM | POA: Diagnosis not present

## 2023-12-12 DIAGNOSIS — G894 Chronic pain syndrome: Secondary | ICD-10-CM | POA: Diagnosis not present

## 2023-12-12 DIAGNOSIS — M47817 Spondylosis without myelopathy or radiculopathy, lumbosacral region: Secondary | ICD-10-CM | POA: Diagnosis not present

## 2023-12-12 MED ORDER — HYDROCODONE-ACETAMINOPHEN 5-325 MG PO TABS
1.0000 | ORAL_TABLET | Freq: Two times a day (BID) | ORAL | 0 refills | Status: DC | PRN
Start: 2024-01-10 — End: 2024-10-24
  Filled 2024-03-08: qty 60, 30d supply, fill #0

## 2023-12-12 MED ORDER — HYDROCODONE-ACETAMINOPHEN 5-325 MG PO TABS
1.0000 | ORAL_TABLET | Freq: Two times a day (BID) | ORAL | 0 refills | Status: DC | PRN
Start: 2023-12-12 — End: 2024-10-24
  Filled 2024-01-09: qty 60, 30d supply, fill #0

## 2024-01-09 ENCOUNTER — Other Ambulatory Visit (HOSPITAL_COMMUNITY): Payer: Self-pay

## 2024-01-09 ENCOUNTER — Telehealth: Payer: Self-pay | Admitting: *Deleted

## 2024-01-09 NOTE — Telephone Encounter (Signed)
PT called answ ser saying she waned to resched her LCS on 2/28. Pls call to advise.

## 2024-01-09 NOTE — Telephone Encounter (Signed)
Returned call and call was disconnected. Returned call again and went to VM. CT on 02/03/2024 has been cancelled per patient request. LVM to call and reschedule.

## 2024-01-10 NOTE — Telephone Encounter (Signed)
Spoke with patient. Annual LCS scheduled for 02/07/2024 at [facility] @ 8:30am.

## 2024-01-11 ENCOUNTER — Other Ambulatory Visit: Payer: Self-pay | Admitting: Internal Medicine

## 2024-01-12 ENCOUNTER — Other Ambulatory Visit (HOSPITAL_COMMUNITY): Payer: Self-pay

## 2024-01-12 ENCOUNTER — Other Ambulatory Visit: Payer: Self-pay

## 2024-01-12 MED ORDER — DESVENLAFAXINE SUCCINATE ER 50 MG PO TB24
50.0000 mg | ORAL_TABLET | Freq: Every day | ORAL | 2 refills | Status: DC
  Filled 2024-01-12: qty 90, 90d supply, fill #0
  Filled 2024-04-08: qty 90, 90d supply, fill #1
  Filled 2024-07-06 (×2): qty 90, 90d supply, fill #2

## 2024-01-27 ENCOUNTER — Other Ambulatory Visit (HOSPITAL_COMMUNITY): Payer: Self-pay

## 2024-01-27 ENCOUNTER — Other Ambulatory Visit: Payer: Self-pay | Admitting: Pulmonary Disease

## 2024-01-30 ENCOUNTER — Other Ambulatory Visit (HOSPITAL_COMMUNITY): Payer: Self-pay

## 2024-01-30 ENCOUNTER — Telehealth: Payer: Self-pay

## 2024-01-30 ENCOUNTER — Encounter (HOSPITAL_COMMUNITY): Payer: Self-pay

## 2024-01-30 NOTE — Telephone Encounter (Signed)
 2nd attempt. Called patient and LVM to call office and reschedule cancelled annual CT. Letter also sent to mychart.

## 2024-02-03 ENCOUNTER — Ambulatory Visit (HOSPITAL_BASED_OUTPATIENT_CLINIC_OR_DEPARTMENT_OTHER): Payer: Commercial Managed Care - PPO

## 2024-02-06 ENCOUNTER — Other Ambulatory Visit (HOSPITAL_COMMUNITY): Payer: Self-pay

## 2024-02-06 DIAGNOSIS — M47817 Spondylosis without myelopathy or radiculopathy, lumbosacral region: Secondary | ICD-10-CM | POA: Diagnosis not present

## 2024-02-06 DIAGNOSIS — M2550 Pain in unspecified joint: Secondary | ICD-10-CM | POA: Diagnosis not present

## 2024-02-06 DIAGNOSIS — G894 Chronic pain syndrome: Secondary | ICD-10-CM | POA: Diagnosis not present

## 2024-02-06 DIAGNOSIS — M542 Cervicalgia: Secondary | ICD-10-CM | POA: Diagnosis not present

## 2024-02-06 MED ORDER — HYDROCODONE-ACETAMINOPHEN 5-325 MG PO TABS
1.0000 | ORAL_TABLET | Freq: Two times a day (BID) | ORAL | 0 refills | Status: DC | PRN
  Filled 2024-02-07: qty 60, 30d supply, fill #0

## 2024-02-06 MED ORDER — HYDROCODONE-ACETAMINOPHEN 5-325 MG PO TABS: 1.0000 | ORAL_TABLET | Freq: Two times a day (BID) | ORAL | 0 refills | Status: DC | PRN

## 2024-02-07 ENCOUNTER — Ambulatory Visit (HOSPITAL_BASED_OUTPATIENT_CLINIC_OR_DEPARTMENT_OTHER)
Admission: RE | Admit: 2024-02-07 | Discharge: 2024-02-07 | Disposition: A | Discharge status: Home / Self Care | Payer: Commercial Managed Care - PPO | Source: Ambulatory Visit | Attending: Internal Medicine | Admitting: Internal Medicine

## 2024-02-07 ENCOUNTER — Other Ambulatory Visit: Payer: Self-pay

## 2024-02-07 ENCOUNTER — Other Ambulatory Visit (HOSPITAL_COMMUNITY): Payer: Self-pay

## 2024-02-07 DIAGNOSIS — Z87891 Personal history of nicotine dependence: Secondary | ICD-10-CM | POA: Insufficient documentation

## 2024-02-07 DIAGNOSIS — Z122 Encounter for screening for malignant neoplasm of respiratory organs: Secondary | ICD-10-CM | POA: Diagnosis not present

## 2024-02-07 DIAGNOSIS — F17210 Nicotine dependence, cigarettes, uncomplicated: Secondary | ICD-10-CM | POA: Insufficient documentation

## 2024-02-08 DIAGNOSIS — L821 Other seborrheic keratosis: Secondary | ICD-10-CM | POA: Diagnosis not present

## 2024-02-08 DIAGNOSIS — L853 Xerosis cutis: Secondary | ICD-10-CM | POA: Diagnosis not present

## 2024-02-08 DIAGNOSIS — Z85828 Personal history of other malignant neoplasm of skin: Secondary | ICD-10-CM | POA: Diagnosis not present

## 2024-02-08 DIAGNOSIS — L820 Inflamed seborrheic keratosis: Secondary | ICD-10-CM | POA: Diagnosis not present

## 2024-02-08 DIAGNOSIS — L570 Actinic keratosis: Secondary | ICD-10-CM | POA: Diagnosis not present

## 2024-02-08 DIAGNOSIS — L308 Other specified dermatitis: Secondary | ICD-10-CM | POA: Diagnosis not present

## 2024-02-08 DIAGNOSIS — D485 Neoplasm of uncertain behavior of skin: Secondary | ICD-10-CM | POA: Diagnosis not present

## 2024-02-08 DIAGNOSIS — D225 Melanocytic nevi of trunk: Secondary | ICD-10-CM | POA: Diagnosis not present

## 2024-02-09 ENCOUNTER — Other Ambulatory Visit (HOSPITAL_COMMUNITY): Payer: Self-pay

## 2024-02-23 ENCOUNTER — Other Ambulatory Visit: Payer: Self-pay | Admitting: Pulmonary Disease

## 2024-02-23 ENCOUNTER — Encounter: Payer: Self-pay | Admitting: Internal Medicine

## 2024-02-24 ENCOUNTER — Other Ambulatory Visit (HOSPITAL_COMMUNITY): Payer: Self-pay

## 2024-02-24 MED ORDER — STIOLTO RESPIMAT 2.5-2.5 MCG/ACT IN AERS
2.0000 | INHALATION_SPRAY | Freq: Every day | RESPIRATORY_TRACT | 9 refills | Status: DC
  Filled 2024-02-24: qty 4, 30d supply, fill #0
  Filled 2024-03-27: qty 4, 30d supply, fill #1
  Filled 2024-04-22: qty 4, 30d supply, fill #2

## 2024-02-24 NOTE — Telephone Encounter (Signed)
 Called patient and gave her several options.

## 2024-03-01 DIAGNOSIS — Z6831 Body mass index (BMI) 31.0-31.9, adult: Secondary | ICD-10-CM | POA: Diagnosis not present

## 2024-03-01 DIAGNOSIS — N952 Postmenopausal atrophic vaginitis: Secondary | ICD-10-CM | POA: Diagnosis not present

## 2024-03-01 DIAGNOSIS — Z01419 Encounter for gynecological examination (general) (routine) without abnormal findings: Secondary | ICD-10-CM | POA: Diagnosis not present

## 2024-03-05 ENCOUNTER — Other Ambulatory Visit: Payer: Self-pay | Admitting: Acute Care

## 2024-03-05 DIAGNOSIS — F17210 Nicotine dependence, cigarettes, uncomplicated: Secondary | ICD-10-CM

## 2024-03-05 DIAGNOSIS — Z87891 Personal history of nicotine dependence: Secondary | ICD-10-CM

## 2024-03-05 DIAGNOSIS — Z122 Encounter for screening for malignant neoplasm of respiratory organs: Secondary | ICD-10-CM

## 2024-03-08 ENCOUNTER — Other Ambulatory Visit (HOSPITAL_COMMUNITY): Payer: Self-pay

## 2024-03-13 ENCOUNTER — Ambulatory Visit: Payer: Commercial Managed Care - PPO | Admitting: Cardiovascular Disease

## 2024-03-13 ENCOUNTER — Ambulatory Visit: Admitting: Cardiology

## 2024-03-19 ENCOUNTER — Other Ambulatory Visit: Payer: Commercial Managed Care - PPO

## 2024-03-19 DIAGNOSIS — E039 Hypothyroidism, unspecified: Secondary | ICD-10-CM

## 2024-03-19 DIAGNOSIS — I10 Essential (primary) hypertension: Secondary | ICD-10-CM

## 2024-03-19 DIAGNOSIS — R7302 Impaired glucose tolerance (oral): Secondary | ICD-10-CM

## 2024-03-19 NOTE — Progress Notes (Signed)
 Patient Care Team: Margaree Mackintosh, MD as PCP - General (Internal Medicine) Tonny Bollman, MD as PCP - Cardiology (Cardiology)  Visit Date: 03/20/24  Subjective:   Chief Complaint  Patient presents with   Follow-up   Vitals:   03/20/24 0929  BP: (!) 128/90  Patient ZO:XWRUEAVW Adria Devon DOB:Feb 22, 1966,58 y.o. UJW:[id number]   58 y.o. Female presents today for 6 months follow-up for Hypertension; Hyperlipidemia; Impaired Glucose Tolerance; Hypothyroidism. Patient has a past medical history of GERD; Anxiety/Depression; Sleep Apnea; COPD; Asthma; Current Smoker; BMI >30. Seen 09/20/2023 for her annual visit, in the interim has seen Dr. Manon Hilding with [facility] in January 2025 and saw her gynecologist, Dr. Hulan Fess, on 03/01/2024 for her annual gyn exam.   History of Hypertension; Coronary Artery Disease; Anterior Wall MI in 2013 treated with Amlodipine 5 mg daily and Enalapril 10 mg daily. Blood Pressure: hypertensive today at 128/90. In 2013 cardiac cath demonstrated subtotal occlusion of a small diagonal branch w/ patency of LAD and no other obstructive coronary disease. Followed by Cardiologist, Dr. Excell Seltzer, who she last saw on 03/16/2024 and is due for a follow-up. EKG 03/2023 with normal sinus rhythm, biatrial enlargement, and low voltage QRS; echocardiogram essentially normal according to Dr. Excell Seltzer.   History of Hyperlipidemia treated with  Atorvastatin 40 mg daily. 03/19/2024 Lipid Panel with Triglycerides 152, elevated from 124 in 03/2023.   History of Impaired Glucose Tolerance w/ 03/19/2024 HgbA1c 5.9, no change from 03/2023, decreased from 6.0 in 09/2023.   Reviewed 03/19/2024 Hepatic Function Panel: WNL.  History of Hypothyroidism treated with Levothyroxine 75 mcg. 03/19/2024 TSH  0.93.   History of GERD treated with Protonix 40 mg twice daily.  History of Anxiety/Depression; Situational Stress due to work treated with Pristiq 50 mg daily and Xanax 0.5 mg as  needed  History of Sleep Apnea; COPD treated with Stiolto Respimate inhaler; Asthma treated with Albuterol inhaler; Smoking with 02/07/2024 Lung Cancer Screening was negative with findings of three-vessel coronary artery calcification and emphysema. Was previously being followed by Dr. Tonia Brooms, but has been looking for a new pulmonologist since his departure.   History of BMI >30, today 31.12 with weight 187 pounds. Past Medical History:  Diagnosis Date   ADHD (attention deficit hyperactivity disorder)    Anxiety    Asthma    CAD (coronary artery disease)    a. 11/2012 Acute Anterior STEMI/Cath: Nonobs dzs except subtotal occlusion of small, 1mm D2, EF 55-65%;  b. 11/2012  Echo: EF 60-65%, nl wall motion, Gr 2 DD.   Chronic kidney disease    in setting of HSP 2006   Coronary vasospasm (HCC)    a. 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  3 adult sons.  Does not consume alcohol.  Father is in poor health and lives nearby and she has to check on him frequently.  She has 2 brothers who help take care of their father some.  Father  seems to have some dementia.  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  HENT:  Negative for congestion.   Eyes:  Negative for blurred vision.  Respiratory:  Negative for cough and shortness of breath.   Cardiovascular:  Negative for chest pain, palpitations and leg swelling.  Gastrointestinal:  Negative for vomiting.  Musculoskeletal:  Negative for back pain.  Skin:  Negative for rash.  Neurological:  Negative for loss of consciousness and headaches.     Objective:  Vitals: BP (!) 128/90   Pulse 84   Temp 98.7 F (37.1 C)   Ht 5\' 5"  (1.651 m)   Wt 187 lb (84.8 kg)   SpO2 96%   BMI 31.12 kg/m   Physical Exam Vitals and nursing note reviewed.  Constitutional:      General: She is not in acute distress.    Appearance: Normal appearance. She is not toxic-appearing.  HENT:     Head: Normocephalic and atraumatic.  Pulmonary:     Effort: Pulmonary effort is normal.  Skin:    General: Skin is warm and dry.  Neurological:     Mental Status: She is alert and oriented to person, place, and time. Mental status is at baseline.  Psychiatric:        Mood and Affect: Mood normal.        Behavior: Behavior normal.        Thought Content: Thought content normal.        Judgment: Judgment normal.     Results:  Studies Obtained And Personally Reviewed By Me: Labs:     Component Value Date/Time   NA 140 09/19/2023 0921   K 4.4 09/19/2023 0921   CL 102 09/19/2023 0921   CO2 28 09/19/2023 0921   GLUCOSE 113 (H) 09/19/2023 0921   BUN 11 09/19/2023 0921   CREATININE 0.85 09/19/2023 0921   CALCIUM 9.5 09/19/2023 0921   PROT 6.6 03/19/2024 0959   ALBUMIN 3.9 06/21/2017 1014   AST 26 03/19/2024 0959   ALT 24 03/19/2024 0959   ALKPHOS 91 06/21/2017 1014   BILITOT 0.5 03/19/2024 0959   GFRNONAA 63 12/24/2020 1242   GFRAA 73 12/24/2020 1242    Lab Results  Component Value Date   WBC 7.9 09/19/2023    HGB 14.6 09/19/2023   HCT 44.2 09/19/2023   MCV 95.7 09/19/2023   PLT 187 09/19/2023   Lab Results  Component Value Date   CHOL 149 03/19/2024   HDL 56 03/19/2024   LDLCALC 70 03/19/2024   TRIG 152 (H) 03/19/2024   CHOLHDL 2.7 03/19/2024   Lab Results  Component Value Date   HGBA1C 5.9 (H) 03/19/2024    Lab Results  Component Value Date   TSH 0.93 03/19/2024   Assessment & Plan:   Hypertension; Coronary Artery Disease; Anterior Wall MI in 2013 treated with Amlodipine 5 mg daily and Enalapril 10  mg daily. Blood Pressure: hypertensive today at 128/90. In 2013 cardiac cath demonstrated subtotal occlusion of a small diagonal branch w/ patency of LAD and no other obstructive coronary disease. Followed by Cardiologist, Dr. Arlester Ladd, who she last saw on 03/16/2024 and is due for a follow-up. EKG 03/2023 with normal sinus rhythm, biatrial enlargement, and low voltage QRS; echocardiogram essentially normal according to Dr. Arlester Ladd.   Hyperlipidemia treated with  Atorvastatin 40 mg daily. 03/19/2024 Lipid Panel with Triglycerides 152, elevated from 124 in 03/2023.   Impaired Glucose Tolerance w/ 03/19/2024 HgbA1c 5.9, no change from 03/2023, decreased from 6.0 in 09/2023.   Reviewed 03/19/2024 Hepatic Function Panel: WNL.  Hypothyroidism treated with Levothyroxine 75 mcg. 03/19/2024 TSH  0.93.   GERD treated with Protonix 40 mg twice daily.  Anxiety/Depression; Situational Stress due to work treated with Pristiq 50 mg daily and Xanax 0.5 mg as needed.  Sleep Apnea; COPD treated with Stiolto Respimate inhaler; Asthma treated with Albuterol inhaler; Smoking with 02/07/2024 Lung Cancer Screening was negative with findings of three-vessel coronary artery calcification and emphysema. Was previously being followed by Dr. Thelda Finney, but has been looking for a new pulmonologist since his departure.   BMI >30, today 31.12 with weight 187 pounds.    I,Emily Lagle,acting as a Neurosurgeon for Sylvan Evener, MD.,have  documented all relevant documentation on the behalf of Sylvan Evener, MD,as directed by  Sylvan Evener, MD while in the presence of Sylvan Evener, MD.   I, Sylvan Evener, MD, have reviewed all documentation for this visit. The documentation on 03/20/24 for the exam, diagnosis, procedures, and orders are all accurate and complete.

## 2024-03-20 ENCOUNTER — Encounter: Payer: Self-pay | Admitting: Internal Medicine

## 2024-03-20 ENCOUNTER — Ambulatory Visit: Payer: Commercial Managed Care - PPO | Admitting: Internal Medicine

## 2024-03-20 VITALS — BP 128/90 | HR 84 | Temp 98.7°F | Ht 65.0 in | Wt 187.0 lb

## 2024-03-20 DIAGNOSIS — F439 Reaction to severe stress, unspecified: Secondary | ICD-10-CM | POA: Diagnosis not present

## 2024-03-20 DIAGNOSIS — R7302 Impaired glucose tolerance (oral): Secondary | ICD-10-CM

## 2024-03-20 DIAGNOSIS — I10 Essential (primary) hypertension: Secondary | ICD-10-CM | POA: Diagnosis not present

## 2024-03-20 DIAGNOSIS — E039 Hypothyroidism, unspecified: Secondary | ICD-10-CM

## 2024-03-20 DIAGNOSIS — G4733 Obstructive sleep apnea (adult) (pediatric): Secondary | ICD-10-CM | POA: Diagnosis not present

## 2024-03-20 DIAGNOSIS — Z8679 Personal history of other diseases of the circulatory system: Secondary | ICD-10-CM

## 2024-03-20 DIAGNOSIS — F17200 Nicotine dependence, unspecified, uncomplicated: Secondary | ICD-10-CM | POA: Diagnosis not present

## 2024-03-20 DIAGNOSIS — I252 Old myocardial infarction: Secondary | ICD-10-CM

## 2024-03-20 DIAGNOSIS — F32A Depression, unspecified: Secondary | ICD-10-CM

## 2024-03-20 DIAGNOSIS — F419 Anxiety disorder, unspecified: Secondary | ICD-10-CM

## 2024-03-20 LAB — HEMOGLOBIN A1C
Hgb A1c MFr Bld: 5.9 % — ABNORMAL HIGH (ref <5.7)
Mean Plasma Glucose: 123 mg/dL
eAG (mmol/L): 6.8 mmol/L

## 2024-03-20 LAB — HEPATIC FUNCTION PANEL
AG Ratio: 1.9 (calc) (ref 1.0–2.5)
ALT: 24 U/L (ref 6–29)
AST: 26 U/L (ref 10–35)
Albumin: 4.3 g/dL (ref 3.6–5.1)
Alkaline phosphatase (APISO): 85 U/L (ref 37–153)
Bilirubin, Direct: 0.1 mg/dL (ref 0.0–0.2)
Globulin: 2.3 g/dL (ref 1.9–3.7)
Indirect Bilirubin: 0.4 mg/dL (ref 0.2–1.2)
Total Bilirubin: 0.5 mg/dL (ref 0.2–1.2)
Total Protein: 6.6 g/dL (ref 6.1–8.1)

## 2024-03-20 LAB — LIPID PANEL
Cholesterol: 149 mg/dL (ref <200)
HDL: 56 mg/dL (ref >=50)
LDL Cholesterol (Calc): 70 mg/dL
Non-HDL Cholesterol (Calc): 93 mg/dL (ref <130)
Total CHOL/HDL Ratio: 2.7 (calc) (ref <5.0)
Triglycerides: 152 mg/dL — ABNORMAL HIGH (ref <150)

## 2024-03-20 LAB — TSH: TSH: 0.93 m[IU]/L (ref 0.40–4.50)

## 2024-03-20 NOTE — Patient Instructions (Signed)
It was a pleasure to see you today.  Continue current medications and follow-up in 6 months. 

## 2024-03-21 ENCOUNTER — Telehealth: Payer: Self-pay

## 2024-03-21 NOTE — Telephone Encounter (Signed)
 Copied from CRM [id number]. Topic: Appointments - Transfer of Care >> Mar 19, 2024  4:19 PM [name] wrote: Pt is requesting to transfer FROM: Dr. Thelda Finney Pt is requesting to transfer TO: parret, [name] Reason for requested transfer: original doctor no longer here at office It is the responsibility of the team the patient would like to transfer to ([name]) to reach out to the patient if for any reason this transfer is not acceptable.  Spoke with Patient regarding prior message . Patient is scheduled to see [name] on 05/08/2024 Patient'Lam provider AmyIcard is no longer with our office .   Patient'Lam voice was understanding.Nothing else further needed.

## 2024-03-25 NOTE — Progress Notes (Signed)
 Cardiology Office Note    Date:  03/26/2024  ID:  [name], DOB 08/24/1966, MRN [id number] PCP:  Sylvan Evener, MD  Cardiologist:  Arnoldo Lapping, MD  Electrophysiologist:  None   Chief Complaint: Follow up for CAD and palpitations   History of Present Illness: [name] is a 58 y.o. female with visit-pertinent history of CAD, possible coronary vasospasm, hypertension, hypothyroidism and GERD.  Patient initially presented in 11/2012 with an acute anterior wall MI.  Cardiac catheterization showed subtotal occlusion of the very small diagonal branch with patency of the LAD and no other significant CAD.  LV function was within normal limits.  There was suspicion for a component of coronary vasospasm.  She was treated medically.  In 2018 she noted increase frequency of palpitations.  Holter monitor was ordered which showed sinus rhythm with periods of sinus bradycardia and sinus tachycardia as well as single PVCs and PACs but no sustained arrhythmia or pathologic pauses.  Patient reported palpitations occurred at rest and stated related to stress at work.  She worked on reducing triggers such as reducing caffeine and stress related to work and the palpitations improved.  Addition of beta-blocker was discussed but heart rate was in the 60s and BP was on the low side so was not added at that time.  Holter monitor in 02/2021 indicated predominant rhythm was sinus rhythm average heart rate of 80 bpm, no atrial fibrillation or flutter, no high-grade heart block or pathologic pauses, rare PVCs and occasional supraventricular beats without sustained arrhythmias.  Patient was last seen by Dr. Arlester Ladd on 03/17/2023, continued to note problems with heart palpitations.  Otherwise she had no cardiac complaints.  Echocardiogram on 04/07/2023 indicated LVEF of 60 to 65%, no RWMA, diastolic parameters were normal, RV systolic function and size was normal, no significant valvular  abnormalities.  Today she presents for follow-up.  She reports that she is overall doing okay, continues to have episodes of palpitations that occur once or twice a month.  She does have a Kardia mobile device, unfortunately has been unable to capture any recent episodes.  She notes that yesterday she was having palpitations, felt as though her heart rate was fast, occurred while she was at work.  She does note some correlation with anxiety at work and increased palpitations.  She will continue to try to monitor with her Kardia mobile device.  She denies any chest pain, shortness of breath, worsening lower extremity edema, orthopnea or PND.  Denies any presyncope or syncope.  She notes she continues to smoke half a pack of cigarettes a day.  Labwork independently reviewed: 09/19/2023: Sodium 140, potassium 4.4, creatinine 0.85, hemoglobin 14.6, hematocrit 44.2 03/19/24: AST 26, ALT 24, total cholesterol 149, HDL 56, triglycerides 152 and LDL 70. ROS: .   Today she denies chest pain, shortness of breath, lower extremity edema, fatigue, melena, hematuria, hemoptysis, diaphoresis, weakness, presyncope, syncope, orthopnea, and PND.  All other systems are reviewed and otherwise negative. Studies Reviewed: Aaron Aas    EKG:  EKG is ordered today, personally reviewed, demonstrating  EKG Interpretation Date/Time:  Monday March 26 2024 09:07:00 EDT Ventricular Rate:  77 PR Interval:  190 QRS Duration:  100 QT Interval:  390 QTC Calculation: 441 R Axis:   58  Text Interpretation: Normal sinus rhythm Normal ECG Confirmed by Kamaron Deskins [phone]) on 03/26/2024 9:11:10 AM   CV Studies: Cardiac studies reviewed are outlined and summarized above. Otherwise please  see EMR for full report. Cardiac Studies & Procedures   ______________________________________________________________________________________________     ECHOCARDIOGRAM  ECHOCARDIOGRAM COMPLETE 04/07/2023  Narrative ECHOCARDIOGRAM  REPORT    Patient Name:   [name] Date of Exam: 04/07/2023 Medical Rec #:  [id number]                   Height:       65.0 in Accession #:    [id number]                  Weight:       191.4 lb Date of Birth:  27-Dec-1965                   BSA:          1.942 m Patient Age:    56 years                    BP:           120/80 mmHg Patient Gender: F                           HR:           75 bpm. Exam Location:  [address]  Procedure: 2D Echo, 3D Echo, Cardiac Doppler, Color Doppler and Strain Analysis  Indications:    R00.2 Palpitations  History:        Patient has prior history of Echocardiogram examinations, most recent 11/23/2012. Previous Myocardial Infarction and CAD; Risk Factors:Hypertension and Current Smoker.  Sonographer:    Cheri Coria Rodgers-Jones RDCS Referring Phys: [id number] MICHAEL COOPER  IMPRESSIONS   1. Left ventricular ejection fraction, by estimation, is 60 to 65%. The left ventricle has normal function. The left ventricle has no regional wall motion abnormalities. Left ventricular diastolic parameters were normal. The average left ventricular global longitudinal strain is -23.9 %. The global longitudinal strain is normal. 2. Right ventricular systolic function is normal. The right ventricular size is normal. 3. The mitral valve is normal in structure. No evidence of mitral valve regurgitation. No evidence of mitral stenosis. 4. The aortic valve is tricuspid. Aortic valve regurgitation is not visualized. No aortic stenosis is present. 5. The inferior vena cava is normal in size with greater than 50% respiratory variability, suggesting right atrial pressure of 3 mmHg.  FINDINGS Left Ventricle: Left ventricular ejection fraction, by estimation, is 60 to 65%. The left ventricle has normal function. The left ventricle has no regional wall motion abnormalities. The average left ventricular global longitudinal strain is -23.9 %. The global longitudinal  strain is normal. The left ventricular internal cavity size was normal in size. There is no left ventricular hypertrophy. Left ventricular diastolic parameters were normal. Indeterminate filling pressures.  Right Ventricle: The right ventricular size is normal. No increase in right ventricular wall thickness. Right ventricular systolic function is normal.  Left Atrium: Left atrial size was normal in size.  Right Atrium: Right atrial size was normal in size.  Pericardium: There is no evidence of pericardial effusion.  Mitral Valve: The mitral valve is normal in structure. No evidence of mitral valve regurgitation. No evidence of mitral valve stenosis.  Tricuspid Valve: The tricuspid valve is normal in structure. Tricuspid valve regurgitation is not demonstrated. No evidence of tricuspid stenosis.  Aortic Valve: The aortic valve is tricuspid. Aortic valve regurgitation is not visualized. No aortic stenosis is present.  Pulmonic Valve: The pulmonic  valve was normal in structure. Pulmonic valve regurgitation is not visualized. No evidence of pulmonic stenosis.  Aorta: The aortic root is normal in size and structure.  Venous: The inferior vena cava is normal in size with greater than 50% respiratory variability, suggesting right atrial pressure of 3 mmHg.  IAS/Shunts: No atrial level shunt detected by color flow Doppler.   LEFT VENTRICLE PLAX 2D LVIDd:         4.20 cm   Diastology LVIDs:         2.60 cm   LV e' medial:    9.94 cm/s LV PW:         0.80 cm   LV E/e' medial:  9.9 LV IVS:        0.80 cm   LV e' lateral:   11.85 cm/s LVOT diam:     2.00 cm   LV E/e' lateral: 8.3 LV SV:         88 LV SV Index:   46        2D Longitudinal Strain LVOT Area:     3.14 cm  2D Strain GLS (A2C):   -22.2 % 2D Strain GLS (A3C):   -23.7 % 2D Strain GLS (A4C):   -25.7 % 2D Strain GLS Avg:     -23.9 %  3D Volume EF: 3D EF:        55 % LV EDV:       142 ml LV ESV:       63 ml LV SV:        79  ml  RIGHT VENTRICLE             IVC RV Basal diam:  3.50 cm     IVC diam: 1.20 cm RV S prime:     14.73 cm/s TAPSE (M-mode): 2.4 cm  LEFT ATRIUM             Index        RIGHT ATRIUM          Index LA diam:        3.50 cm 1.80 cm/m   RA Area:     7.85 cm LA Vol (A2C):   31.9 ml 16.43 ml/m  RA Volume:   15.40 ml 7.93 ml/m LA Vol (A4C):   26.8 ml 13.80 ml/m LA Biplane Vol: 30.2 ml 15.55 ml/m AORTIC VALVE LVOT Vmax:   145.00 cm/s LVOT Vmean:  95.633 cm/s LVOT VTI:    0.282 m  AORTA Ao Root diam: 3.00 cm  MITRAL VALVE MV Area (PHT): 4.06 cm    SHUNTS MV Decel Time: 187 msec    Systemic VTI:  0.28 m MV E velocity: 98.50 cm/s  Systemic Diam: 2.00 cm MV A velocity: 79.90 cm/s MV E/A ratio:  1.23  Maudine Sos MD Electronically signed by Maudine Sos MD Signature Date/Time: 04/08/2023/10:59:00 AM    Final    MONITORS  CARDIAC EVENT MONITOR 03/05/2021  Narrative 1. The basic rhythm is normal sinus with an average HR of 80 bpm 2. No atrial fibrillation or flutter 3. No high-grade heart block or pathologic pauses 4. There are rare PVC's and occasional supraventricular beats without sustained arrhythmias       ______________________________________________________________________________________________       Current Reported Medications:.    Current Meds  Medication Sig   albuterol  (VENTOLIN  HFA) 108 (90 Base) MCG/ACT inhaler Inhale 2 puffs into the lungs every 6 (six) hours as needed.   ALPRAZolam  (XANAX ) 0.5 MG  tablet Take 1 tablet (0.5 mg total) by mouth 2 (two) times daily as needed for anxiety.   amLODipine  (NORVASC ) 5 MG tablet Take 1 tablet (5 mg total) by mouth daily.   aspirin  EC 81 MG EC tablet Take 1 tablet (81 mg total) by mouth daily.   atorvastatin  (LIPITOR) 40 MG tablet Take 1 tablet (40 mg total) by mouth daily at 6 PM.   calcium -vitamin D  (OSCAL WITH D) 500-200 MG-UNIT per tablet Take 1 tablet by mouth daily.   Cholecalciferol (VITAMIN  D3) 2000 units TABS Take 2,000 Units by mouth daily.   desvenlafaxine  (PRISTIQ ) 50 MG 24 hr tablet Take 1 tablet (50 mg total) by mouth daily.   enalapril  (VASOTEC ) 10 MG tablet Take 1 tablet (10 mg total) by mouth daily.   HYDROcodone -acetaminophen  (NORCO/VICODIN) 5-325 MG tablet Take 1 tablet by mouth twice a day as needed for pain   ketoconazole  (NIZORAL ) 2 % cream Apply 1 application topically daily.   levothyroxine  (SYNTHROID ) 75 MCG tablet Take 1 tablet (75 mcg total) by mouth daily.   loratadine (CLARITIN) 10 MG tablet Take 10 mg by mouth daily.   naloxone  (NARCAN ) nasal spray 4 mg/0.1 mL 1 spray into one nostril as a single dose as needed may repeat dose every 2-3 min until patient responsive or EMS arrives   nitroGLYCERIN  (NITROSTAT ) 0.4 MG SL tablet Dissolve 1 tablet under the tongue every 5 minutes as needed for chest pain. Max of 3 doses, then 911.   pantoprazole  (PROTONIX ) 40 MG tablet Take 1 tablet (40 mg total) by mouth 2 (two) times daily before a meal.   Tiotropium Bromide-Olodaterol (STIOLTO RESPIMAT ) 2.5-2.5 MCG/ACT AERS Inhale 2 puffs into the lungs daily.   tiZANidine  (ZANAFLEX ) 2 MG tablet Take 1 tablet (2 mg total) by mouth 3 (three) times daily as needed.   Physical Exam:    VS:  BP 118/74 (BP Location: Right Arm, Patient Position: Sitting, Cuff Size: Normal)   Pulse 77   Ht 5\' 5"  (1.651 m)   Wt 189 lb (85.7 kg)   SpO2 95%   BMI 31.45 kg/m    Wt Readings from Last 3 Encounters:  03/26/24 189 lb (85.7 kg)  03/20/24 187 lb (84.8 kg)  09/20/23 190 lb (86.2 kg)    GEN: Well nourished, well developed in no acute distress NECK: No JVD; No carotid bruits CARDIAC: RRR, no murmurs, rubs, gallops RESPIRATORY:  Clear to auscultation without rales, wheezing or rhonchi  ABDOMEN: Soft, non-tender, non-distended EXTREMITIES:  No edema; No acute deformity     Asessement and Plan:.    CAD: S/p acute anterior MI in 11/2012.  Cardiac cath showed subtotal occlusion of the very  small diagonal branch with patency of the LAD and no other significant CAD.  LV function was within normal limits.  There were suspicion for a component of coronary spasm. Stable with no anginal symptoms. No indication for ischemic evaluation.  Heart healthy diet and regular cardiovascular exercise encouraged.  Continue amlodipine  5 mg daily, aspirin  81 mg daily, Lipitor 40 mg daily, enalapril  10 mg daily.  Hypertension: Blood pressure today 118/74. Continue current antihypertensive regimen.   Palpitations: Holter monitor in 2018 showed sinus rhythm with periods of sinus bradycardia and sinus tachycardia as well as single PVCs and PACs but no sustained arrhythmia or pathologic pauses.  Repeat cardiac monitor in 2022 was again reassuring.  Today she reports ongoing intermittent palpitations that typically occur once or twice a month and can last a few  hours, typically associated with work.  She does have a Kardia mobile, has been unable to catch any recent episodes.  Will defer repeat cardiac monitor given episodes only occur once or twice a month, encouraged to continue monitoring with Kardia mobile device.  Sleep apnea: Patient reports she has undergone sleep studies before, does not currently use CPAP device.  Tobacco abuse: Patient reports that she currently smokes 1/2 a pack a day.  Complete cessation encouraged.  Hyperlipidemia: Last lipid provide on 03/19/2024 indicated total cholesterol 149, HDL 56, triglycerides 152 and LDL 70.  Continue Lipitor 40 mg daily.    Disposition: F/u with Dr. Arlester Ladd or Hatsuko Bizzarro, NP in one year or sooner if needed.   Signed, Holger Sokolowski D Bellina Tokarczyk, NP

## 2024-03-26 ENCOUNTER — Ambulatory Visit: Attending: Cardiology | Admitting: Cardiology

## 2024-03-26 ENCOUNTER — Encounter: Payer: Self-pay | Admitting: Cardiology

## 2024-03-26 VITALS — BP 118/74 | HR 77 | Ht 65.0 in | Wt 189.0 lb

## 2024-03-26 DIAGNOSIS — I2510 Atherosclerotic heart disease of native coronary artery without angina pectoris: Secondary | ICD-10-CM | POA: Diagnosis not present

## 2024-03-26 DIAGNOSIS — R002 Palpitations: Secondary | ICD-10-CM | POA: Diagnosis not present

## 2024-03-26 DIAGNOSIS — E782 Mixed hyperlipidemia: Secondary | ICD-10-CM | POA: Diagnosis not present

## 2024-03-26 DIAGNOSIS — I10 Essential (primary) hypertension: Secondary | ICD-10-CM | POA: Diagnosis not present

## 2024-03-26 DIAGNOSIS — Z720 Tobacco use: Secondary | ICD-10-CM

## 2024-03-26 NOTE — Patient Instructions (Signed)
 Medication Instructions:  No changes *If you need a refill on your cardiac medications before your next appointment, please call your pharmacy*  Lab Work: No labs If you have labs (blood work) drawn today and your tests are completely normal, you will receive your results only by: MyChart Message (if you have MyChart) OR A paper copy in the mail If you have any lab test that is abnormal or we need to change your treatment, we will call you to review the results.  Testing/Procedures: No testing  Follow-Up: At [facility], you and your health needs are our priority.  As part of our continuing mission to provide you with exceptional heart care, our providers are all part of one team.  This team includes your primary Cardiologist (physician) and Advanced Practice Providers or APPs (Physician Assistants and Nurse Practitioners) who all work together to provide you with the care you need, when you need it.  Your next appointment:   1 year(s)  Provider:   Arnoldo Lapping, MD   We recommend signing up for the patient portal called "MyChart".  Sign up information is provided on this After Visit Summary.  MyChart is used to connect with patients for Virtual Visits (Telemedicine).  Patients are able to view lab/test results, encounter notes, upcoming appointments, etc.  Non-urgent messages can be sent to your provider as well.   To learn more about what you can do with MyChart, go to [email or web address].   Other Instructions:   1st Floor: - Lobby - Registration  - Pharmacy  - Lab - Cafe  2nd Floor: - PV Lab - Diagnostic Testing (echo, CT, nuclear med)  3rd Floor: - Vacant  4th Floor: - TCTS (cardiothoracic surgery) - AFib Clinic - Structural Heart Clinic - Vascular Surgery  - Vascular Ultrasound  5th Floor: - [facility] Card[facility] (general and EP) - Clinical Pharmacy for coumadin, hypertension, lipid, weight-loss medications, and med management  appointments    Valet parking services will be available as well.

## 2024-03-27 ENCOUNTER — Other Ambulatory Visit: Payer: Self-pay

## 2024-03-27 ENCOUNTER — Other Ambulatory Visit (HOSPITAL_COMMUNITY): Payer: Self-pay

## 2024-03-27 ENCOUNTER — Other Ambulatory Visit: Payer: Self-pay | Admitting: Internal Medicine

## 2024-03-27 DIAGNOSIS — E039 Hypothyroidism, unspecified: Secondary | ICD-10-CM

## 2024-03-27 MED ORDER — LEVOTHYROXINE SODIUM 75 MCG PO TABS
75.0000 ug | ORAL_TABLET | Freq: Every day | ORAL | 1 refills | Status: DC
  Filled 2024-03-27: qty 90, 90d supply, fill #0
  Filled 2024-06-27: qty 90, 90d supply, fill #1

## 2024-04-08 ENCOUNTER — Other Ambulatory Visit: Payer: Self-pay | Admitting: Cardiovascular Disease

## 2024-04-08 DIAGNOSIS — I2510 Atherosclerotic heart disease of native coronary artery without angina pectoris: Secondary | ICD-10-CM

## 2024-04-08 DIAGNOSIS — I10 Essential (primary) hypertension: Secondary | ICD-10-CM

## 2024-04-09 ENCOUNTER — Other Ambulatory Visit (HOSPITAL_COMMUNITY): Payer: Self-pay

## 2024-04-09 ENCOUNTER — Other Ambulatory Visit: Payer: Self-pay

## 2024-04-09 DIAGNOSIS — M542 Cervicalgia: Secondary | ICD-10-CM | POA: Diagnosis not present

## 2024-04-09 DIAGNOSIS — M47817 Spondylosis without myelopathy or radiculopathy, lumbosacral region: Secondary | ICD-10-CM | POA: Diagnosis not present

## 2024-04-09 DIAGNOSIS — G894 Chronic pain syndrome: Secondary | ICD-10-CM | POA: Diagnosis not present

## 2024-04-09 DIAGNOSIS — M2550 Pain in unspecified joint: Secondary | ICD-10-CM | POA: Diagnosis not present

## 2024-04-09 MED ORDER — HYDROCODONE-ACETAMINOPHEN 5-325 MG PO TABS
1.0000 | ORAL_TABLET | Freq: Two times a day (BID) | ORAL | 0 refills | Status: DC | PRN
  Filled 2024-04-09: qty 60, 30d supply, fill #0

## 2024-04-09 MED ORDER — HYDROCODONE-ACETAMINOPHEN 5-325 MG PO TABS
1.0000 | ORAL_TABLET | Freq: Two times a day (BID) | ORAL | 0 refills | Status: DC | PRN
  Filled 2024-05-10 (×2): qty 60, 30d supply, fill #0

## 2024-04-10 ENCOUNTER — Other Ambulatory Visit (HOSPITAL_COMMUNITY): Payer: Self-pay

## 2024-04-10 MED ORDER — AMLODIPINE BESYLATE 5 MG PO TABS
5.0000 mg | ORAL_TABLET | Freq: Every day | ORAL | 3 refills | Status: AC
  Filled 2024-04-10: qty 90, 90d supply, fill #0
  Filled 2024-07-06 (×2): qty 90, 90d supply, fill #1
  Filled 2024-10-06: qty 90, 90d supply, fill #2
  Filled 2025-01-06: qty 90, 90d supply, fill #0

## 2024-05-08 ENCOUNTER — Encounter: Payer: Self-pay | Admitting: Adult Health

## 2024-05-08 ENCOUNTER — Encounter: Admitting: Adult Health

## 2024-05-10 ENCOUNTER — Other Ambulatory Visit (HOSPITAL_COMMUNITY): Payer: Self-pay

## 2024-05-18 ENCOUNTER — Encounter: Payer: Self-pay | Admitting: Adult Health

## 2024-05-18 ENCOUNTER — Ambulatory Visit: Admitting: Adult Health

## 2024-05-18 ENCOUNTER — Other Ambulatory Visit (HOSPITAL_COMMUNITY): Payer: Self-pay

## 2024-05-18 ENCOUNTER — Telehealth (INDEPENDENT_AMBULATORY_CARE_PROVIDER_SITE_OTHER): Admitting: Adult Health

## 2024-05-18 DIAGNOSIS — J432 Centrilobular emphysema: Secondary | ICD-10-CM | POA: Diagnosis not present

## 2024-05-18 MED ORDER — ALBUTEROL SULFATE HFA 108 (90 BASE) MCG/ACT IN AERS
1.0000 | INHALATION_SPRAY | Freq: Four times a day (QID) | RESPIRATORY_TRACT | 5 refills | Status: AC | PRN
  Filled 2024-05-18: qty 6.7, 25d supply, fill #0

## 2024-05-18 MED ORDER — STIOLTO RESPIMAT 2.5-2.5 MCG/ACT IN AERS
2.0000 | INHALATION_SPRAY | Freq: Every day | RESPIRATORY_TRACT | 11 refills | Status: AC
  Filled 2024-05-18: qty 4, 30d supply, fill #0
  Filled 2024-06-27: qty 4, 30d supply, fill #1
  Filled 2024-07-25 – 2024-08-01 (×2): qty 4, 30d supply, fill #2
  Filled 2024-09-04: qty 4, 30d supply, fill #3
  Filled 2024-10-06: qty 4, 30d supply, fill #4
  Filled 2024-11-15: qty 4, 30d supply, fill #5
  Filled 2024-12-25: qty 4, 30d supply, fill #6

## 2024-05-18 NOTE — Progress Notes (Signed)
 Virtual Visit via Video Note  I connected with [name] on 05/18/24 at  4:00 PM EDT by a video enabled telemedicine application and verified that I am speaking with the correct person using two identifiers.  Location: Patient: Home  Provider: Office    I discussed the limitations of evaluation and management by telemedicine and the availability of in person appointments. The patient expressed understanding and agreed to proceed.  History of Present Illness: 58 year old female active smoker followed for COPD  Medical history significant for Henoch Schonlein purpura, leukocytoclastic vasculitis in 2006 Participate in the lung cancer CT screening program  Today's video visit is a 1 year follow-up for COPD.  She says overall her breathing is doing okay.  S she is on a maintenance regimen with Stiolto daily.  She remains very active.  She helps with her father who is elderly.  She works full-time she is an Charity fundraiser in the urgent care.  She denies any flare of her cough or wheezing.  No increased albuterol  use.  She would like refills of her inhalers.  Her influenza and Pneumovax are up-to-date.  We did discuss RSV and COVID vaccines for the fall time.  She denies any hemoptysis chest pain orthopnea PND or leg swelling.  She does continue to smoke we discussed smoking cessation.  Past Medical History:  Diagnosis Date   ADHD (attention deficit hyperactivity disorder)    Anxiety    Asthma    CAD (coronary artery disease)    a. 11/2012 Acute Anterior STEMI/Cath: Nonobs dzs except subtotal occlusion of small, 1mm D2, EF 55-65%;  b. 11/2012  Echo: EF 60-65%, nl wall motion, Gr 2 DD.   Chronic kidney disease    in setting of HSP 2006   Coronary vasospasm (HCC)    a. 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 60 tablet 0   amLODipine  (NORVASC ) 5 MG tablet Take 1 tablet (5 mg total) by mouth daily. 90 tablet 3   aspirin  EC 81 MG EC tablet Take 1 tablet (81 mg total) by mouth daily.     atorvastatin  (LIPITOR) 40 MG tablet Take 1 tablet (40 mg total) by mouth daily at 6 PM. 90 tablet 3   calcium -vitamin D  (OSCAL WITH D) 500-200 MG-UNIT per tablet Take 1 tablet by mouth daily.     Cholecalciferol (VITAMIN D3) 2000 units TABS Take 2,000 Units by mouth daily.     clarithromycin  (BIAXIN ) 500 MG tablet Take 1 tablet (500 mg total) by mouth 2 (two) times daily. (Patient not taking: Reported on 03/26/2024) 20 tablet 0   desvenlafaxine  (PRISTIQ ) 50 MG 24 hr tablet Take 1 tablet (50 mg  total) by mouth daily. 90 tablet 2   enalapril  (VASOTEC ) 10 MG tablet Take 1 tablet (10 mg total) by mouth daily. 90 tablet 2   HYDROcodone  bit-homatropine (HYCODAN) 5-1.5 MG/5ML syrup Take 5 mLs by mouth every 8 (eight) hours as needed for cough. (Patient not taking: Reported on 03/26/2024) 120 mL 0   HYDROcodone -acetaminophen  (NORCO/VICODIN) 5-325 MG tablet Take 1 tablet by mouth twice a day as needed for pain 60 tablet 0   HYDROcodone -acetaminophen  (NORCO/VICODIN) 5-325 MG tablet Take 1 tablet by mouth twice a day as needed for pain (Patient not taking: Reported on 03/26/2024) 60 tablet 0   HYDROcodone -acetaminophen  (NORCO/VICODIN) 5-325 MG tablet Take 1 tablet by mouth twice a day as needed for pain (Patient not  taking: Reported on 03/26/2024) 60 tablet 0   HYDROcodone -acetaminophen  (NORCO/VICODIN) 5-325 MG tablet Take 1 tablet by mouth 2 (two) times daily as needed for pain. (Patient not taking: Reported on 03/26/2024) 60 tablet 0   HYDROcodone -acetaminophen  (NORCO/VICODIN) 5-325 MG tablet Take 1 tablet by mouth 2 (two) times daily as needed for pain. (Patient not taking: Reported on 03/26/2024) 60 tablet 0   HYDROcodone -acetaminophen  (NORCO/VICODIN) 5-325 MG tablet Take 1 tablet by mouth twice a day as needed for pain (Patient not taking: Reported on 03/26/2024) 60 tablet 0   HYDROcodone -acetaminophen  (NORCO/VICODIN) 5-325 MG tablet Take 1 tablet by mouth twice a day as needed for pain (Patient not taking: Reported on 03/26/2024) 60 tablet 0   HYDROcodone -acetaminophen  (NORCO/VICODIN) 5-325 MG tablet Take 1 tablet by mouth twice a day as needed for pain. (Patient not taking: Reported on 03/26/2024) 60 tablet 0   HYDROcodone -acetaminophen  (NORCO/VICODIN) 5-325 MG tablet Take 1 tablet by mouth twice a day as needed for pain 60 tablet 0   HYDROcodone -acetaminophen  (NORCO/VICODIN) 5-325 MG tablet Take 1 tablet by mouth twice a day as needed for pain 60 tablet 0   ketoconazole  (NIZORAL ) 2 % cream Apply 1 application topically daily. 15 g 0   levothyroxine  (SYNTHROID ) 75 MCG tablet Take 1 tablet (75 mcg total) by mouth daily. 90 tablet 1   loratadine (CLARITIN) 10 MG tablet Take 10 mg by mouth daily.     naloxone  (NARCAN ) nasal spray 4 mg/0.1 mL 1 spray into one nostril as a single dose as needed may repeat dose every 2-3 min until patient responsive or EMS arrives 2 each 1   nitroGLYCERIN  (NITROSTAT ) 0.4 MG SL tablet Dissolve 1 tablet under the tongue every 5 minutes as needed for chest pain. Max of 3 doses, then 911. 25 tablet 6   pantoprazole  (PROTONIX ) 40 MG tablet Take 1 tablet (40 mg total) by mouth 2 (two) times daily before a meal. 180 tablet 3   tiZANidine  (ZANAFLEX ) 2 MG tablet Take 1 tablet (2 mg total) by  mouth 3 (three) times daily as needed. 90 tablet 1   No current facility-administered medications on file prior to visit.       Observations/Objective: Low-dose CT chest February 07, 2024 showed emphysema with no suspicious pulmonary nodules  Assessment and Plan: Presumed COPD and an active smoker.  Smoking cessation discussed.  Continue with yearly lung cancer CT chest screening program.  Set up for PFTs on return visit.  Tobacco abuse.  Smoking cessation discussed.  Continue yearly CT screening program  Plan  Patient Instructions  Continue on Stiolto 2 puffs daily  Albuterol  inhaler As needed   Work on not smoking .  Continue with yearly CT chest -Lung cancer CT screening program Activity as tolerated.  Follow up      Follow Up Instructions:    I discussed the assessment and treatment plan with the patient. The patient was provided an opportunity to ask questions and all were answered. The patient agreed with the plan and demonstrated an understanding of the instructions.   The patient was advised to call back or seek an in-person evaluation if the symptoms worsen or if the condition fails to improve as anticipated.  I provided 20 minutes of non-face-to-face time during this encounter.   Roena Clark, NP

## 2024-05-18 NOTE — Patient Instructions (Addendum)
 Continue on Stiolto 2 puffs daily  Albuterol  inhaler As needed   Work on not smoking .  Continue with yearly CT chest -Lung cancer CT screening program Activity as tolerated.  Follow up in 1 year with Dr. Diania Fortes or Tasman Zapata NP and  As needed  -30 min slot

## 2024-05-23 ENCOUNTER — Other Ambulatory Visit: Payer: Self-pay | Admitting: Internal Medicine

## 2024-05-23 ENCOUNTER — Other Ambulatory Visit (HOSPITAL_COMMUNITY): Payer: Self-pay

## 2024-05-23 DIAGNOSIS — I2510 Atherosclerotic heart disease of native coronary artery without angina pectoris: Secondary | ICD-10-CM

## 2024-05-23 DIAGNOSIS — I10 Essential (primary) hypertension: Secondary | ICD-10-CM

## 2024-05-24 ENCOUNTER — Other Ambulatory Visit (HOSPITAL_COMMUNITY): Payer: Self-pay

## 2024-05-24 ENCOUNTER — Other Ambulatory Visit: Payer: Self-pay

## 2024-05-24 DIAGNOSIS — I10 Essential (primary) hypertension: Secondary | ICD-10-CM

## 2024-05-24 DIAGNOSIS — I2510 Atherosclerotic heart disease of native coronary artery without angina pectoris: Secondary | ICD-10-CM

## 2024-05-24 MED ORDER — ENALAPRIL MALEATE 10 MG PO TABS
10.0000 mg | ORAL_TABLET | Freq: Every day | ORAL | 2 refills | Status: AC
  Filled 2024-05-24: qty 90, 90d supply, fill #0
  Filled 2024-08-21: qty 90, 90d supply, fill #1
  Filled 2024-11-15: qty 90, 90d supply, fill #2

## 2024-06-05 ENCOUNTER — Other Ambulatory Visit (HOSPITAL_COMMUNITY): Payer: Self-pay

## 2024-06-05 DIAGNOSIS — M2550 Pain in unspecified joint: Secondary | ICD-10-CM | POA: Diagnosis not present

## 2024-06-05 DIAGNOSIS — M47817 Spondylosis without myelopathy or radiculopathy, lumbosacral region: Secondary | ICD-10-CM | POA: Diagnosis not present

## 2024-06-05 DIAGNOSIS — M542 Cervicalgia: Secondary | ICD-10-CM | POA: Diagnosis not present

## 2024-06-05 DIAGNOSIS — G894 Chronic pain syndrome: Secondary | ICD-10-CM | POA: Diagnosis not present

## 2024-06-05 MED ORDER — HYDROCODONE-ACETAMINOPHEN 5-325 MG PO TABS
1.0000 | ORAL_TABLET | Freq: Two times a day (BID) | ORAL | 0 refills | Status: DC | PRN
  Filled 2024-07-09: qty 60, 30d supply, fill #0

## 2024-06-05 MED ORDER — HYDROCODONE-ACETAMINOPHEN 5-325 MG PO TABS
1.0000 | ORAL_TABLET | Freq: Two times a day (BID) | ORAL | 0 refills | Status: DC | PRN
  Filled 2024-06-07: qty 60, 30d supply, fill #0

## 2024-06-06 ENCOUNTER — Encounter (HOSPITAL_COMMUNITY): Payer: Self-pay

## 2024-06-06 ENCOUNTER — Other Ambulatory Visit (HOSPITAL_COMMUNITY): Payer: Self-pay

## 2024-06-07 ENCOUNTER — Other Ambulatory Visit (HOSPITAL_COMMUNITY): Payer: Self-pay

## 2024-07-03 ENCOUNTER — Other Ambulatory Visit: Payer: Self-pay | Admitting: Internal Medicine

## 2024-07-04 ENCOUNTER — Other Ambulatory Visit: Payer: Self-pay

## 2024-07-04 ENCOUNTER — Other Ambulatory Visit (HOSPITAL_COMMUNITY): Payer: Self-pay

## 2024-07-04 MED ORDER — PANTOPRAZOLE SODIUM 40 MG PO TBEC
40.0000 mg | DELAYED_RELEASE_TABLET | Freq: Two times a day (BID) | ORAL | 3 refills | Status: AC
  Filled 2024-07-04: qty 180, 90d supply, fill #0
  Filled 2024-10-06: qty 180, 90d supply, fill #1
  Filled 2025-01-06: qty 180, 90d supply, fill #0

## 2024-07-06 ENCOUNTER — Other Ambulatory Visit (HOSPITAL_COMMUNITY): Payer: Self-pay

## 2024-07-09 ENCOUNTER — Other Ambulatory Visit (HOSPITAL_COMMUNITY): Payer: Self-pay

## 2024-07-19 ENCOUNTER — Other Ambulatory Visit (HOSPITAL_COMMUNITY): Payer: Self-pay

## 2024-07-19 ENCOUNTER — Other Ambulatory Visit: Payer: Self-pay | Admitting: Cardiovascular Disease

## 2024-07-19 DIAGNOSIS — I10 Essential (primary) hypertension: Secondary | ICD-10-CM

## 2024-07-19 DIAGNOSIS — I2510 Atherosclerotic heart disease of native coronary artery without angina pectoris: Secondary | ICD-10-CM

## 2024-07-19 MED ORDER — ATORVASTATIN CALCIUM 40 MG PO TABS
40.0000 mg | ORAL_TABLET | Freq: Every day | ORAL | 2 refills | Status: AC
  Filled 2024-07-19 – 2024-08-01 (×2): qty 90, 90d supply, fill #0
  Filled 2024-10-28 – 2024-11-09 (×2): qty 90, 90d supply, fill #1

## 2024-07-30 ENCOUNTER — Ambulatory Visit: Payer: Self-pay | Admitting: *Deleted

## 2024-07-30 NOTE — Telephone Encounter (Signed)
 FYI Only or Action Required?: FYI only for provider.  Patient was last seen in primary care on 03/20/2024 by Perri Ronal PARAS, MD.  Called Nurse Triage reporting Back Pain.  Symptoms began about a month ago.  Interventions attempted: OTC medications: ibuprofen 400 mg .  Symptoms are: gradually worsening.  Triage Disposition: See PCP When Office is Open (Within 3 Days)  Patient/caregiver understands and will follow disposition?: Yes            Copied from CRM #[id number]. Topic: Clinical - Red Word Triage >> Jul 30, 2024  9:31 AM Henretta I wrote: Red Word that prompted transfer to Nurse Triage: Patient is having severe lower right back pain Reason for Disposition  [1] MODERATE back pain (e.g., interferes with normal activities) AND [2] present > 3 days  Answer Assessment - Initial Assessment Questions Appt already scheduled for 08/02/24. Recommended if pain worsens call back or go to UC/ED.     1. ONSET: When did the pain begin? (e.g., minutes, hours, days)     1 month ago  2. LOCATION: Where does it hurt? (upper, mid or lower back)     Low back right inner thigh and hip 3. SEVERITY: How bad is the pain?  (e.g., Scale 1-10; mild, moderate, or severe)     Moderate  4. PATTERN: Is the pain constant? (e.g., yes, no; constant, intermittent)      Comes and goes laying causes worsening pain 5. RADIATION: Does the pain shoot into your legs or somewhere else?     Across hips  and now to right inner thighs 6. CAUSE:  What do you think is causing the back pain?      Not sure  7. BACK OVERUSE:  Any recent lifting of heavy objects, strenuous work or exercise?     na 8. MEDICINES: What have you taken so far for the pain? (e.g., nothing, acetaminophen , NSAIDS)     Ibuprofen 400 mg  9. NEUROLOGIC SYMPTOMS: Do you have any weakness, numbness, or problems with bowel/bladder control?     No  10. OTHER SYMPTOMS: Do you have any other symptoms? (e.g., fever, abdomen  pain, burning with urination, blood in urine)       Low back pain right hip throbbing pain , radiates to right inner thigh 11. PREGNANCY: Is there any chance you are pregnant? When was your last menstrual period?       na  Protocols used: Back Pain-A-AH

## 2024-07-31 ENCOUNTER — Other Ambulatory Visit (HOSPITAL_COMMUNITY): Payer: Self-pay

## 2024-07-31 DIAGNOSIS — M47817 Spondylosis without myelopathy or radiculopathy, lumbosacral region: Secondary | ICD-10-CM | POA: Diagnosis not present

## 2024-07-31 DIAGNOSIS — G894 Chronic pain syndrome: Secondary | ICD-10-CM | POA: Diagnosis not present

## 2024-07-31 DIAGNOSIS — M542 Cervicalgia: Secondary | ICD-10-CM | POA: Diagnosis not present

## 2024-07-31 DIAGNOSIS — M2550 Pain in unspecified joint: Secondary | ICD-10-CM | POA: Diagnosis not present

## 2024-07-31 DIAGNOSIS — Z79891 Long term (current) use of opiate analgesic: Secondary | ICD-10-CM | POA: Diagnosis not present

## 2024-07-31 MED ORDER — HYDROCODONE-ACETAMINOPHEN 5-325 MG PO TABS
1.0000 | ORAL_TABLET | Freq: Two times a day (BID) | ORAL | 0 refills | Status: DC | PRN
  Filled 2024-08-07: qty 60, 30d supply, fill #0

## 2024-07-31 MED ORDER — HYDROCODONE-ACETAMINOPHEN 5-325 MG PO TABS
1.0000 | ORAL_TABLET | Freq: Two times a day (BID) | ORAL | 0 refills | Status: DC | PRN
  Filled 2024-09-07: qty 60, 30d supply, fill #0

## 2024-08-01 ENCOUNTER — Other Ambulatory Visit (HOSPITAL_COMMUNITY): Payer: Self-pay

## 2024-08-02 ENCOUNTER — Encounter: Payer: Self-pay | Admitting: Internal Medicine

## 2024-08-02 ENCOUNTER — Other Ambulatory Visit (HOSPITAL_COMMUNITY): Payer: Self-pay

## 2024-08-02 ENCOUNTER — Ambulatory Visit: Admitting: Internal Medicine

## 2024-08-02 VITALS — BP 130/90 | HR 96 | Ht 65.0 in | Wt 186.0 lb

## 2024-08-02 DIAGNOSIS — M25551 Pain in right hip: Secondary | ICD-10-CM

## 2024-08-02 DIAGNOSIS — I10 Essential (primary) hypertension: Secondary | ICD-10-CM

## 2024-08-02 DIAGNOSIS — F439 Reaction to severe stress, unspecified: Secondary | ICD-10-CM | POA: Diagnosis not present

## 2024-08-02 MED ORDER — METHYLPREDNISOLONE 4 MG PO TBPK
ORAL_TABLET | ORAL | 0 refills | Status: AC
  Filled 2024-08-02: qty 21, 6d supply, fill #0

## 2024-08-02 NOTE — Progress Notes (Addendum)
 Patient Care Team: Perri Ronal PARAS, MD as PCP - General (Internal Medicine) Wonda Sharper, MD as PCP - Cardiology (Cardiology) Parrett, Madelin RAMAN, NP as Nurse Practitioner (Pulmonary Disease)  Visit Date: 08/02/24  Subjective:   Chief Complaint  Patient presents with   Back Pain    Going on for a month it has progressed for the last couple of weeks. No falls and no injuries per patient.    Hip Pain    Right hip, random tingling.     Vitals:   08/02/24 1515 08/02/24 1531 08/02/24 1608  BP: (!) 140/90 (!) 160/100 (!) 130/90   Patient PI:Xpfazmob [name], [name] DOB:1966-07-12,58 y.o. FMW:[id number]   58 y.o.Female presents today for acute visit with Back Pain x 1 month; Right Hip Pain. Patient has a past medical history of HTN; CAD; Musculoskeletal Pain.  She denies any falls or injuries prior to onset of back pain, but also associates this more so with her right hip and pelvis. Pain described as intermittent and affecting her sleep as she can not get comfortable at night. She says she started taking Ibuprofen this week for pain management, which this has provided enough relief for her to sleep, but not into the morning. Denies associated numbness or tightness within right buttock, but has had occasionally tingling within right hip.   In 2022, she was seen by Dr. Lonni Poli regarding pain in her left leg. Lumbar spine x-ray showed no acute findings, slight degenerative changes between L5-S1 and posterior elements. Left Hip xray with the hip joint space is well-maintained with both hips.  History of Hypertension treated with Amlodipine  5 mg and Enalapril  10 mg daily Blood pressure elevated today at 140/90 initially, 160/100  16 minutes later.  Discussed situational stress regarding her elderly father taking a fall and breaking his hip, requiring surgery, PT, and 24/7 care. She says that she is overwhelmed some with this as her father's POA, but can not delegate any  duties to her brother  Past Medical History:  Diagnosis Date   ADHD (attention deficit hyperactivity disorder)    Anxiety    Asthma    CAD (coronary artery disease)    a. 11/2012 Acute Anterior STEMI/Cath: Nonobs dzs except subtotal occlusion of small, 1mm D2, EF 55-65%;  b. 11/2012  Echo: EF 60-65%, nl wall motion, Gr 2 DD.   Chronic kidney disease    in setting of HSP 2006   Coronary vasospasm (HCC)    a. 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 per chart   Tobacco abuse     Allergies  Allergen Reactions   Levaquin [Levofloxacin] Hives   Nsaids Other (See Comments)    Able to take aspirin  Had HSP; since then they treat my kidneys very nicely (11/22/2012)   Immunization History  Administered Date(s) Administered   Influenza Split 09/11/2013   Influenza, Seasonal, Injecte, Preservative Fre 09/20/2023   Influenza,inj,Quad PF,6+ Mos 09/24/2018,  09/06/2019, 08/25/2020   Influenza-Unspecified 10/06/2012, 09/05/2014, 09/01/2015, 09/28/2021, 09/16/2022   PFIZER(Purple Top)SARS-COV-2 Vaccination 12/24/2019, 01/14/2020, 10/02/2020, 04/24/2021, 12/11/2021   PNEUMOCOCCAL CONJUGATE-20 02/25/2022   Past Surgical History:  Procedure Laterality Date   APPENDECTOMY  1970's   CARDIAC CATHETERIZATION  11/22/2012   first one (11/22/2012)   CESAREAN SECTION  1995; 1996; 1999   DILATION AND CURETTAGE OF UTERUS  1990's   LEFT HEART CATHETERIZATION WITH CORONARY ANGIOGRAM N/A 11/22/2012   Procedure: LEFT HEART  CATHETERIZATION WITH CORONARY ANGIOGRAM;  Surgeon: Ozell Fell, MD;  Location: [facility] C[facility] LAB;  Service: Cardiovascular;  Laterality: N/A;    Family History  Problem Relation Age of Onset   Stroke Father    Hypertension Mother    Heart disease Neg Hx    Social History   Social History Narrative   She is married.  3 adult sons.  Does not consume alcohol.  Father is in poor health and lives nearby and she has to check on him frequently.  She has 2 brothers who help take care of their father some.  Father seems to have some dementia.   Review of Systems  Constitutional:  Negative for fever and malaise/fatigue.  HENT:  Negative for congestion.   Eyes:  Negative for blurred vision.  Respiratory:  Negative for cough and shortness of breath.   Cardiovascular:  Negative for chest pain, palpitations and leg swelling.  Gastrointestinal:  Negative for vomiting.  Musculoskeletal:  Positive for back pain (right hip?) and joint pain (right hip).  Skin:  Negative for rash.  Neurological:  Negative for loss of consciousness and headaches.     Objective:  Vitals: body mass index is 30.95 kg/m. Today's Vitals   08/02/24 1515 08/02/24 1531 08/02/24 1608  BP: (!) 140/90 (!) 160/100 (!) 130/90  Pulse: 96    SpO2: 96%    Weight: 186 lb (84.4 kg)    Height: 5' 5 (1.651 m)    PainSc: 3     PainLoc: Back      Physical Exam Vitals and nursing note reviewed.  Constitutional:      General: She is not in acute distress.    Appearance: Normal appearance. She is not toxic-appearing.  HENT:     Head: Normocephalic and atraumatic.  Pulmonary:     Effort: Pulmonary effort is normal.  Musculoskeletal:     Lumbar back: Negative right straight leg raise test.     Right hip: Normal range of motion.     Comments: Muscle strength in R. Leg 5/5    Skin:    General: Skin is warm and dry.  Neurological:     Mental Status: She is alert and oriented to person, place, and time. Mental status is at  baseline.     Deep Tendon Reflexes:     Reflex Scores:      Patellar reflexes are 2+ on the right side. Psychiatric:        Mood and Affect: Mood normal.        Behavior: Behavior normal.        Thought Content: Thought content normal.        Judgment: Judgment normal.     Results:  Studies Obtained And Personally Reviewed By Me:  Labs:  CBC w/ Differential Lab Results  Component Value Date   WBC 7.9 09/19/2023   RBC 4.62 09/19/2023   HGB 14.6 09/19/2023   HCT 44.2 09/19/2023   PLT 187 09/19/2023   MCV 95.7 09/19/2023   MCH  31.6 09/19/2023   MCHC 33.0 09/19/2023   RDW 11.4 09/19/2023   MPV 12.0 09/19/2023   LYMPHSABS 2,591 09/19/2023   MONOABS 518 06/21/2017   BASOSABS 24 09/19/2023    Comprehensive Metabolic Panel Lab Results  Component Value Date   NA 140 09/19/2023   K 4.4 09/19/2023   CL 102 09/19/2023   CO2 28 09/19/2023   GLUCOSE 113 (H) 09/19/2023   BUN 11 09/19/2023   CREATININE 0.85 09/19/2023   CALCIUM  9.5 09/19/2023   PROT 6.6 03/19/2024   ALBUMIN 3.9 06/21/2017   AST 26 03/19/2024   ALT 24 03/19/2024   ALKPHOS 91 06/21/2017   BILITOT 0.5 03/19/2024   GFR 83.90 01/01/2013   EGFR 79 09/19/2023   GFRNONAA 63 12/24/2020   Lipid Panel  Lab Results  Component Value Date   CHOL 149 03/19/2024   HDL 56 03/19/2024   LDLCALC 70 03/19/2024   TRIG 152 (H) 03/19/2024   A1c Lab Results  Component Value Date   HGBA1C 5.9 (H) 03/19/2024    TSH Lab Results  Component Value Date   TSH 0.93 03/19/2024  No results found for any visits on 08/02/24. Assessment & Plan:   Orders Placed This Encounter  Procedures   DG Hip Unilat W OR W/O Pelvis 2-3 Views Right    Reason for Exam (SYMPTOM  OR DIAGNOSIS REQUIRED):   right hip pain    Is patient pregnant?:   No    Preferred imaging location?:   [facility]   Right Hip Pain: no prior falls/injuries to onset of back pain and pain is not associated with numbness or tightness within right buttock,  which could indicate sciatica. Has had occasionally tingling within right hip, and pain seem to be more associated with right hip, affecting her back. Pain described as intermittent and affecting her sleep as she can not get comfortable at night. She started taking Ibuprofen this week for pain management, which has provided enough relief for her to sleep, but not after waking up in the morning. In 2022, she was seen by Dr. Lonni Poli regarding pain in her left leg. Lumbar spine x-ray showed no acute findings, slight degenerative changes between L5-S1 and posterior elements. Left Hip xray with the hip joint space is well-maintained with both hips.  Ordering xray of right hip for evaluation. Sending in 4 mg Medrol  tapering course 6-5-4-3-2-1.   Hypertension treated with Amlodipine  5 mg and Enalapril  10 mg daily Blood pressure elevated today at 140/90 initially, 160/100  16 minutes later.  Discussed Situational Stress regarding her elderly father taking a fall and breaking his hip, requiring surgery, PT, and 24/7 care. 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 and spend time with her father.     I,Emily Lagle,acting as a Neurosurgeon for Ronal JINNY Hailstone, MD.,have documented all relevant documentation on the behalf of Ronal JINNY Hailstone, MD,as directed by  Ronal JINNY Hailstone, MD while in the presence of Ronal JINNY Hailstone, MD.  I, Ronal JINNY Hailstone, MD, have reviewed all documentation for this visit. The documentation on 08/02/2024 for the exam, diagnosis, procedures, and orders are all accurate and complete.

## 2024-08-03 ENCOUNTER — Ambulatory Visit: Payer: Self-pay | Admitting: Internal Medicine

## 2024-08-03 ENCOUNTER — Ambulatory Visit
Admission: RE | Admit: 2024-08-03 | Discharge: 2024-08-03 | Disposition: A | Discharge status: Home / Self Care | Source: Ambulatory Visit | Attending: Internal Medicine | Admitting: Internal Medicine

## 2024-08-03 DIAGNOSIS — M25551 Pain in right hip: Secondary | ICD-10-CM | POA: Diagnosis not present

## 2024-08-05 NOTE — Patient Instructions (Addendum)
 Xray of right hip is negative. Please take Medrol  as directed. Call if not improving within a few days or sooner if worse. Blood pressure is stable. Sorry to hear about your father's accident.

## 2024-08-07 ENCOUNTER — Other Ambulatory Visit (HOSPITAL_COMMUNITY): Payer: Self-pay

## 2024-08-07 ENCOUNTER — Other Ambulatory Visit: Payer: Self-pay

## 2024-09-07 ENCOUNTER — Other Ambulatory Visit (HOSPITAL_COMMUNITY): Payer: Self-pay

## 2024-09-14 ENCOUNTER — Other Ambulatory Visit (HOSPITAL_COMMUNITY): Payer: Self-pay

## 2024-09-14 ENCOUNTER — Encounter: Payer: Self-pay | Admitting: Internal Medicine

## 2024-09-14 ENCOUNTER — Ambulatory Visit: Admitting: Internal Medicine

## 2024-09-14 VITALS — BP 120/80 | HR 77 | Ht 65.0 in | Wt 183.0 lb

## 2024-09-14 DIAGNOSIS — F32A Depression, unspecified: Secondary | ICD-10-CM

## 2024-09-14 DIAGNOSIS — G8929 Other chronic pain: Secondary | ICD-10-CM

## 2024-09-14 DIAGNOSIS — M5441 Lumbago with sciatica, right side: Secondary | ICD-10-CM

## 2024-09-14 DIAGNOSIS — F439 Reaction to severe stress, unspecified: Secondary | ICD-10-CM

## 2024-09-14 DIAGNOSIS — I10 Essential (primary) hypertension: Secondary | ICD-10-CM

## 2024-09-14 DIAGNOSIS — F419 Anxiety disorder, unspecified: Secondary | ICD-10-CM | POA: Diagnosis not present

## 2024-09-14 DIAGNOSIS — I252 Old myocardial infarction: Secondary | ICD-10-CM | POA: Diagnosis not present

## 2024-09-14 MED ORDER — METHYLPREDNISOLONE 4 MG PO TBPK
ORAL_TABLET | ORAL | 1 refills | Status: AC
  Filled 2024-09-14: qty 21, 6d supply, fill #0

## 2024-09-14 NOTE — Progress Notes (Signed)
 Patient Care Team: Perri Amy PARAS, MD as PCP - General (Internal Medicine) Wonda Sharper, MD as PCP - Cardiology (Cardiology) Parrett, Madelin RAMAN, NP as Nurse Practitioner (Pulmonary Disease)  Visit Date: 09/14/24  Subjective:   Chief Complaint  Patient presents with   Back Pain   Anxiety   Patient PI:[name], Eichholz DOB:November 13, 1966,58 y.o. FMW:[id number]   58 y.o.Female presents today for acute visit with Back Pain; Anxiety. Patient has a past medical history of Musculoskeletal Pain; Anxiety/Depressionl Hx of Smoking/Tobacco Abuse; Hx of MI, Anterior Wall in 2013. She says that she has been experiencing anxiety surround her father her is currently homebound, so she is having to provide more care for him than usual. Alongside this, she has reportedly been suffering from recurrent right-sided back pain, as well as some chest pain.  Past Medical History:  Diagnosis Date   ADHD (attention deficit hyperactivity disorder)    Anxiety    Asthma    CAD (coronary artery disease)    a. 11/2012 Acute Anterior STEMI/Cath: Nonobs dzs except subtotal occlusion of small, 1mm D2, EF 55-65%;  b. 11/2012  Echo: EF 60-65%, nl wall motion, Gr 2 DD.   Chronic kidney disease    in setting of HSP 2006   Coronary vasospasm    a. 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   Tobacco abuse     Allergies  Allergen Reactions   Levaquin [Levofloxacin] Hives   Nsaids Other (See Comments)    Able to take aspirin  Had  HSP; since then they treat my kidneys very nicely (11/22/2012)   Immunization History  Administered Date(s) Administered   Influenza Split 09/11/2013   Influenza, Seasonal, Injecte, Preservative Fre 09/20/2023   Influenza,inj,Quad PF,6+ Mos 09/24/2018, 09/06/2019, 08/25/2020   Influenza-Unspecified 10/06/2012, 09/05/2014, 09/01/2015, 09/28/2021, 09/16/2022   PFIZER(Purple Top)SARS-COV-2 Vaccination 12/24/2019, 01/14/2020, 10/02/2020, 04/24/2021, 12/11/2021   PNEUMOCOCCAL CONJUGATE-20 02/25/2022   Past Surgical History:  Procedure Laterality Date   APPENDECTOMY  1970's   CARDIAC CATHETERIZATION  11/22/2012   first one (11/22/2012)   CESAREAN SECTION  1995; 1996; 1999   DILATION AND CURETTAGE OF UTERUS  1990's   LEFT HEART CATHETERIZATION WITH CORONARY ANGIOGRAM N/A 11/22/2012   Procedure: LEFT HEART CATHETERIZATION WITH CORONARY ANGIOGRAM;  Surgeon: Sharper Wonda, MD;  Location: [facility] CATH LAB;  Service: Cardiovascular;  Laterality: N/A;    Family History  Problem Relation Age of Onset   Stroke Father    Hypertension Mother    Heart disease Neg Hx    Social History   Social History Narrative   She is married.  3 adult sons.  Does not consume alcohol.  Father is in poor health and lives nearby and she has to check on him frequently.  She has 2 brothers who help take care of their father some.  Father seems to have some dementia.   Review of Systems  Cardiovascular:  Positive for chest pain.  Musculoskeletal:  Positive for back pain.  Psychiatric/Behavioral:  The patient is nervous/anxious.        (+) Situational Sress     Objective:  Vitals: BP 120/80   Pulse 77   Ht 5' 5 (1.651 m)   Wt 183 lb (83 kg)   SpO2 97%   BMI 30.45 kg/m   Physical Exam Vitals and nursing note reviewed.  Constitutional:      General: She is not in acute distress.    Appearance: Normal appearance. She is not toxic-appearing.  HENT:     Head: Normocephalic and atraumatic.  Cardiovascular:      Rate and Rhythm: Normal rate and regular rhythm. No extrasystoles are present.    Pulses: Normal pulses.     Heart sounds: Normal heart sounds. No murmur heard.    No friction rub. No gallop.  Pulmonary:     Effort: Pulmonary effort is normal. No respiratory distress.     Breath sounds: Normal breath sounds. No wheezing or rales.  Skin:    General: Skin is warm and dry.  Neurological:     Mental Status: She is alert and oriented to person, place, and time. Mental status is at baseline.  Psychiatric:        Mood and Affect: Mood normal.        Behavior: Behavior normal.        Thought Content: Thought content normal.        Judgment: Judgment normal.     Results:  Studies Obtained And Personally Reviewed By Me:  DG Hip Unilat W OR W/O Pelvis 2-3 Views Right Result Date: 08/03/2024 CLINICAL DATA:  Atraumatic right hip pain.  EXAM: DG HIP (WITH OR WITHOUT PELVIS) 2-3V RIGHT  COMPARISON:  None Available.  FINDINGS: The hip joint spaces preserved. Femoral head is well seated. No fracture. No evidence of erosion or avascular necrosis. No focal bone lesion or bone destruction. Normal acetabular and femoral morphology. Intact bony pelvis. Pubic symphysis and sacroiliac joints are congruent. Normal soft tissues.  IMPRESSION: Negative radiographs of the right hip.  Electronically Signed   By: Andrea Gasman M.D.   On: 08/03/2024 15:24   Labs:  CBC w/ Differential Lab Results  Component Value Date   WBC 7.9 09/19/2023   RBC 4.62 09/19/2023   HGB 14.6 09/19/2023   HCT 44.2 09/19/2023   PLT 187 09/19/2023   MCV 95.7 09/19/2023   MCH 31.6 09/19/2023   MCHC 33.0 09/19/2023   RDW 11.4 09/19/2023   MPV 12.0 09/19/2023   LYMPHSABS 2,591 09/19/2023   MONOABS 518 06/21/2017   BASOSABS 24 09/19/2023    Comprehensive Metabolic Panel Lab Results  Component Value Date   NA 140 09/19/2023   K 4.4 09/19/2023   CL 102 09/19/2023   CO2 28 09/19/2023   GLUCOSE 113 (H) 09/19/2023    BUN 11 09/19/2023   CREATININE 0.85 09/19/2023   CALCIUM  9.5 09/19/2023   PROT 6.6 03/19/2024   ALBUMIN 3.9 06/21/2017   AST 26 03/19/2024   ALT 24 03/19/2024   ALKPHOS 91 06/21/2017   BILITOT 0.5 03/19/2024   GFR 83.90 01/01/2013   EGFR 79 09/19/2023   GFRNONAA 63 12/24/2020   Lipid Panel  Lab Results  Component Value Date   CHOL 149 03/19/2024   HDL 56 03/19/2024   LDLCALC 70 03/19/2024   TRIG 152 (H) 03/19/2024   A1c Lab Results  Component Value Date   HGBA1C 5.9 (H) 03/19/2024    TSH  Lab Results  Component Value Date   TSH 0.93 03/19/2024   No results found for any visits on 09/14/24. Assessment & Plan:   Orders Placed This Encounter  Procedures   Ambulatory referral to Orthopedic Surgery    Referral Priority:   Routine    Referral Type:   Surgical    Referral Reason:   Specialty Services Required    Referred to Provider:   Vernetta Lonni GRADE, MD    Requested Specialty:   Orthopedic Surgery    Number of Visits Requested:   1   Meds ordered this encounter  Medications   methylPREDNISolone  (MEDROL ) 4 MG TBPK tablet    Sig: Take 6 tablets (24 mg total) by mouth daily for 1 day, THEN 5 tablets (20 mg total) daily for 1 day, THEN 4 tablets (16 mg total) daily for 1 day, THEN 3 tablets (12 mg total) daily for 1 day, THEN 2 tablets (8 mg total) daily for 1 day, THEN 1 tablet (4 mg total) daily for 1 day.    Dispense:  21 tablet    Refill:  1    Chronic bilateral low back pain with right-sided sciatica: She c/o of back pain 08/02/2024, as well as tingling in her right hip. 08/03/2024 Right Hip Xray normal and in 2022, she was seen by Dr. Lonni Vernetta for left leg pain. At the time, lumbar spine x-ray showed no acute findings, slight degenerative changes between L5-S1 and posterior elements. She is followed by Dr. Cozetta at [facility], who prescribes Norco 5-325 mg to take twice daily as needed.  Referring to Dr. Lonni Vernetta as she has  previously seen him in 2022. Sending in Medrol  tapering course 6-5-4-3-2-1 for further pain management.   Anxiety and depression; Situational stress: Anxiety/Depression managed with Xanax  0.5 mg twice daily as needed and Pristiq  50 mg daily respectively. She is currently experiencing situational stress while assisting her father, who is currently home bound.    Hypertension, essential; History of MI (myocardial infarction): Hypertension managed with Amlodipine  5 mg daily and Enalapril  10 mg daily. Blood pressure today normal at 120/80. Pt has been experiencing right-sided chest pain recently - does have Nitroglycerin  to take as needed. Hx of Anterior Wall MI in 2013    I,Emily Lagle,acting as a scribe for Amy JINNY Hailstone, MD.,have documented all relevant documentation on the behalf of Amy JINNY Hailstone, MD,as directed by  Amy JINNY Hailstone, MD while in the presence of Amy JINNY Hailstone, MD.  I, Amy JINNY Hailstone, MD, have reviewed all documentation for this visit. The documentation on 09/14/2024 for the exam, diagnosis, procedures, and orders are all accurate and complete.

## 2024-09-24 ENCOUNTER — Other Ambulatory Visit: Payer: Self-pay | Admitting: Internal Medicine

## 2024-09-24 ENCOUNTER — Other Ambulatory Visit

## 2024-09-24 ENCOUNTER — Other Ambulatory Visit (HOSPITAL_COMMUNITY): Payer: Self-pay

## 2024-09-24 DIAGNOSIS — Z0000 Encounter for general adult medical examination without abnormal findings: Secondary | ICD-10-CM

## 2024-09-24 DIAGNOSIS — I2510 Atherosclerotic heart disease of native coronary artery without angina pectoris: Secondary | ICD-10-CM

## 2024-09-24 DIAGNOSIS — E039 Hypothyroidism, unspecified: Secondary | ICD-10-CM

## 2024-09-24 DIAGNOSIS — I10 Essential (primary) hypertension: Secondary | ICD-10-CM

## 2024-09-24 DIAGNOSIS — R7302 Impaired glucose tolerance (oral): Secondary | ICD-10-CM

## 2024-09-24 DIAGNOSIS — E781 Pure hyperglyceridemia: Secondary | ICD-10-CM

## 2024-09-24 MED ORDER — LEVOTHYROXINE SODIUM 75 MCG PO TABS
75.0000 ug | ORAL_TABLET | Freq: Every day | ORAL | 1 refills | Status: AC
  Filled 2024-09-24: qty 90, 90d supply, fill #0
  Filled 2024-12-25: qty 90, 90d supply, fill #1

## 2024-09-24 NOTE — Progress Notes (Signed)
 Annual Comprehensive Physical Exam    Patient Care Team: Tin Engram, Ronal PARAS, MD as PCP - General (Internal Medicine) Wonda Sharper, MD as PCP - Cardiology (Cardiology) Parrett, Madelin RAMAN, NP as Nurse Practitioner (Pulmonary Disease)  Visit Date: 09/25/24   Chief Complaint  Patient presents with   Annual Exam   Subjective:  Patient: [name], Female DOB: Apr 07, 1966, 58 y.o. MRN: [id number] Vitals:   09/25/24 1109  BP: 110/78   [name] Krystalle Pilkington is a 58 y.o. Female who presents today for her  Annual Comprehensive Physical Exam. Patient has Musculoskeletal pain; Anxiety and depression; Attention deficit disorder; HSP (Henoch Schonlein purpura); Asthma; GE reflux; Hypertension; Hypothyroidism; History of migraine headaches; History of smoking; Acute MI anterior wall first episode care [facility]); CAD (coronary artery disease); Coronary vasospasm; Tobacco abuse; Obstructive sleep apnea; Impaired glucose tolerance; Small airways disease; and CAP (community acquired pneumonia) on their problem list.   She has situation stress due to her fathers health issues.     Advised on smoking cessation.   History of Hypertension; coronary artery Disease; Anterior Wall MI in 2013 treated with Amlodipine  5 mg daily and Enalapril  10 mg daily. Blood pressure today is normal at 110/70.  Last met with cardiologist 03/26/2024. Cardiology follow up soon.   History of Hyperlipidemia treated with Atorvastatin  40 mg daily. 09/24/2024 Lipid Panel normal.   History of Impaired Glucose Tolerance 09/24/2024 HgbA1c 5.8%.   History of Hypothyroidism treated with Levothyroxine  75 mcg. 09/24/2024 TSH 1.20.   History of GE Reflux treated with Protonix  40 mg twice daily.   History of Anxiety/ Depression; Situational stress due to work treated with Pristiq  50 mg daily and Xanax  0.5 mg as needed.    In October 2022, she had an E. coli UTI and left hip trochanteric bursitis.   She was referred to  Pulmonology in 2020 and was seen by Dr. Theophilus.  Was diagnosed with COPD and obstructive sleep apnea.  She had pulmonary functions in 2017 interpreted by Dr. Alaine and at the time did not show significant obstruction.  She was also evaluated for sleep apnea in 2017 showing mild disease and mild desaturation.  Continues to smoke. She met the screening criteria for low-dose CT given her smoking history and age.  In February 2022, saw Dr. Brenna regarding persistent respiratory symptoms that did not resolve.  She had lower lobe opacity on chest x-ray January 2022.  Was found to have left lower lobe pneumonia. Last CT chest scan 02/07/2024.  She is on atorvastatin  40 mg daily.  She needs follow-up CT scan annually.   Dr. Wonda is her Cardiologist.  She had an anterior wall MI in December 2013.  Had subtotal occlusion of a very small branch of the diagonal and no other significant disease.  She had cardiac cath and did well.   She had a very serious case of purpura in October 2006.  She developed purpuric lesions on her lower extremities and maroon-colored stools as well as anemia.  CT showed diffuse colitis.  She was transferred to [facility] after developing tea colored urine with hematuria.  She was treated with high-dose steroids which she remained on for several months.  She has never had recurrence.   In September 2009 she was hospitalized for gastroenteritis secondary to C. difficile.  She has a history of migraine headaches.  History of cervical radiculopathy in 2012 and has been evaluated by Dr. Fairy Levels in the remote past.  History of  attention deficit disorder but this is not being treated with medication at the present time.  History of endometrial polyp causing menorrhagia removed in 2003.  She had C-sections 1995, 1996 in 1999.  Appendectomy in the early 1970s.  Chronic fibromyalgia type pain for which she requires chronic pain management at [facility].   Labs 09/24/2024  HgbA1c 5.8%, Otherwise WNL.     02/07/2024 Annual Lung Cancer Screening Lung-RADS 1, negative. Continue annual screening with low-dose chest CT without contrast in 12 months. Age advanced three-vessel coronary artery calcification. Emphysema.    11/11/2023 Mammogram No mammographic evidence of malignancy. Repeat in one year.   11/10/2024 Colonoscopy 4 mm polyp in the descending colon. Resected and Retrieved. One diminutive polyp in the descending colon. Biopsied. Multiple 3 to 8 mm polyps at the recto-sigmoid colon. Biopsied.  Pathology found to be hyperplastic polyp and The examined portion of the ileum was normal.  The distal rectum and anal verge are normal on retroflexion views. Repeat in 10 years.      08/07/2019 Bone density AP spine T score 0.1   01/13/2022 Pap smear normal. Repeat in 2026.   Vaccine counseling: Influenza vaccine received today. Covid-19 and Hepatitis B vaccine due.   Health Maintenance  Topic Date Due   Hepatitis B Vaccines 19-59 Average Risk (1 of 3 - 19+ 3-dose series) Never done   Influenza Vaccine  07/06/2024   DEXA SCAN  08/06/2024   COVID-19 Vaccine (6 - 2025-26 season) 08/06/2024   Zoster Vaccines- Shingrix (1 of 2) 02/27/2025 (Originally 04/30/2015)   Cervical Cancer Screening (HPV/Pap Cotest)  01/13/2025   Mammogram  11/10/2025   Colonoscopy  11/17/2025   Pneumococcal Vaccine: 50+ Years  Completed   HPV VACCINES  Aged Out   Meningococcal B Vaccine  Aged Out   DTaP/Tdap/Td  Discontinued   Hepatitis C Screening  Discontinued   HIV Screening  Discontinued     Review of Systems  Constitutional:  Negative for fever and malaise/fatigue.  HENT:  Negative for congestion.   Eyes:  Negative for blurred vision.  Respiratory:  Negative for cough and shortness of breath.   Cardiovascular:  Negative for chest pain, palpitations and leg swelling.  Gastrointestinal:  Negative for vomiting.  Musculoskeletal:  Negative for back pain.  Skin:  Negative for  rash.  Neurological:  Negative for loss of consciousness and headaches.  All other systems reviewed and are negative.  Objective:  Vitals: body mass index is 30.45 kg/m. Today's Vitals   09/25/24 1109  BP: 110/78  Pulse: 82  SpO2: 98%  Weight: 183 lb (83 kg)  PainSc: 0-No pain   Physical Exam Vitals and nursing note reviewed.  Constitutional:      General: She is not in acute distress.    Appearance: Normal appearance. She is not ill-appearing or toxic-appearing.  HENT:     Head: Normocephalic and atraumatic.     Comments: Cerumen in left ear.     Right Ear: Hearing, tympanic membrane, ear canal and external ear normal.     Left Ear: Hearing, tympanic membrane, ear canal and external ear normal.     Mouth/Throat:     Pharynx: Oropharynx is clear.  Eyes:     Extraocular Movements: Extraocular movements intact.     Pupils: Pupils are equal, round, and reactive to light.  Neck:     Thyroid : No thyroid  mass, thyromegaly or thyroid  tenderness.     Vascular: No carotid bruit.  Cardiovascular:  Rate and Rhythm: Normal rate and regular rhythm. No extrasystoles are present.    Pulses:          Dorsalis pedis pulses are 2+ on the right side and 2+ on the left side.     Heart sounds: Normal heart sounds. No murmur heard.    No friction rub. No gallop.  Pulmonary:     Effort: Pulmonary effort is normal.     Breath sounds: Normal breath sounds. No decreased breath sounds, wheezing, rhonchi or rales.  Chest:     Chest wall: No mass.  Abdominal:     Palpations: Abdomen is soft. There is no hepatomegaly, splenomegaly or mass.     Tenderness: There is no abdominal tenderness.     Hernia: No hernia is present.  Musculoskeletal:     Cervical back: Normal range of motion.     Right lower leg: No edema.     Left lower leg: No edema.  Lymphadenopathy:     Cervical: No cervical adenopathy.     Upper Body:     Right upper body: No supraclavicular adenopathy.     Left upper body: No  supraclavicular adenopathy.  Skin:    General: Skin is warm and dry.  Neurological:     General: No focal deficit present.     Mental Status: She is alert and oriented to person, place, and time. Mental status is at baseline.     Sensory: Sensation is intact.     Motor: Motor function is intact. No weakness.     Deep Tendon Reflexes: Reflexes are normal and symmetric.  Psychiatric:        Attention and Perception: Attention normal.        Mood and Affect: Mood normal.        Speech: Speech normal.        Behavior: Behavior normal.        Thought Content: Thought content normal.        Cognition and Memory: Cognition normal.        Judgment: Judgment normal.     Current Outpatient Medications  Medication Instructions   albuterol  (VENTOLIN  HFA) 108 (90 Base) MCG/ACT inhaler 1-2 puffs, Inhalation, Every 6 hours PRN   ALPRAZolam  (XANAX ) 0.5 mg, Oral, 2 times daily PRN   amLODipine  (NORVASC ) 5 mg, Oral, Daily   aspirin  EC 81 mg, Oral, Daily   atorvastatin  (LIPITOR) 40 mg, Oral, Daily-1800   calcium -vitamin D  (OSCAL WITH D) 500-200 MG-UNIT per tablet 1 tablet, Daily   desvenlafaxine  (PRISTIQ ) 50 mg, Oral, Daily   enalapril  (VASOTEC ) 10 mg, Oral, Daily   HYDROcodone  bit-homatropine (HYCODAN) 5-1.5 MG/5ML syrup 5 mLs, Oral, Every 8 hours PRN   HYDROcodone -acetaminophen  (NORCO/VICODIN) 5-325 MG tablet Take 1 tablet by mouth twice a day as needed for pain   HYDROcodone -acetaminophen  (NORCO/VICODIN) 5-325 MG tablet Take 1 tablet by mouth twice a day as needed for pain   HYDROcodone -acetaminophen  (NORCO/VICODIN) 5-325 MG tablet Take 1 tablet by mouth twice a day as needed for pain   HYDROcodone -acetaminophen  (NORCO/VICODIN) 5-325 MG tablet Take 1 tablet by mouth 2 (two) times daily as needed for pain.   HYDROcodone -acetaminophen  (NORCO/VICODIN) 5-325 MG tablet Take 1 tablet by mouth 2 (two) times daily as needed for pain.   HYDROcodone -acetaminophen  (NORCO/VICODIN) 5-325 MG tablet Take 1  tablet by mouth twice a day as needed for pain   HYDROcodone -acetaminophen  (NORCO/VICODIN) 5-325 MG tablet Take 1 tablet by mouth twice a day as needed for pain  HYDROcodone -acetaminophen  (NORCO/VICODIN) 5-325 MG tablet Take 1 tablet by mouth twice a day as needed for pain.   HYDROcodone -acetaminophen  (NORCO/VICODIN) 5-325 MG tablet Take 1 tablet by mouth twice a day as needed for pain   HYDROcodone -acetaminophen  (NORCO/VICODIN) 5-325 MG tablet Take 1 tablet by mouth twice a day as needed for pain   HYDROcodone -acetaminophen  (NORCO/VICODIN) 5-325 MG tablet Take 1 tablet by mouth twice a day as needed for pain   HYDROcodone -acetaminophen  (NORCO/VICODIN) 5-325 MG tablet Take 1 tablet by mouth twice a day as needed for pain   HYDROcodone -acetaminophen  (NORCO/VICODIN) 5-325 MG tablet Take 1 tablet by mouth 2 (two) times daily as needed for pain.   HYDROcodone -acetaminophen  (NORCO/VICODIN) 5-325 MG tablet Take 1 tablet by mouth twice a day as needed for pain   ketoconazole  (NIZORAL ) 2 % cream 1 application , Topical, Daily   levothyroxine  (SYNTHROID ) 75 mcg, Oral, Daily   loratadine (CLARITIN) 10 mg, Daily   naloxone  (NARCAN ) nasal spray 4 mg/0.1 mL 1 spray into one nostril as a single dose as needed may repeat dose every 2-3 min until patient responsive or EMS arrives   nitroGLYCERIN  (NITROSTAT ) 0.4 MG SL tablet Dissolve 1 tablet under the tongue every 5 minutes as needed for chest pain. Max of 3 doses, then 911.   pantoprazole  (PROTONIX ) 40 mg, Oral, 2 times daily before meals   Tiotropium Bromide-Olodaterol (STIOLTO RESPIMAT ) 2.5-2.5 MCG/ACT AERS 2 puffs, Inhalation, Daily   tiZANidine  (ZANAFLEX ) 2 mg, Oral, 3 times daily PRN   Vitamin D3 2,000 Units, Daily   Past Medical History:  Diagnosis Date   ADHD (attention deficit hyperactivity disorder)    Anxiety    Asthma    CAD (coronary artery disease)    a. 11/2012 Acute Anterior STEMI/Cath: Nonobs dzs except subtotal occlusion of small, 1mm D2,  EF 55-65%;  b. 11/2012  Echo: EF 60-65%, nl wall motion, Gr 2 DD.   Chronic kidney disease    in setting of HSP 2006   Coronary vasospasm    a. 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   Tobacco abuse    Medical/Surgical History Narrative:  Allergic/Intolerant to:  Allergies  Allergen Reactions   Levaquin [Levofloxacin] Hives   Nsaids Other (See Comments)    Able to take aspirin  Had HSP; since then they treat my kidneys very nicely (11/22/2012)    Past Surgical History:  Procedure Laterality Date   APPENDECTOMY  1970's   CARDIAC CATHETERIZATION  11/22/2012   first one (11/22/2012)   CESAREAN SECTION  1995; 1996; 1999   DILATION AND CURETTAGE OF UTERUS  1990's   LEFT HEART CATHETERIZATION WITH CORONARY ANGIOGRAM N/A 11/22/2012   Procedure: LEFT HEART CATHETERIZATION WITH CORONARY ANGIOGRAM;  Surgeon: Ozell Fell, MD;  Location: [facility];  Service: Cardiovascular;  Laterality: N/A;   Family History  Problem Relation Age of Onset   Stroke Father    Hypertension Mother    Heart disease  Neg Hx    Family History Narrative: Mother passed away from an acute respiratory arrest. Both parents with history of hypertension. Father has had stroke in the remote past. 2 brothers in good health. No sisters.  Social History   Social History Narrative   She is married.  3 adult sons.  Does not consume alcohol.  Father is in poor health and lives nearby and she has to check on him frequently.  She has 2 brothers  who help take care of their father some.  Father seems to have some dementia.   Most Recent Health Risks Assessment:   Most Recent Social Determinants of Health (Including Hx of Tobacco, Alcohol, and Drug Use) SDOH Screenings   Food Insecurity: No Food Insecurity (09/24/2024)  Housing: Unknown (09/24/2024)  Transportation Needs: No Transportation Needs (09/24/2024)  Utilities: Not At Risk (09/20/2023)  Alcohol Screen: Low Risk  (09/20/2023)  Depression (PHQ2-9): Medium Risk (09/14/2024)  Financial Resource Strain: Low Risk  (09/24/2024)  Physical Activity: Insufficiently Active (09/24/2024)  Social Connections: Moderately Integrated (09/24/2024)  Stress: Stress Concern Present (09/24/2024)  Tobacco Use: High Risk (09/25/2024)  Health Literacy: Adequate Health Literacy (09/20/2023)   Social History   Tobacco Use   Smoking status: Every Day    Current packs/day: 0.50    Average packs/day: 0.5 packs/day for 35.0 years (17.5 ttl pk-yrs)    Types: Cigarettes   Smokeless tobacco: Never   Tobacco comments:    Smokes 2 1/2 pack of cigarettes week. 02/28/23 Tay  Vaping Use   Vaping status: Never Used  Substance Use Topics   Alcohol use: No   Drug use: No   Most Recent Functional Status Assessment:     No data to display         Most Recent Fall Risk Assessment:    03/15/2023    9:35 AM  Fall Risk   Falls in the past year? 0  Number falls in past yr: 0  Injury with Fall? 0  Risk for fall due to : No Fall Risks  Follow up Falls prevention discussed   Most Recent Anxiety/Depression Screenings:    09/14/2024   12:35 PM 08/02/2024    3:20 PM  PHQ 2/9 Scores  PHQ - 2 Score 4 2  PHQ- 9 Score 9 5      09/14/2024   12:35 PM 08/02/2024    3:24 PM  GAD 7 : Generalized Anxiety Score  Nervous, Anxious, on Edge 1 1  Control/stop worrying 2 1  Worry too much - different things 2 0  Trouble relaxing 1 1  Restless 1 0  Easily annoyed or irritable 1 0  Afraid - awful might  happen 1 1  Total GAD 7 Score 9 4  Anxiety Difficulty Somewhat difficult Not difficult at all    Results:  Studies Obtained And Personally Reviewed By Me:  02/07/2024 Annual Lung Cancer Screening Lung-RADS 1, negative. Continue annual screening with low-dose chest CT without contrast in 12 months. Age advanced three-vessel coronary artery calcification. Emphysema.    11/11/2023 Mammogram No mammographic evidence of malignancy. Repeat in one year.   11/10/2024 Colonoscopy 4 mm polyp in the descending colon. Resected and Retrieved. One diminutive polyp in the descending colon. Biopsied. Multiple 3 to 8 mm polyps at the recto-sigmoid colon. Biopsied.  Pathology found to be hyperplastic polyp and The examined portion of the ileum was normal.  The distal rectum and anal verge are normal on retroflexion views. Repeat in 10 years.  08/07/2019 Bone density AP spine T score 0.1   01/13/2022 Pap smear normal. Repeat in 2026.    Labs:  CBC w/ Differential Lab Results  Component Value Date   WBC 10.2 09/24/2024   RBC 4.70 09/24/2024   HGB 15.2 09/24/2024   HCT 44.9 09/24/2024   PLT 217 09/24/2024   MCV 95.5 09/24/2024   MCH 32.3 09/24/2024   MCHC 33.9 09/24/2024   RDW 11.7 09/24/2024   MPV 11.3 09/24/2024   LYMPHSABS 2,591 09/19/2023   MONOABS 518 06/21/2017   BASOSABS 51 09/24/2024    Comprehensive Metabolic Panel Lab Results  Component Value Date   NA 143 09/24/2024   K 4.6 09/24/2024   CL 106 09/24/2024   CO2 29 09/24/2024   GLUCOSE 97 09/24/2024   BUN 9 09/24/2024   CREATININE 0.77 09/24/2024   CALCIUM  9.1 09/24/2024   PROT 6.3 09/24/2024   ALBUMIN 3.9 06/21/2017   AST 23 09/24/2024   ALT 23 09/24/2024   ALKPHOS 91 06/21/2017   BILITOT 0.7 09/24/2024   GFR 83.90 01/01/2013   EGFR 79 09/19/2023   GFRNONAA 63 12/24/2020   Lipid Panel  Lab Results  Component Value Date   CHOL 132 09/24/2024   HDL 55 09/24/2024   LDLCALC 59 09/24/2024   TRIG 96 09/24/2024    A1c Lab Results  Component Value Date   HGBA1C 5.8 (H) 09/24/2024    TSH Lab Results  Component Value Date   TSH 1.20 09/24/2024    Assessment & Plan:   Orders Placed This Encounter  Procedures   DG Bone Density    Standing Status:   Future    Expiration Date:   09/25/2025    Reason for Exam (SYMPTOM  OR DIAGNOSIS REQUIRED):   postmenopausal estrogen deficiency    Is the patient pregnant?:   No    Preferred imaging location?:   [facility]   HM PAP SMEAR    This external order was created through the Results Console.   Hepatitis B surface antibody,qualitative   Advised on smoking cessation.   Hypertension; coronary artery Disease: Anterior Wall MI in 2013 treated with Amlodipine  5 mg daily and Enalapril  10 mg daily. Blood pressure today is normal at 110/70. Last met with cardiologist 03/26/2024. Cardiology follow up soon.   Hyperlipidemia: treated with Atorvastatin  40 mg daily. 09/24/2024 Lipid Panel normal  Impaired Glucose Tolerance: 09/24/2024 HgbA1c 5.8%.   Hypothyroidism: treated with Levothyroxine  75 mcg. 09/24/2024 TSH 1.20.   GE Reflux: treated with Protonix  40 mg twice daily.   Anxiety/ Depression: Situational stress due to work treated with Pristiq  50 mg daily and Xanax  0.5 mg as needed.    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 Continue annual screening with low-dose chest CT without contrast in 12 months. Age advanced three-vessel coronary artery calcification. Emphysema.    11/11/2023 Mammogram No mammographic evidence of malignancy. Repeat in one year.   11/10/2024 Colonoscopy 4 mm polyp in the descending colon. Resected and Retrieved. One diminutive polyp in the descending colon. Biopsied. Multiple 3 to 8 mm polyps at the recto-sigmoid colon. Biopsied.  Pathology found to be hyperplastic polyp and The examined portion of the ileum was normal.  The distal rectum and anal  verge are normal on retroflexion views. Repeat in 10 years.  08/07/2019 Bone density AP spine T score 0.1   01/13/2022 Pap smear normal. Repeat in 2026.   Vaccine counseling: Influenza vaccine  received today. Covid-19 and Hepatitis B vaccine due.     Annual Comprehensive Physical Exam done today including the all of the following: Reviewed patient's Family Medical History Reviewed patient's SDOH and reviewed tobacco, alcohol, and drug use.  Reviewed and updated list of patient's medical providers Assessment of cognitive impairment was done Assessed patient's functional ability Established a written schedule for health screening services Health Risk Assessent Completed and Reviewed  Discussed health benefits of physical activity, and encouraged her to engage in regular exercise appropriate for her age and condition.    I,Makayla C Reid,acting as a scribe for Ronal JINNY Hailstone, MD.,have documented all relevant documentation on the behalf of Ronal JINNY Hailstone, MD,as directed by  Ronal JINNY Hailstone, MD while in the presence of Ronal JINNY Hailstone, MD.  I, Ronal JINNY Hailstone, MD, have reviewed all documentation for and agree with the above Annual Wellness Visit documentation.  Ronal JINNY Hailstone, MD Internal Medicine 09/25/2024

## 2024-09-25 ENCOUNTER — Encounter: Payer: Self-pay | Admitting: Internal Medicine

## 2024-09-25 ENCOUNTER — Ambulatory Visit: Admitting: Internal Medicine

## 2024-09-25 VITALS — BP 110/78 | HR 82 | Wt 183.0 lb

## 2024-09-25 DIAGNOSIS — E785 Hyperlipidemia, unspecified: Secondary | ICD-10-CM

## 2024-09-25 DIAGNOSIS — Z0000 Encounter for general adult medical examination without abnormal findings: Secondary | ICD-10-CM | POA: Diagnosis not present

## 2024-09-25 DIAGNOSIS — F418 Other specified anxiety disorders: Secondary | ICD-10-CM | POA: Diagnosis not present

## 2024-09-25 DIAGNOSIS — K219 Gastro-esophageal reflux disease without esophagitis: Secondary | ICD-10-CM | POA: Diagnosis not present

## 2024-09-25 DIAGNOSIS — Z716 Tobacco abuse counseling: Secondary | ICD-10-CM

## 2024-09-25 DIAGNOSIS — I2510 Atherosclerotic heart disease of native coronary artery without angina pectoris: Secondary | ICD-10-CM

## 2024-09-25 DIAGNOSIS — E039 Hypothyroidism, unspecified: Secondary | ICD-10-CM

## 2024-09-25 DIAGNOSIS — G4733 Obstructive sleep apnea (adult) (pediatric): Secondary | ICD-10-CM

## 2024-09-25 DIAGNOSIS — M7918 Myalgia, other site: Secondary | ICD-10-CM

## 2024-09-25 DIAGNOSIS — Z6830 Body mass index (BMI) 30.0-30.9, adult: Secondary | ICD-10-CM

## 2024-09-25 DIAGNOSIS — Z1159 Encounter for screening for other viral diseases: Secondary | ICD-10-CM

## 2024-09-25 DIAGNOSIS — F439 Reaction to severe stress, unspecified: Secondary | ICD-10-CM

## 2024-09-25 DIAGNOSIS — K635 Polyp of colon: Secondary | ICD-10-CM

## 2024-09-25 DIAGNOSIS — I252 Old myocardial infarction: Secondary | ICD-10-CM

## 2024-09-25 DIAGNOSIS — R7302 Impaired glucose tolerance (oral): Secondary | ICD-10-CM | POA: Diagnosis not present

## 2024-09-25 DIAGNOSIS — F17200 Nicotine dependence, unspecified, uncomplicated: Secondary | ICD-10-CM

## 2024-09-25 DIAGNOSIS — I10 Essential (primary) hypertension: Secondary | ICD-10-CM | POA: Diagnosis not present

## 2024-09-25 DIAGNOSIS — G8929 Other chronic pain: Secondary | ICD-10-CM

## 2024-09-25 DIAGNOSIS — F32A Depression, unspecified: Secondary | ICD-10-CM

## 2024-09-25 DIAGNOSIS — Z13820 Encounter for screening for osteoporosis: Secondary | ICD-10-CM

## 2024-09-25 LAB — POCT URINALYSIS DIP (CLINITEK)
Bilirubin, UA: NEGATIVE
Blood, UA: NEGATIVE
Glucose, UA: NEGATIVE mg/dL
Ketones, POC UA: NEGATIVE mg/dL
Leukocytes, UA: NEGATIVE
Nitrite, UA: NEGATIVE
POC PROTEIN,UA: NEGATIVE
Spec Grav, UA: 1.01 (ref 1.010–1.025)
Urobilinogen, UA: 0.2 U/dL
pH, UA: 6.5 (ref 5.0–8.0)

## 2024-09-26 ENCOUNTER — Ambulatory Visit (INDEPENDENT_AMBULATORY_CARE_PROVIDER_SITE_OTHER)

## 2024-09-26 ENCOUNTER — Other Ambulatory Visit (HOSPITAL_COMMUNITY): Payer: Self-pay

## 2024-09-26 VITALS — BP 120/80 | HR 74 | Ht 65.0 in | Wt 183.0 lb

## 2024-09-26 DIAGNOSIS — M2550 Pain in unspecified joint: Secondary | ICD-10-CM | POA: Diagnosis not present

## 2024-09-26 DIAGNOSIS — M542 Cervicalgia: Secondary | ICD-10-CM | POA: Diagnosis not present

## 2024-09-26 DIAGNOSIS — G894 Chronic pain syndrome: Secondary | ICD-10-CM | POA: Diagnosis not present

## 2024-09-26 DIAGNOSIS — I10 Essential (primary) hypertension: Secondary | ICD-10-CM

## 2024-09-26 DIAGNOSIS — M47817 Spondylosis without myelopathy or radiculopathy, lumbosacral region: Secondary | ICD-10-CM | POA: Diagnosis not present

## 2024-09-26 DIAGNOSIS — Z23 Encounter for immunization: Secondary | ICD-10-CM

## 2024-09-26 LAB — COMPLETE METABOLIC PANEL WITHOUT GFR
AG Ratio: 1.9 (calc) (ref 1.0–2.5)
ALT: 23 U/L (ref 6–29)
AST: 23 U/L (ref 10–35)
Albumin: 4.1 g/dL (ref 3.6–5.1)
Alkaline phosphatase (APISO): 86 U/L (ref 37–153)
BUN: 9 mg/dL (ref 7–25)
CO2: 29 mmol/L (ref 20–32)
Calcium: 9.1 mg/dL (ref 8.6–10.4)
Chloride: 106 mmol/L (ref 98–110)
Creat: 0.77 mg/dL (ref 0.50–1.03)
Globulin: 2.2 g/dL (ref 1.9–3.7)
Glucose, Bld: 97 mg/dL (ref 65–99)
Potassium: 4.6 mmol/L (ref 3.5–5.3)
Sodium: 143 mmol/L (ref 135–146)
Total Bilirubin: 0.7 mg/dL (ref 0.2–1.2)
Total Protein: 6.3 g/dL (ref 6.1–8.1)

## 2024-09-26 LAB — CBC WITH DIFFERENTIAL/PLATELET
Absolute Lymphocytes: 2611 {cells}/uL (ref 850–3900)
Absolute Monocytes: 592 {cells}/uL (ref 200–950)
Basophils Absolute: 51 {cells}/uL (ref 0–200)
Basophils Relative: 0.5 %
Eosinophils Absolute: 337 {cells}/uL (ref 15–500)
Eosinophils Relative: 3.3 %
HCT: 44.9 % (ref 35.0–45.0)
Hemoglobin: 15.2 g/dL (ref 11.7–15.5)
MCH: 32.3 pg (ref 27.0–33.0)
MCHC: 33.9 g/dL (ref 32.0–36.0)
MCV: 95.5 fL (ref 80.0–100.0)
MPV: 11.3 fL (ref 7.5–12.5)
Monocytes Relative: 5.8 %
Neutro Abs: 6610 {cells}/uL (ref 1500–7800)
Neutrophils Relative %: 64.8 %
Platelets: 217 Thousand/uL (ref 140–400)
RBC: 4.7 Million/uL (ref 3.80–5.10)
RDW: 11.7 % (ref 11.0–15.0)
Total Lymphocyte: 25.6 %
WBC: 10.2 Thousand/uL (ref 3.8–10.8)

## 2024-09-26 LAB — TEST AUTHORIZATION

## 2024-09-26 LAB — LIPID PANEL
Cholesterol: 132 mg/dL (ref <200)
HDL: 55 mg/dL (ref >=50)
LDL Cholesterol (Calc): 59 mg/dL
Non-HDL Cholesterol (Calc): 77 mg/dL (ref <130)
Total CHOL/HDL Ratio: 2.4 (calc) (ref <5.0)
Triglycerides: 96 mg/dL (ref <150)

## 2024-09-26 LAB — HEMOGLOBIN A1C
Hgb A1c MFr Bld: 5.8 % — ABNORMAL HIGH (ref <5.7)
Mean Plasma Glucose: 120 mg/dL
eAG (mmol/L): 6.6 mmol/L

## 2024-09-26 LAB — TSH: TSH: 1.2 m[IU]/L (ref 0.40–4.50)

## 2024-09-26 LAB — HEPATITIS B SURFACE ANTIBODY,QUALITATIVE: Hep B S Ab: NONREACTIVE

## 2024-09-26 MED ORDER — HYDROCODONE-ACETAMINOPHEN 5-325 MG PO TABS
1.0000 | ORAL_TABLET | Freq: Two times a day (BID) | ORAL | 0 refills | Status: AC | PRN
  Filled 2024-10-11: qty 60, 30d supply, fill #0

## 2024-09-26 MED ORDER — HYDROCODONE-ACETAMINOPHEN 5-325 MG PO TABS
1.0000 | ORAL_TABLET | Freq: Two times a day (BID) | ORAL | 0 refills | Status: AC | PRN
  Filled 2024-11-09: qty 60, 30d supply, fill #0

## 2024-09-26 NOTE — Progress Notes (Signed)
 Patient presents to the office for a flu vaccine. Patient given flu vaccine in the left deltoid, patient tolerated well.

## 2024-09-27 ENCOUNTER — Ambulatory Visit

## 2024-09-30 ENCOUNTER — Encounter: Payer: Self-pay | Admitting: Internal Medicine

## 2024-09-30 NOTE — Patient Instructions (Addendum)
 Take Medrol  in tapering course as directed. Continue narcotic pain med per Dr. Cozetta. See Dr. Vernetta for hip pain.Continue Xanax  and Pristiq  for situational stress with father who is homebound.

## 2024-10-04 ENCOUNTER — Encounter: Payer: Self-pay | Admitting: Internal Medicine

## 2024-10-04 NOTE — Patient Instructions (Signed)
 It was good to see you today. Multiple medical issues discussed. Situational stress discussed. Continue current meds and return in 6 months or as needed. Suggest counseling for situational stress.

## 2024-10-06 ENCOUNTER — Other Ambulatory Visit: Payer: Self-pay | Admitting: Internal Medicine

## 2024-10-08 ENCOUNTER — Other Ambulatory Visit: Payer: Self-pay

## 2024-10-08 ENCOUNTER — Other Ambulatory Visit (HOSPITAL_COMMUNITY): Payer: Self-pay

## 2024-10-08 MED ORDER — DESVENLAFAXINE SUCCINATE ER 50 MG PO TB24
50.0000 mg | ORAL_TABLET | Freq: Every day | ORAL | 2 refills | Status: AC
  Filled 2024-10-08 – 2025-01-06 (×2): qty 90, 90d supply, fill #0

## 2024-10-11 ENCOUNTER — Other Ambulatory Visit (HOSPITAL_COMMUNITY): Payer: Self-pay

## 2024-10-18 ENCOUNTER — Other Ambulatory Visit (INDEPENDENT_AMBULATORY_CARE_PROVIDER_SITE_OTHER): Payer: Self-pay

## 2024-10-18 ENCOUNTER — Other Ambulatory Visit: Payer: Self-pay

## 2024-10-18 ENCOUNTER — Ambulatory Visit: Admitting: Orthopaedic Surgery

## 2024-10-18 DIAGNOSIS — G8929 Other chronic pain: Secondary | ICD-10-CM

## 2024-10-18 DIAGNOSIS — M5441 Lumbago with sciatica, right side: Secondary | ICD-10-CM

## 2024-10-18 DIAGNOSIS — M79604 Pain in right leg: Secondary | ICD-10-CM

## 2024-10-18 NOTE — Progress Notes (Signed)
 The patient is a 58 year old female that we have seen in the past about 3 years ago.  At that visit it was for left hip pain.  She is a regular patient of Dr. Ronal Amble Baxley and comes in with a history of right hip pain that has been going on since the summertime.  It is actually eased off after several rounds of the steroid but it was really waking her up at night and quite painful and even stopping her track she states.  Previous x-rays of the lumbar spine showed some degenerative disc disease between L5 and S1 with normal-appearing hips.  She denies any groin pain.  Most of her pain seems to be in the sciatic region but some of the proximal hip on the right side.  He is walking without a limp.  Her leg lengths are equal.  She has excellent range of motion of both hips and no blocks or rotation and really only mild pain to palpation of the right hip trochanteric area.  She does have low back pain are along the facet joints and into the sciatic region on the right side that goes down her leg.  2 views of the lumbar spine today show significant narrowing between L5 and S1 that is slightly worsened from x-rays 3 years ago.  Her hip joint spaces are well-maintained still.  This point we need to obtain an MRI of her lumbar spine to rule out nerve compression given her continued right sided sciatica.  She has tried but all forms conservative treatment including therapy and steroids so this will be the next step.  Will see her back in follow-up once we have this MRI.  She agrees with this treatment plan.

## 2024-10-19 ENCOUNTER — Encounter: Payer: Self-pay | Admitting: Orthopaedic Surgery

## 2024-10-23 ENCOUNTER — Encounter: Payer: Self-pay | Admitting: Internal Medicine

## 2024-10-24 ENCOUNTER — Ambulatory Visit: Attending: Cardiovascular Disease | Admitting: Cardiovascular Disease

## 2024-10-24 ENCOUNTER — Encounter: Payer: Self-pay | Admitting: Cardiovascular Disease

## 2024-10-24 ENCOUNTER — Ambulatory Visit
Admission: RE | Admit: 2024-10-24 | Discharge: 2024-10-24 | Disposition: A | Discharge status: Home / Self Care | Source: Ambulatory Visit | Attending: Orthopaedic Surgery | Admitting: Orthopaedic Surgery

## 2024-10-24 VITALS — BP 110/80 | HR 85 | Ht 65.0 in | Wt 180.6 lb

## 2024-10-24 DIAGNOSIS — I2510 Atherosclerotic heart disease of native coronary artery without angina pectoris: Secondary | ICD-10-CM | POA: Diagnosis not present

## 2024-10-24 DIAGNOSIS — E782 Mixed hyperlipidemia: Secondary | ICD-10-CM

## 2024-10-24 DIAGNOSIS — M47816 Spondylosis without myelopathy or radiculopathy, lumbar region: Secondary | ICD-10-CM | POA: Diagnosis not present

## 2024-10-24 DIAGNOSIS — M5126 Other intervertebral disc displacement, lumbar region: Secondary | ICD-10-CM | POA: Diagnosis not present

## 2024-10-24 DIAGNOSIS — M79604 Pain in right leg: Secondary | ICD-10-CM

## 2024-10-24 DIAGNOSIS — M48061 Spinal stenosis, lumbar region without neurogenic claudication: Secondary | ICD-10-CM | POA: Diagnosis not present

## 2024-10-24 DIAGNOSIS — R002 Palpitations: Secondary | ICD-10-CM | POA: Diagnosis not present

## 2024-10-24 DIAGNOSIS — G8929 Other chronic pain: Secondary | ICD-10-CM

## 2024-10-24 DIAGNOSIS — I10 Essential (primary) hypertension: Secondary | ICD-10-CM

## 2024-10-24 NOTE — Progress Notes (Signed)
 Cardiology Office Note:    Date:  10/24/2024   ID:  [name], DOB 15-Aug-1966, MRN [id number]  PCP:  Amy Ronal PARAS, MD   [facility] Providers Cardiologist:  Amy Fell, MD     Referring MD: Amy Ronal PARAS, MD   Chief Complaint  Patient presents with   Coronary Artery Disease    History of Present Illness:    [name] is a 58 y.o. female with a hx of coronary artery disease, presenting for follow-up evaluation.  Amy patient initially presented in 2013 with an anterior wall MI.  Cardiac catheterization demonstrated subtotal occlusion of a small diagonal branch with patency of Amy LAD and no other obstructive coronary disease.  LV function was within normal limits.  There was suspicion for coronary vasospasm.  Amy patient was treated medically with no recurrent ischemic events since that time.  Comorbid conditions include hypertension, tobacco use, gastroesophageal reflux disease, and hypothyroidism. Echo 2024 is normal with LVEF 60-65%, normal RV function, and no significant vascular disease. Cardiac monitor in 2022 showed no significant arrhythmia.   Amy patient is here alone today.  She has been under a lot of stress caring for her elderly father and administrating his power of attorney.  There are some family dynamic issues that have been difficult.  She complains of palpitations especially at times of high stress.  She otherwise is doing well and has no chest pain, chest pressure, or shortness of breath.  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  She is compliant with her medications.  We reviewed her cardiac medication regimen today and she confirms that she takes amlodipine , aspirin , atorvastatin , and enalapril  as directed.   Current Medications: Current Meds  Medication Sig   albuterol  (VENTOLIN  HFA) 108 (90 Base) MCG/ACT inhaler Inhale 1-2 puffs into Amy lungs every 6 (six) hours as needed.   ALPRAZolam  (XANAX ) 0.5 MG  tablet Take 1 tablet (0.5 mg total) by mouth 2 (two) times daily as needed for anxiety.   amLODipine  (NORVASC ) 5 MG tablet Take 1 tablet (5 mg total) by mouth daily.   aspirin  EC 81 MG EC tablet Take 1 tablet (81 mg total) by mouth daily.   atorvastatin  (LIPITOR) 40 MG tablet Take 1 tablet (40 mg total) by mouth daily at 6 PM.   calcium -vitamin D  (OSCAL WITH D) 500-200 MG-UNIT per tablet Take 1 tablet by mouth daily.   Cholecalciferol (VITAMIN D3) 2000 units TABS Take 2,000 Units by mouth daily.   desvenlafaxine  (PRISTIQ ) 50 MG 24 hr tablet Take 1 tablet (50 mg total) by mouth daily.   enalapril  (VASOTEC ) 10 MG tablet Take 1 tablet (10 mg total) by mouth daily.   [START ON 10/25/2024] HYDROcodone -acetaminophen  (NORCO/VICODIN) 5-325 MG tablet Take 1 tablet by mouth twice a day as needed for pain   HYDROcodone -acetaminophen  (NORCO/VICODIN) 5-325 MG tablet Take 1 tablet by mouth twice a day as needed for pain   ketoconazole  (NIZORAL ) 2 % cream Apply 1 application topically daily.   levothyroxine  (SYNTHROID ) 75 MCG tablet Take 1 tablet (75 mcg total) by mouth daily.   loratadine (CLARITIN) 10 MG tablet Take 10 mg by mouth daily.   naloxone  (NARCAN ) nasal spray 4 mg/0.1 mL 1 spray into one nostril as a single dose as needed may repeat dose every 2-3 min until patient responsive or EMS arrives   nitroGLYCERIN  (NITROSTAT ) 0.4 MG SL tablet Dissolve 1 tablet under Amy tongue every 5 minutes as needed for  chest pain. Max of 3 doses, then 911.   pantoprazole  (PROTONIX ) 40 MG tablet Take 1 tablet (40 mg total) by mouth 2 (two) times daily before a meal.   Tiotropium Bromide-Olodaterol (STIOLTO RESPIMAT ) 2.5-2.5 MCG/ACT AERS Inhale 2 puffs into Amy lungs daily.   tiZANidine  (ZANAFLEX ) 2 MG tablet Take 1 tablet (2 mg total) by mouth 3 (three) times daily as needed.   [DISCONTINUED] HYDROcodone -acetaminophen  (NORCO/VICODIN) 5-325 MG tablet Take 1 tablet by mouth twice a day as needed for pain   [DISCONTINUED]  HYDROcodone -acetaminophen  (NORCO/VICODIN) 5-325 MG tablet Take 1 tablet by mouth twice a day as needed for pain   [DISCONTINUED] HYDROcodone -acetaminophen  (NORCO/VICODIN) 5-325 MG tablet Take 1 tablet by mouth twice a day as needed for pain   [DISCONTINUED] HYDROcodone -acetaminophen  (NORCO/VICODIN) 5-325 MG tablet Take 1 tablet by mouth 2 (two) times daily as needed for pain.   [DISCONTINUED] HYDROcodone -acetaminophen  (NORCO/VICODIN) 5-325 MG tablet Take 1 tablet by mouth 2 (two) times daily as needed for pain.   [DISCONTINUED] HYDROcodone -acetaminophen  (NORCO/VICODIN) 5-325 MG tablet Take 1 tablet by mouth twice a day as needed for pain   [DISCONTINUED] HYDROcodone -acetaminophen  (NORCO/VICODIN) 5-325 MG tablet Take 1 tablet by mouth twice a day as needed for pain   [DISCONTINUED] HYDROcodone -acetaminophen  (NORCO/VICODIN) 5-325 MG tablet Take 1 tablet by mouth twice a day as needed for pain.   [DISCONTINUED] HYDROcodone -acetaminophen  (NORCO/VICODIN) 5-325 MG tablet Take 1 tablet by mouth twice a day as needed for pain   [DISCONTINUED] HYDROcodone -acetaminophen  (NORCO/VICODIN) 5-325 MG tablet Take 1 tablet by mouth twice a day as needed for pain   [DISCONTINUED] HYDROcodone -acetaminophen  (NORCO/VICODIN) 5-325 MG tablet Take 1 tablet by mouth twice a day as needed for pain   [DISCONTINUED] HYDROcodone -acetaminophen  (NORCO/VICODIN) 5-325 MG tablet Take 1 tablet by mouth twice a day as needed for pain   [DISCONTINUED] HYDROcodone -acetaminophen  (NORCO/VICODIN) 5-325 MG tablet Take 1 tablet by mouth twice a day as needed for pain     Allergies:   Levaquin [levofloxacin] and Nsaids   ROS:   Please see Amy history of present illness.    All other systems reviewed and are negative.  EKGs/Labs/Other Studies Reviewed:    Amy following studies were reviewed today: Cardiac Studies & Procedures   ______________________________________________________________________________________________      ECHOCARDIOGRAM  ECHOCARDIOGRAM COMPLETE 04/07/2023  Narrative ECHOCARDIOGRAM REPORT    Patient Name:   [name] Date of Exam: 04/07/2023 Medical Rec #:  [id number]                   Height:       65.0 in Accession #:    [id number]                  Weight:       191.4 lb Date of Birth:  1966/08/01                   BSA:          1.942 m Patient Age:    56 years                    BP:           120/80 mmHg Patient Gender: F                           HR:           75 bpm. Exam Location:  [address]  Procedure: 2D  Echo, 3D Echo, Cardiac Doppler, Color Doppler and Strain Analysis  Indications:    R00.2 Palpitations  History:        Patient has prior history of Echocardiogram examinations, most recent 11/23/2012. Previous Myocardial Infarction and CAD; Risk Factors:Hypertension and Current Smoker.  Sonographer:    Carl Rodgers-Jones RDCS Referring Phys: [id number]  IMPRESSIONS   1. Left ventricular ejection fraction, by estimation, is 60 to 65%. Amy left ventricle has normal function. Amy left ventricle has no regional wall motion abnormalities. Left ventricular diastolic parameters were normal. Amy average left ventricular global longitudinal strain is -23.9 %. Amy global longitudinal strain is normal. 2. Right ventricular systolic function is normal. Amy right ventricular size is normal. 3. Amy mitral valve is normal in structure. No evidence of mitral valve regurgitation. No evidence of mitral stenosis. 4. Amy aortic valve is tricuspid. Aortic valve regurgitation is not visualized. No aortic stenosis is present. 5. Amy inferior vena cava is normal in size with greater than 50% respiratory variability, suggesting right atrial pressure of 3 mmHg.  FINDINGS Left Ventricle: Left ventricular ejection fraction, by estimation, is 60 to 65%. Amy left ventricle has normal function. Amy left ventricle has no regional wall motion abnormalities. Amy average  left ventricular global longitudinal strain is -23.9 %. Amy global longitudinal strain is normal. Amy left ventricular internal cavity size was normal in size. There is no left ventricular hypertrophy. Left ventricular diastolic parameters were normal. Indeterminate filling pressures.  Right Ventricle: Amy right ventricular size is normal. No increase in right ventricular wall thickness. Right ventricular systolic function is normal.  Left Atrium: Left atrial size was normal in size.  Right Atrium: Right atrial size was normal in size.  Pericardium: There is no evidence of pericardial effusion.  Mitral Valve: Amy mitral valve is normal in structure. No evidence of mitral valve regurgitation. No evidence of mitral valve stenosis.  Tricuspid Valve: Amy tricuspid valve is normal in structure. Tricuspid valve regurgitation is not demonstrated. No evidence of tricuspid stenosis.  Aortic Valve: Amy aortic valve is tricuspid. Aortic valve regurgitation is not visualized. No aortic stenosis is present.  Pulmonic Valve: Amy pulmonic valve was normal in structure. Pulmonic valve regurgitation is not visualized. No evidence of pulmonic stenosis.  Aorta: Amy aortic root is normal in size and structure.  Venous: Amy inferior vena cava is normal in size with greater than 50% respiratory variability, suggesting right atrial pressure of 3 mmHg.  IAS/Shunts: No atrial level shunt detected by color flow Doppler.   LEFT VENTRICLE PLAX 2D LVIDd:         4.20 cm   Diastology LVIDs:         2.60 cm   LV e' medial:    9.94 cm/s LV PW:         0.80 cm   LV E/e' medial:  9.9 LV IVS:        0.80 cm   LV e' lateral:   11.85 cm/s LVOT diam:     2.00 cm   LV E/e' lateral: 8.3 LV SV:         88 LV SV Index:   46        2D Longitudinal Strain LVOT Area:     3.14 cm  2D Strain GLS (A2C):   -22.2 % 2D Strain GLS (A3C):   -23.7 % 2D Strain GLS (A4C):   -25.7 % 2D Strain GLS Avg:     -23.9 %  3D Volume EF:  3D  EF:        55 % LV EDV:       142 ml LV ESV:       63 ml LV SV:        79 ml  RIGHT VENTRICLE             IVC RV Basal diam:  3.50 cm     IVC diam: 1.20 cm RV S prime:     14.73 cm/s TAPSE (M-mode): 2.4 cm  LEFT ATRIUM             Index        RIGHT ATRIUM          Index LA diam:        3.50 cm 1.80 cm/m   RA Area:     7.85 cm LA Vol (A2C):   31.9 ml 16.43 ml/m  RA Volume:   15.40 ml 7.93 ml/m LA Vol (A4C):   26.8 ml 13.80 ml/m LA Biplane Vol: 30.2 ml 15.55 ml/m AORTIC VALVE LVOT Vmax:   145.00 cm/s LVOT Vmean:  95.633 cm/s LVOT VTI:    0.282 m  AORTA Ao Root diam: 3.00 cm  MITRAL VALVE MV Area (PHT): 4.06 cm    SHUNTS MV Decel Time: 187 msec    Systemic VTI:  0.28 m MV E velocity: 98.50 cm/s  Systemic Diam: 2.00 cm MV A velocity: 79.90 cm/s MV E/A ratio:  1.23  Annabella Scarce MD Electronically signed by Annabella Scarce MD Signature Date/Time: 04/08/2023/10:59:00 AM    Final    MONITORS  CARDIAC EVENT MONITOR 03/05/2021  Narrative 1. Amy basic rhythm is normal sinus with an average HR of 80 bpm 2. No atrial fibrillation or flutter 3. No high-grade heart block or pathologic pauses 4. There are rare PVC's and occasional supraventricular beats without sustained arrhythmias       ______________________________________________________________________________________________      EKG:        Recent Labs: 09/24/2024: ALT 23; BUN 9; Creat 0.77; Hemoglobin 15.2; Platelets 217; Potassium 4.6; Sodium 143; TSH 1.20  Recent Lipid Panel    Component Value Date/Time   CHOL 132 09/24/2024 0936   TRIG 96 09/24/2024 0936   HDL 55 09/24/2024 0936   CHOLHDL 2.4 09/24/2024 0936   VLDL 23 06/21/2017 1014   LDLCALC 59 09/24/2024 0936     Risk Assessment/Calculations:                Physical Exam:    VS:  BP 110/80 (BP Location: Left Arm, Patient Position: Sitting, Cuff Size: Normal)   Pulse 85   Ht 5' 5 (1.651 m)   Wt 180 lb 9.6 oz (81.9 kg)    SpO2 98%   BMI 30.05 kg/m     Wt Readings from Last 3 Encounters:  10/24/24 180 lb 9.6 oz (81.9 kg)  09/26/24 183 lb (83 kg)  09/25/24 183 lb (83 kg)     GEN:  Well nourished, well developed in no acute distress HEENT: Normal NECK: No JVD; No carotid bruits LYMPHATICS: No lymphadenopathy CARDIAC: RRR, no murmurs, rubs, gallops RESPIRATORY:  Clear to auscultation without rales, wheezing or rhonchi  ABDOMEN: Soft, non-tender, non-distended MUSCULOSKELETAL:  No edema; No deformity  SKIN: Warm and dry NEUROLOGIC:  Alert and oriented x 3 PSYCHIATRIC:  Normal affect   Assessment & Plan Atherosclerosis of native coronary artery of native heart without angina pectoris Stable without symptoms of angina.  Continue aspirin  and statin drug. Essential  hypertension Blood pressure is well-controlled on amlodipine  and enalapril .  Continue Amy same. Mixed hyperlipidemia Lipids are excellent on atorvastatin  with an LDL cholesterol of 59 and an HDL of 55.  Recent lipid panel reviewed from 09/24/2024.  ALT is normal at 23. Palpitations Chronic, stable.  We discussed Amy idea of outpatient telemetry monitoring and she declined.  Continue observation and she can reach out if symptoms worsen.     Medication Adjustments/Labs and Tests Ordered: Current medicines are reviewed at length with Amy patient today.  Concerns regarding medicines are outlined above.  No orders of Amy defined types were placed in this encounter.  No orders of Amy defined types were placed in this encounter.   Patient Instructions  Medication Instructions:  No medication changes were made at this visit. Continue current regimen.   *If you need a refill on your cardiac medications before your next appointment, please call your pharmacy*  Lab Work: None ordered today. If you have labs (blood work) drawn today and your tests are completely normal, you will receive your results only by: MyChart Message (if you have MyChart)  OR A paper copy in Amy mail If you have any lab test that is abnormal or we need to change your treatment, we will call you to review Amy results.  Testing/Procedures: None ordered today.  Follow-Up: At [facility], you and your health needs are our priority.  As part of our continuing mission to provide you with exceptional heart care, our providers are all part of one team.  This team includes your primary Cardiologist (physician) and Advanced Practice Providers or APPs (Physician Assistants and Nurse Practitioners) who all work together to provide you with Amy care you need, when you need it.  Your next appointment:   1 year(s)  Provider:   Ozell Fell, MD      Signed, Amy Fell, MD  10/24/2024 1:10 PM    [facility]

## 2024-10-24 NOTE — Patient Instructions (Signed)

## 2024-11-06 ENCOUNTER — Encounter: Payer: Self-pay | Admitting: Internal Medicine

## 2024-11-08 ENCOUNTER — Other Ambulatory Visit (HOSPITAL_COMMUNITY): Payer: Self-pay

## 2024-11-09 ENCOUNTER — Other Ambulatory Visit (HOSPITAL_COMMUNITY): Payer: Self-pay

## 2024-11-19 ENCOUNTER — Ambulatory Visit: Admitting: Orthopaedic Surgery

## 2024-11-19 DIAGNOSIS — G8929 Other chronic pain: Secondary | ICD-10-CM | POA: Diagnosis not present

## 2024-11-19 DIAGNOSIS — M5441 Lumbago with sciatica, right side: Secondary | ICD-10-CM | POA: Diagnosis not present

## 2024-11-19 NOTE — Progress Notes (Signed)
 The patient comes in after having a MRI of her lumbar spine.  She was having right sided sciatica and low back pain and it failed conservative treatment.  The MRI of her lumbar spine does show a disc bulge at L5-S1 to the right side with a small osteophyte.  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  On exam she does have low back pain to the right side and a positive straight leg raise but good strength overall.  It is worth sending her to physical therapy for McKenzie exercises and traction to see if this would help with the lumbar spine before pursuing any type of epidural steroid injection.  I talked to her about treatment options and considering therapy versus epidural or both and we both felt that it is worth trying physical therapy first.  Will set her up for that and then see her back in 6 weeks.  During her course of treatment if things worsen she will let us  know and she will also let us  know if we need to go ahead and pursue an steroid injection but only if needed.  This would be an epidural steroid to the right side at L5-S1.  Regardless, we will see her back in 6 weeks to see how she doing overall.

## 2024-11-20 ENCOUNTER — Other Ambulatory Visit: Payer: Self-pay

## 2024-11-20 DIAGNOSIS — G8929 Other chronic pain: Secondary | ICD-10-CM

## 2024-11-20 DIAGNOSIS — M79604 Pain in right leg: Secondary | ICD-10-CM

## 2024-11-21 ENCOUNTER — Other Ambulatory Visit (HOSPITAL_COMMUNITY): Payer: Self-pay

## 2024-11-21 DIAGNOSIS — M2550 Pain in unspecified joint: Secondary | ICD-10-CM | POA: Diagnosis not present

## 2024-11-21 DIAGNOSIS — M542 Cervicalgia: Secondary | ICD-10-CM | POA: Diagnosis not present

## 2024-11-21 DIAGNOSIS — G894 Chronic pain syndrome: Secondary | ICD-10-CM | POA: Diagnosis not present

## 2024-11-21 DIAGNOSIS — M47817 Spondylosis without myelopathy or radiculopathy, lumbosacral region: Secondary | ICD-10-CM | POA: Diagnosis not present

## 2024-11-21 MED ORDER — HYDROCODONE-ACETAMINOPHEN 5-325 MG PO TABS
1.0000 | ORAL_TABLET | Freq: Two times a day (BID) | ORAL | 0 refills | Status: AC | PRN
  Filled 2025-01-11: qty 60, 30d supply, fill #0

## 2024-11-21 MED ORDER — HYDROCODONE-ACETAMINOPHEN 5-325 MG PO TABS
1.0000 | ORAL_TABLET | Freq: Two times a day (BID) | ORAL | 0 refills | Status: AC | PRN
  Filled 2024-12-12: qty 60, 30d supply, fill #0

## 2024-12-04 ENCOUNTER — Encounter: Payer: Self-pay | Admitting: Orthopaedic Surgery

## 2024-12-12 ENCOUNTER — Other Ambulatory Visit (HOSPITAL_COMMUNITY): Payer: Self-pay

## 2024-12-25 ENCOUNTER — Other Ambulatory Visit: Payer: Self-pay

## 2024-12-25 ENCOUNTER — Other Ambulatory Visit (HOSPITAL_COMMUNITY): Payer: Self-pay

## 2024-12-26 ENCOUNTER — Other Ambulatory Visit (HOSPITAL_COMMUNITY): Payer: Self-pay

## 2024-12-26 MED ORDER — RSVPREF3 VAC RECOMB ADJUVANTED 120 MCG/0.5ML IM SUSR
0.5000 mL | Freq: Once | INTRAMUSCULAR | 0 refills | Status: AC
  Filled 2024-12-26 – 2025-01-11 (×3): qty 0.5, 1d supply, fill #0

## 2024-12-31 ENCOUNTER — Ambulatory Visit: Admitting: Orthopaedic Surgery

## 2025-01-06 ENCOUNTER — Other Ambulatory Visit (HOSPITAL_COMMUNITY): Payer: Self-pay

## 2025-01-11 ENCOUNTER — Other Ambulatory Visit (HOSPITAL_COMMUNITY): Payer: Self-pay

## 2025-03-28 ENCOUNTER — Ambulatory Visit: Payer: Self-pay | Admitting: Internal Medicine
